# Patient Record
Sex: Male | Born: 1940 | Race: White | Hispanic: No | Marital: Married | Smoking: Former smoker
Health system: Southern US, Community
[De-identification: ages and names within clinical notes are randomized; demographics above are authoritative.]

## PROBLEM LIST (undated history)

## (undated) DIAGNOSIS — Z87442 Personal history of urinary calculi: Secondary | ICD-10-CM

## (undated) DIAGNOSIS — C679 Malignant neoplasm of bladder, unspecified: Secondary | ICD-10-CM

## (undated) DIAGNOSIS — I10 Essential (primary) hypertension: Secondary | ICD-10-CM

## (undated) DIAGNOSIS — K08109 Complete loss of teeth, unspecified cause, unspecified class: Secondary | ICD-10-CM

## (undated) DIAGNOSIS — N189 Chronic kidney disease, unspecified: Secondary | ICD-10-CM

## (undated) DIAGNOSIS — I2510 Atherosclerotic heart disease of native coronary artery without angina pectoris: Secondary | ICD-10-CM

## (undated) DIAGNOSIS — N183 Chronic kidney disease, stage 3 unspecified: Secondary | ICD-10-CM

## (undated) DIAGNOSIS — Z85828 Personal history of other malignant neoplasm of skin: Secondary | ICD-10-CM

## (undated) DIAGNOSIS — D509 Iron deficiency anemia, unspecified: Secondary | ICD-10-CM

## (undated) DIAGNOSIS — E119 Type 2 diabetes mellitus without complications: Secondary | ICD-10-CM

## (undated) DIAGNOSIS — C801 Malignant (primary) neoplasm, unspecified: Secondary | ICD-10-CM

## (undated) DIAGNOSIS — E785 Hyperlipidemia, unspecified: Secondary | ICD-10-CM

## (undated) DIAGNOSIS — N200 Calculus of kidney: Secondary | ICD-10-CM

## (undated) DIAGNOSIS — Z973 Presence of spectacles and contact lenses: Secondary | ICD-10-CM

## (undated) DIAGNOSIS — Z8719 Personal history of other diseases of the digestive system: Secondary | ICD-10-CM

## (undated) DIAGNOSIS — I219 Acute myocardial infarction, unspecified: Secondary | ICD-10-CM

## (undated) DIAGNOSIS — N401 Enlarged prostate with lower urinary tract symptoms: Secondary | ICD-10-CM

## (undated) DIAGNOSIS — H409 Unspecified glaucoma: Secondary | ICD-10-CM

## (undated) DIAGNOSIS — Z8739 Personal history of other diseases of the musculoskeletal system and connective tissue: Secondary | ICD-10-CM

## (undated) DIAGNOSIS — Z9889 Other specified postprocedural states: Secondary | ICD-10-CM

## (undated) HISTORY — DX: Hyperlipidemia, unspecified: E78.5

## (undated) HISTORY — DX: Type 2 diabetes mellitus without complications: E11.9

## (undated) HISTORY — PX: OTHER SURGICAL HISTORY: SHX169

## (undated) HISTORY — DX: Calculus of kidney: N20.0

## (undated) HISTORY — DX: Essential (primary) hypertension: I10

## (undated) HISTORY — DX: Chronic kidney disease, unspecified: N18.9

## (undated) HISTORY — DX: Atherosclerotic heart disease of native coronary artery without angina pectoris: I25.10

## (undated) HISTORY — PX: CARDIAC CATHETERIZATION: SHX172

---
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
## 2002-04-18 ENCOUNTER — Encounter: Payer: Self-pay | Admitting: Internal Medicine

## 2002-04-18 ENCOUNTER — Ambulatory Visit (HOSPITAL_COMMUNITY): Admission: RE | Admit: 2002-04-18 | Discharge: 2002-04-18 | Payer: Self-pay | Admitting: Internal Medicine

## 2002-08-01 ENCOUNTER — Inpatient Hospital Stay (HOSPITAL_COMMUNITY): Admission: EM | Admit: 2002-08-01 | Discharge: 2002-08-06 | Payer: Self-pay | Admitting: Emergency Medicine

## 2002-08-01 ENCOUNTER — Encounter: Payer: Self-pay | Admitting: Emergency Medicine

## 2002-08-01 DIAGNOSIS — I252 Old myocardial infarction: Secondary | ICD-10-CM

## 2002-08-01 HISTORY — DX: Old myocardial infarction: I25.2

## 2002-08-02 DIAGNOSIS — Z955 Presence of coronary angioplasty implant and graft: Secondary | ICD-10-CM

## 2002-08-02 HISTORY — DX: Presence of coronary angioplasty implant and graft: Z95.5

## 2002-08-03 HISTORY — PX: CARDIAC CATHETERIZATION: SHX172

## 2002-08-05 HISTORY — PX: CARDIAC CATHETERIZATION: SHX172

## 2002-10-04 HISTORY — PX: CARDIAC CATHETERIZATION: SHX172

## 2003-06-29 ENCOUNTER — Ambulatory Visit (HOSPITAL_COMMUNITY): Admission: RE | Admit: 2003-06-29 | Discharge: 2003-06-29 | Payer: Self-pay | Admitting: Urology

## 2003-07-05 ENCOUNTER — Ambulatory Visit (HOSPITAL_BASED_OUTPATIENT_CLINIC_OR_DEPARTMENT_OTHER): Admission: RE | Admit: 2003-07-05 | Discharge: 2003-07-05 | Payer: Self-pay | Admitting: Urology

## 2003-07-07 ENCOUNTER — Emergency Department (HOSPITAL_COMMUNITY): Admission: EM | Admit: 2003-07-07 | Discharge: 2003-07-07 | Payer: Self-pay | Admitting: Emergency Medicine

## 2003-07-13 ENCOUNTER — Ambulatory Visit (HOSPITAL_COMMUNITY): Admission: RE | Admit: 2003-07-13 | Discharge: 2003-07-13 | Payer: Self-pay | Admitting: Urology

## 2003-09-01 ENCOUNTER — Ambulatory Visit (HOSPITAL_BASED_OUTPATIENT_CLINIC_OR_DEPARTMENT_OTHER): Admission: RE | Admit: 2003-09-01 | Discharge: 2003-09-01 | Payer: Self-pay | Admitting: Urology

## 2004-06-05 ENCOUNTER — Ambulatory Visit (HOSPITAL_COMMUNITY): Admission: RE | Admit: 2004-06-05 | Discharge: 2004-06-05 | Payer: Self-pay | Admitting: Internal Medicine

## 2005-01-01 ENCOUNTER — Ambulatory Visit (HOSPITAL_COMMUNITY): Admission: RE | Admit: 2005-01-01 | Discharge: 2005-01-01 | Payer: Self-pay | Admitting: *Deleted

## 2005-01-09 ENCOUNTER — Ambulatory Visit (HOSPITAL_COMMUNITY): Admission: RE | Admit: 2005-01-09 | Discharge: 2005-01-09 | Payer: Self-pay | Admitting: Cardiology

## 2005-08-04 ENCOUNTER — Emergency Department (HOSPITAL_COMMUNITY): Admission: EM | Admit: 2005-08-04 | Discharge: 2005-08-04 | Payer: Self-pay | Admitting: Emergency Medicine

## 2006-03-30 HISTORY — PX: TRANSTHORACIC ECHOCARDIOGRAM: SHX275

## 2009-09-07 HISTORY — PX: CARDIOVASCULAR STRESS TEST: SHX262

## 2009-09-07 HISTORY — PX: OTHER SURGICAL HISTORY: SHX169

## 2011-11-11 ENCOUNTER — Ambulatory Visit (HOSPITAL_COMMUNITY)
Admission: RE | Admit: 2011-11-11 | Discharge: 2011-11-11 | Disposition: A | Discharge status: Home / Self Care | Payer: Medicare Other | Source: Ambulatory Visit | Attending: Orthopedic Surgery | Admitting: Orthopedic Surgery

## 2011-11-11 DIAGNOSIS — IMO0001 Reserved for inherently not codable concepts without codable children: Secondary | ICD-10-CM | POA: Insufficient documentation

## 2011-11-11 DIAGNOSIS — M6281 Muscle weakness (generalized): Secondary | ICD-10-CM | POA: Insufficient documentation

## 2011-11-11 DIAGNOSIS — M25669 Stiffness of unspecified knee, not elsewhere classified: Secondary | ICD-10-CM | POA: Insufficient documentation

## 2011-11-11 DIAGNOSIS — M25569 Pain in unspecified knee: Secondary | ICD-10-CM | POA: Insufficient documentation

## 2011-11-11 NOTE — Evaluation (Addendum)
Physical Therapy Evaluation  Patient Details  Name: Craig Taylor MRN: [id number] Date of Birth: 08/13/1940  Today's Date: 11/11/2011 Time: 7829-5621 Time Calculation (min): 47 min Charges: 1 eval Visit#: 1  of 7   Re-eval: 12/04/11 (G Code: CK, Goal: CI) Assessment Diagnosis: Rt knee pain Next MD Visit: 3 weeks  Subjective Symptoms/Limitations Symptoms: Pt is referred to to PT secondary to an LCL injury.  He reports that the last week in February he twisted his knee.  Pain Range 0-9/10.  Alleviating: Advil, heat.  Aggrevating: Movement,  Nature: Throbbing, occasional sharp pains with quick movments.  How long can you sit comfortably?: Able to sit as long as he can straighten it out.  Has pain when switching it over to the brake.  How long can you stand comfortably?: With Advil: 1 hour, without advil 10 minutes How long can you walk comfortably?: Takes him awhile to start moving.  walk about 10 minutes  Special Tests: RLE: Lax end feel with Varus stress, Unable to relax to perform mcmurrary's or lachmas.  Pt reports that he had an MRI and x-ray and both where normal.  Pain Assessment Currently in Pain?: Yes Pain Score:   6 Pain Location: Knee Pain Orientation: Right Pain Type: Acute pain  Prior Functioning  Home Living Lives With: Spouse Home Layout: Two level Alternate Level Stairs-Number of Steps: 15  Prior Function Able to Take Stairs?: Yes (step to pattern) Leisure: Hobbies-yes (Comment) Comments: He enjoys hunting, fishing and other outdoor activities.   Cognition/Observation Observation/Other Assessments Observations: Poor motor control to gluteus medius, has increased gastroc and hamstring activation when attmepting to use R gluteus medius.   Favors RLE when going from Sit <> Stand  Sensation/Coordination/Flexibility/Functional Tests Functional Tests Functional Tests: Lower Extremity Functional Scale (LEFS): 28/80.  Assessment RLE AROM (degrees) Right Knee  Extension: 7  Right Knee Flexion: 125  RLE Strength Right Hip Flexion: 4/5; Extension: 3/5; ABduction: 3/5; ADduction: 3+/5 Right Knee Flexion: 4/5; Extension: 3+/5 Palpation Palpation: Pain and tenderness over R LCL.  Lax end feel with varus stress test.   Mobility/Balance  Ambulation/Gait Ambulation/Gait: Yes Gait Pattern: Antalgic;Lateral trunk lean to right;Right foot flat Static Standing Balance Single Leg Stance - Right Leg: 2  (impaired ankle strategy. Decreased awareness) Single Leg Stance - Left Leg: 3  Tandem Stance - Right Leg: 6  (impaired ankle strategy. favoring R LE) Tandem Stance - Left Leg: 8  (impaired ankle strategy.) Rhomberg - Eyes Closed: 10  (increased swa4) \  Exercise/Treatments Supine Quad Sets: 5 reps (10 sec holds) Straight Leg Raises:  (increased pain when attempting) Sidelying Clams: L S/L.  NMR to improve gluteus medius activiation using TC's and VC's.  After established able to complete 2x10 sec   Physical Therapy Assessment and Plan PT Assessment and Plan Clinical Impression Statement: Pt is referred to PT secondary to L LCL strain.  After examiniation it was found he has current impairments including increased knee pain, decreased leg strength, decreased L knee AROM, decreased hip/leg motor control, impaired balance and impaired percieved functional ability which is limiting him in participating in his ADL's and IADL's including outdoor activities.  Pt will benefit from skilled OP PT in order to address above impairments in order to reach functional goals.   Rehab Potential: Good PT Frequency: Min 2X/week PT Duration:  (3 weeks (7 visits)) PT Treatment/Interventions: Gait training;Stair training;Functional mobility training;Therapeutic activities;Therapeutic exercise;Neuromuscular re-education;Patient/family education;Other (comment) (Manual techniques, Heat/Cold modalities PRN) PT Plan: Bike for Warm up and  ROM, focus on gait training, glutes  medius activation, functional squats, heel/toe raises. stair training when able and has decreased pain.    Goals Home Exercise Program Pt will Perform Home Exercise Program: Independently PT Short Term Goals Time to Complete Short Term Goals: 3 weeks PT Short Term Goal 1: Pt will report pain less than 4/10 for 50% of his day.  PT Short Term Goal 2: Pt will improve his balance and deomonstrate R SLS with proper mechanics on static surface x20 sec PT Short Term Goal 3: Pt will improve his leg strength to 5/5 throughout in order to tolerate walking for 30 minutes on uneven surfaces in order to return to hunting.  PT Short Term Goal 4: Pt will improve leg AROM 0-125 in order to ambulate with proper gait mechanics.  PT Short Term Goal 5: Pt will improve his functional strength and demonstrate ascending and descending 15 stairs with 1 handrail with reciprocal pattern.  Additional PT Short Term Goals?: Yes PT Short Term Goal 6: Pt will improve his LEFS score to 40/80 for improved percieved functional ability.   Problem List Patient Active Problem List  Diagnoses  . Pain in joint, lower leg  . Stiffness of joint, not elsewhere classified, lower leg    PT Plan of Care PT Home Exercise Plan: Clams 2x10 sec holds, Quad Sets 10x10 sec holds.  Educated pt to perform heel slides for 30 sec-1 minute before getting up to improve synovial fluid.  Consulted and Agree with Plan of Care: Patient  GP  Functional Reporting Modifier  Current Status  [phone] - Mobility: Walking & Moving Around CK - At least 40% but less than 60% impaired, limited or restricted  Goal Status  [id number] - Mobility: [place] & Moving Around CI - At least 1% but less than 20% impaired, limited or restricted   Amilliana Hayworth 11/11/2011, 12:15 PM  Physician Documentation Your signature is required to indicate approval of the treatment plan as stated above.  Please sign and either send electronically or make a copy of this report for your  files and return this physician signed original.   Please mark one 1.__approve of plan  2. ___approve of plan with the following conditions.   ______________________________                                                          _____________________ Physician Signature                                                                                                             Date

## 2011-11-18 ENCOUNTER — Ambulatory Visit (HOSPITAL_COMMUNITY)
Admission: RE | Admit: 2011-11-18 | Discharge: 2011-11-18 | Disposition: A | Discharge status: Home / Self Care | Payer: Medicare Other | Source: Ambulatory Visit | Attending: Orthopedic Surgery | Admitting: Orthopedic Surgery

## 2011-11-18 NOTE — Progress Notes (Signed)
Physical Therapy Treatment Patient Details  Name: Craig Taylor MRN: [id number] Date of Birth: Oct 15, 1940  Today's Date: 11/18/2011 Time: 9629-5284 Time Calculation (min): 45 min Visit#: 2  of 7   Re-eval: 12/04/11 Charges: therex 25', estim X 1 unit, MHP X 1 unit    Subjective: Symptoms/Limitations Symptoms: Pt. reports he is in severe pain today, 8/10 lateral R knee and has increased antalgia.  Pt reports he has been using heat to decrease his pain. Pain Assessment Currently in Pain?: Yes Pain Score:   8 Pain Location: Knee Pain Orientation: Right;Lateral  Exercises Instructed by Trilby Leaver, SPTA under the direction of Darragh Nay Bascom Levels, PTA/CI. Exercise/Treatments Supine Quad Sets: 10 reps;Limitations Quad Sets Limitations: assist to concentric and hold Sidelying Clams: L S/L.  NMR to improve gluteus medius activiation using TC's and VC's.  After established able to complete 2x10 sec  Prone  Hamstring Curl: 10 reps Hip Extension: 10 reps   Modalities Modalities: Electrical Stimulation;Moist Heat Manual Therapy Manual Therapy: Joint mobilization Joint Mobilization: patellar and fibular head mobs Museum/gallery exhibitions officer Stimulation Location: R knee, IFES Electrical Stimulation Action: IFES to decrease pain, hi/lo sweep intensity 30 for 10 minutes with MHP Electrical Stimulation Goals: Pain  Physical Therapy Assessment and Plan PT Assessment and Plan Clinical Impression Statement: Pt. with increased pain since yesterday without known cause.  Encouraged pt. to use AD while pain is high/decrease antalgia.  Held standing exercises due to high pain.  Pt. able to complete all mat activities.  Pt. with most difficulty with quad sets, very weak/atrophy of R quad.  Added IFES with heat (works better for patient) to help decrease pain.  Pain 3/10 after session. PT Plan: Add standing exercises next visit if pain is decreased; Ensure pt. is using AD and peforming  correct gait sequence.     PT - End of Session Activity Tolerance: Patient tolerated treatment well General Behavior During Session: [facility] for tasks performed Cognition: [facility] for tasks performed   Brittain Smithey B. Bascom Levels, PTA 11/18/2011, 10:53 AM

## 2011-11-21 ENCOUNTER — Telehealth (HOSPITAL_COMMUNITY): Payer: Self-pay | Admitting: Physical Therapy

## 2011-11-21 ENCOUNTER — Ambulatory Visit (HOSPITAL_COMMUNITY): Payer: Medicare Other | Admitting: Physical Therapy

## 2011-11-25 ENCOUNTER — Ambulatory Visit (HOSPITAL_COMMUNITY)
Admission: RE | Admit: 2011-11-25 | Discharge: 2011-11-25 | Disposition: A | Discharge status: Home / Self Care | Payer: Medicare Other | Source: Ambulatory Visit | Attending: Internal Medicine | Admitting: Internal Medicine

## 2011-11-25 NOTE — Progress Notes (Signed)
Physical Therapy Treatment Patient Details  Name: Craig Taylor MRN: [id number] Date of Birth: 02-Jan-1941  Today's Date: 11/25/2011 Time: 8657-8469 Time Calculation (min): 45 min Visit#: 3  of 7   Re-eval: 12/04/11 Charges:  therex 8', manual 20', IFES with MHP X 1 unit each    Subjective: Symptoms/Limitations Symptoms: Pt. reports he went to the MD after last visit and he gave him a shot in his lateral knee.  Pt. states he did OK for a couple days but his pain has returned and currently with 8/10 pain that extends down into his lateral distal LE and calf area.  Pt. states he returns to MD tomorrow to discuss options if no better.  Pt currently using wooden SPC with missing tip.  PT technician took SPC down to maintenance to have a tip placed on end for safety. Pain Assessment Currently in Pain?: Yes Pain Score:   8 Pain Location: Knee Pain Orientation: Right  Exercises Instructed by Trilby Leaver, SPTA under the direction of Karl Erway Bascom Levels, PTA/CI. Exercise/Treatments Stretches Theme park manager: 3 reps;20 seconds;Limitations Theme park manager Limitations: using sheet, self stretch and PROM Prone  Hamstring Curl: Limitations Hamstring Curl Limitations: too painful   Modalities Modalities: Electrical Stimulation;Moist Heat Manual Therapy Manual Therapy: Myofascial release Joint Mobilization: Fib head mobs Myofascial Release: lateral LE and calf area. Pharmacologist Location: R knee, IFES Electrical Stimulation Action: IFES to decrease pain, hi/lo sweep intensity 30 for 10 minutes with MHP Electrical Stimulation Goals: Pain  Physical Therapy Assessment and Plan PT Assessment and Plan Clinical Impression Statement: Pt. with continued pain despite shot/use of of AD.  Attempted some mat activities, however too painful.  Pt. with noted tight gastroc and lateral LE/ITB.  Performed Passive stretch to these mm as well as instructed with home gastroc  stretch.  MFR/massage performed to distal LE and calf to decrease adhesions/tightness.  Pt. ambulating with SPC using correct gait sequence.  Overall improved gait with pain reduced to 4/10 at end of session. PT Plan: Await results from MD appt. tomorrow; Continue to progress therex if returns.      PT - End of Session Activity Tolerance: Patient limited by pain General Behavior During Session: [facility] for tasks performed Cognition: [facility] for tasks performed   Trilby Leaver, SPTA/ Joevon Holliman B. Bascom Levels, PTA 11/25/2011, 10:54 AM

## 2011-11-28 ENCOUNTER — Ambulatory Visit (HOSPITAL_COMMUNITY)
Admission: RE | Admit: 2011-11-28 | Discharge: 2011-11-28 | Disposition: A | Discharge status: Home / Self Care | Payer: Medicare Other | Source: Ambulatory Visit

## 2011-11-28 NOTE — Progress Notes (Signed)
Physical Therapy Treatment Patient Details  Name: Craig Taylor MRN: [id number] Date of Birth: Aug 29, 1940  Today's Date: 11/28/2011 Time: 4098-1191 Time Calculation (min): 46 min Visit#: 4  of 7   Re-eval: 12/04/11  Charge: therex 38 min [place] 8 min  Subjective: Symptoms/Limitations Symptoms: Pt entered dept with MD order for iontophoresis for R knee tendonitis, pt stated pain 7-8/10. Pain Assessment Currently in Pain?: Yes Pain Score:   8 Pain Location: Knee Pain Orientation: Right  Objective:  Exercise/Treatments Stretches Active Hamstring Stretch: 3 reps;30 seconds Gastroc Stretch: 3 reps;30 seconds;Limitations Gastroc Stretch Limitations: supine with rope, standing with slant board Aerobic Stationary Bike: 5' @ 1.0  Supine Quad Sets: 10 reps;Limitations Straight Leg Raises: 10 reps Sidelying Clams: L S/L.  NMR to improve gluteus medius activiation using TC's and VC's.  10x 10" with noted substitution, vc's to reduce   Modalities Modalities: Ultrasound Ultrasound Ultrasound Location: R lateral knee superior to fibular head Ultrasound Parameters: .8  w/cm2 pulsed Ultrasound Goals: Pain  Physical Therapy Assessment and Plan PT Assessment and Plan Clinical Impression Statement: Changed modality to [place] pulsed per Annett Fabian, PT.  Pt stated no pain lateral knee following [place] but did c/o tightness in calf.  Pt with better gait mechanics noted at end of session.  Pt able to complete supine and sidelying exercises with no c/o, multimodal cueing required with clam to utilize glut med without substitions from h/s and quad.  Quad sets noted rapid superficial contractions unable to hold 5 seconds due to weakness. PT Plan: Assess pain relief following change in modality    Goals    Problem List Patient Active Problem List  Diagnoses  . Pain in joint, lower leg  . Stiffness of joint, not elsewhere classified, lower leg    PT - End of Session Activity Tolerance:  Patient tolerated treatment well General Behavior During Session: [facility] for tasks performed Cognition: [facility] for tasks performed  GP No functional reporting required  Juel Burrow, PTA 11/28/2011, 10:03 AM

## 2011-12-02 ENCOUNTER — Ambulatory Visit (HOSPITAL_COMMUNITY)
Admission: RE | Admit: 2011-12-02 | Discharge: 2011-12-02 | Disposition: A | Discharge status: Home / Self Care | Payer: Medicare Other | Source: Ambulatory Visit | Attending: Internal Medicine | Admitting: Internal Medicine

## 2011-12-02 NOTE — Progress Notes (Signed)
Physical Therapy Treatment Patient Details  Name: Craig Taylor MRN: [id number] Date of Birth: 03-09-1941  Today's Date: 12/02/2011 Time: 0800-0840 Time Calculation (min): 40 min Charges: [place]: 8', Therex: 63' Visit#: 5  of 7   Re-eval: 12/04/11    Subjective: Symptoms/Limitations Symptoms: Pt enters with significant limp and reports that his pain is a 9/10 this morning Pain Assessment Currently in Pain?: Yes Pain Score:   9 Pain Location: Knee Pain Orientation: Right  Exercise/Treatments  Modalities Modalities: Ultrasound Manual Therapy Manual Therapy: Myofascial release Joint Mobilization: Grade II-IV to proximal fib head AP and PA directions in supine and L S/L position Myofascial Release: lateral LE and calf area Ultrasound Ultrasound Location: R lateral knee over LCL Ultrasound Parameters: .8 w/cm2 continous Ultrasound Goals: Pain  Physical Therapy Assessment and Plan PT Assessment and Plan Clinical Impression Statement: Today's treatment sessions focused on improving fascial mobility through use of [place] and manual techniques to decrease overall pain.  Pt reports pain 4/10 after treatment, however continues to demonstrate antalgic gait with L lateral trunk lean and poor foot mechanics.  Continues to have greatest limitation with pain.  PT Plan: Re-eval next visit.    Goals    Problem List Patient Active Problem List  Diagnoses  . Pain in joint, lower leg  . Stiffness of joint, not elsewhere classified, lower leg       GP No functional reporting required  Josy Peaden 12/02/2011, 8:44 AM

## 2011-12-04 ENCOUNTER — Ambulatory Visit (HOSPITAL_COMMUNITY)
Admission: RE | Admit: 2011-12-04 | Discharge: 2011-12-04 | Disposition: A | Discharge status: Home / Self Care | Payer: Medicare Other | Source: Ambulatory Visit | Attending: Internal Medicine | Admitting: Internal Medicine

## 2011-12-04 NOTE — Evaluation (Signed)
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  His pain on average is 5/10 How long can you stand comfortably?: 10 minute How long can you walk comfortably?: 15 minutes  Pain Assessment Currently in Pain?: Yes Pain Score:   5 Pain Location: Knee Pain Orientation: Right Pain Type: Acute pain  Sensation/Coordination/Flexibility/Functional Tests Functional Tests Functional Tests: LEFS: 35/80  Assessment RLE AROM (degrees) Right Knee Extension 0-130: 3  (was 7) Right Knee Flexion 0-140: 128  (was 125) RLE Strength Right Hip Flexion: 4/5 (was 4/5) Right Hip Extension: 4/5 (wa 3/5) Right Hip ABduction: 4/5 (was 3/5) Right Hip ADduction: 4/5 (was 3+/5) Right Knee Flexion: 5/5 (was 4/5) Right Knee Extension: 4/5 (was 3+/5)  Mobility/Balance  Ambulation/Gait Ambulation/Gait: Yes Gait Pattern: Antalgic;Lateral trunk lean to right;Right foot flat (Decreased knee extension during stance)   Exercise/Treatments Standing Terminal Knee Extension: 10 reps;Other (comment) (5 sec holds) SLS: 30 sec w/B HHA Supine Short Arc Quad Sets: 10 reps;Other (comment) (10 sec holds) Sidelying Clams: L S/L.  NMR to improve gluteus medius activiation using TC's and VC's.  10x 10" with noted substitution, vc's to reduce 3x10 sec after NMR  Modalities Modalities: Ultrasound Manual Therapy Manual Therapy: Other (comment) Joint Mobilization: Grade II-IV to proximal fib head AP and PA directions in supine and patellar mobs Myofascial Release: To lateral knee in multiple locations Ultrasound Ultrasound Location: R lateral knee  over LCL  Ultrasound Parameters: .8 w/cm2 continous  Ultrasound Goals: Pain  Physical Therapy Assessment and Plan PT Assessment and Plan Clinical Impression Statement: Mr. Dowis has attended 6 OP PT visits.  He is progressing slowly towards his goals secondary to a recent increase in knee pain.  However over the past two visits he has decreased his pain w/use of ultrasound and manual treatment.  He continues to have decreased strength (that has improved moderatly) impaired balance, gait abnormalities.  However has improved in his AROM.  He will continue to benefit from skilled OP PT in order to reach functional goals.  Rehab Potential: Good PT Frequency: Min 2X/week PT Duration: 4 weeks PT Treatment/Interventions: Gait training;Stair training;Functional mobility training;Therapeutic activities;Therapeutic exercise;Neuromuscular re-education;Cognitive remediation;Balance training;Other (comment);Patient/family education (Manual techniques and Modalities including Ionto per MD orde) PT Plan: Cont to decrease pain and improve functional knee extension strength while controling pain.     Goals Home Exercise Program Pt will Perform Home Exercise Program: Independently PT Goal: Perform Home Exercise Program - Progress: Met PT Short Term Goals Time to Complete Short Term Goals: 4 weeks PT Short Term Goal 1: Pt will report pain less than 4/10 for 50% of his day.  PT Short Term Goal 1 - Progress: Progressing toward goal PT Short Term Goal 2: Pt will improve his balance and deomonstrate R SLS with proper mechanics on static surface x20 sec PT Short Term Goal 2 - Progress: Not met PT Short Term Goal 3: Pt will improve his leg strength to 5/5 throughout in order to tolerate walking for 30 minutes on uneven surfaces in order to return to hunting.  PT Short Term Goal 3 - Progress: Progressing toward  goal PT Short Term Goal 4: Pt will improve leg AROM 0-125 in order to ambulate with proper gait  mechanics.  PT Short Term Goal 4 - Progress: Progressing toward goal PT Short Term Goal 5: Pt will improve his functional strength and demonstrate ascending and descending 15 stairs with 1 handrail with reciprocal pattern.  PT Short Term Goal 5 - Progress: Not met Additional PT Short Term Goals?: Yes PT Short Term Goal 6: Pt will improve his LEFS score to 40/80 for improved percieved functional ability.  PT Short Term Goal 6 - Progress: Progressing toward goal  Problem List Patient Active Problem List  Diagnoses  . Pain in joint, lower leg  . Stiffness of joint, not elsewhere classified, lower leg    PT Plan of Care PT Home Exercise Plan: SAQ's and TKE' w/blue band Consulted and Agree with Plan of Care: Patient  Shanekqua Schaper 12/04/2011, 10:24 AM  Physician Documentation Your signature is required to indicate approval of the treatment plan as stated above.  Please sign and either send electronically or make a copy of this report for your files and return this physician signed original.   Please mark one 1.__approve of plan  2. ___approve of plan with the following conditions.   ______________________________                                                          _____________________ Physician Signature                                                                                                             Date

## 2011-12-09 ENCOUNTER — Ambulatory Visit (HOSPITAL_COMMUNITY): Payer: Medicare Other | Admitting: Physical Therapy

## 2011-12-09 ENCOUNTER — Telehealth (HOSPITAL_COMMUNITY): Payer: Self-pay

## 2011-12-16 ENCOUNTER — Ambulatory Visit (HOSPITAL_COMMUNITY): Payer: Medicare Other

## 2011-12-18 ENCOUNTER — Ambulatory Visit (HOSPITAL_COMMUNITY): Payer: Medicare Other | Admitting: Physical Therapy

## 2011-12-23 ENCOUNTER — Ambulatory Visit (HOSPITAL_COMMUNITY): Payer: Medicare Other

## 2011-12-25 ENCOUNTER — Ambulatory Visit (HOSPITAL_COMMUNITY): Payer: Medicare Other

## 2012-03-29 ENCOUNTER — Other Ambulatory Visit (HOSPITAL_COMMUNITY): Payer: Self-pay | Admitting: Internal Medicine

## 2012-03-29 ENCOUNTER — Ambulatory Visit (HOSPITAL_COMMUNITY)
Admission: RE | Admit: 2012-03-29 | Discharge: 2012-03-29 | Disposition: A | Discharge status: Home / Self Care | Payer: Medicare Other | Source: Ambulatory Visit | Attending: Internal Medicine | Admitting: Internal Medicine

## 2012-03-29 DIAGNOSIS — N200 Calculus of kidney: Secondary | ICD-10-CM | POA: Insufficient documentation

## 2012-03-29 DIAGNOSIS — K429 Umbilical hernia without obstruction or gangrene: Secondary | ICD-10-CM | POA: Insufficient documentation

## 2012-03-29 DIAGNOSIS — R1032 Left lower quadrant pain: Secondary | ICD-10-CM | POA: Insufficient documentation

## 2012-03-29 DIAGNOSIS — R109 Unspecified abdominal pain: Secondary | ICD-10-CM

## 2013-02-01 ENCOUNTER — Other Ambulatory Visit: Payer: Self-pay | Admitting: *Deleted

## 2013-02-01 MED ORDER — FENOFIBRATE MICRONIZED 134 MG PO CAPS: 134.0000 mg | ORAL_CAPSULE | Freq: Every day | ORAL | Status: DC

## 2013-02-01 MED ORDER — METOPROLOL TARTRATE 50 MG PO TABS: 50.0000 mg | ORAL_TABLET | Freq: Two times a day (BID) | ORAL | Status: DC

## 2013-02-01 MED ORDER — SIMVASTATIN 80 MG PO TABS: 80.0000 mg | ORAL_TABLET | Freq: Every day | ORAL | Status: DC

## 2013-02-01 NOTE — Telephone Encounter (Signed)
Simvastatin,Fenofibrate  & Metoprolol refilled electronically

## 2013-02-02 ENCOUNTER — Other Ambulatory Visit: Payer: Self-pay

## 2013-02-02 MED ORDER — NIASPAN 1000 MG PO TBCR: 1000.0000 mg | EXTENDED_RELEASE_TABLET | Freq: Every day | ORAL | Status: DC

## 2013-02-02 NOTE — Telephone Encounter (Signed)
Rx was sent to pharmacy electronically. 

## 2013-02-07 ENCOUNTER — Other Ambulatory Visit: Payer: Self-pay

## 2013-02-07 MED ORDER — CLOPIDOGREL BISULFATE 75 MG PO TABS: 75.0000 mg | ORAL_TABLET | Freq: Every day | ORAL | Status: DC

## 2013-02-07 NOTE — Telephone Encounter (Signed)
Rx was sent to pharmacy electronically. 

## 2013-08-03 ENCOUNTER — Encounter: Payer: Self-pay | Admitting: Cardiovascular Disease

## 2013-08-08 ENCOUNTER — Encounter: Payer: Self-pay | Admitting: Cardiovascular Disease

## 2013-08-08 ENCOUNTER — Ambulatory Visit (INDEPENDENT_AMBULATORY_CARE_PROVIDER_SITE_OTHER): Payer: Medicare Other | Admitting: Cardiovascular Disease

## 2013-08-08 VITALS — BP 130/64 | HR 67 | Ht 77.0 in | Wt 271.0 lb

## 2013-08-08 DIAGNOSIS — I10 Essential (primary) hypertension: Secondary | ICD-10-CM

## 2013-08-08 DIAGNOSIS — E119 Type 2 diabetes mellitus without complications: Secondary | ICD-10-CM | POA: Insufficient documentation

## 2013-08-08 DIAGNOSIS — I2510 Atherosclerotic heart disease of native coronary artery without angina pectoris: Secondary | ICD-10-CM

## 2013-08-08 DIAGNOSIS — Z79899 Other long term (current) drug therapy: Secondary | ICD-10-CM

## 2013-08-08 DIAGNOSIS — E785 Hyperlipidemia, unspecified: Secondary | ICD-10-CM

## 2013-08-08 LAB — HEPATIC FUNCTION PANEL
ALT: 17 U/L (ref 0–53)
Albumin: 4.4 g/dL (ref 3.5–5.2)
Alkaline Phosphatase: 32 U/L — ABNORMAL LOW (ref 39–117)
Indirect Bilirubin: 0.4 mg/dL (ref 0.0–0.9)
Total Bilirubin: 0.5 mg/dL (ref 0.3–1.2)

## 2013-08-08 LAB — LIPID PANEL
Cholesterol: 151 mg/dL (ref 0–200)
HDL: 30 mg/dL — ABNORMAL LOW (ref >39)
Total CHOL/HDL Ratio: 5 Ratio

## 2013-08-08 NOTE — Progress Notes (Signed)
08/08/2013 Craig Taylor   April 23, 1941  [id number]  Primary Physician Craig Smiles., MD Primary Cardiologist: Runell Gess MD Craig Taylor   HPI:  The patient is a 72 year old moderately-overweight married Caucasian male, father of 2, whom I last saw 6 months ago. He has a history of RCA stenting by me August 02, 2002. He was recatheterized October 04, 2002, showing patent stents with a 30% to 40% AV groove circumflex stenosis and EF of 50% with mild inferior hypokalemia. He denies chest pain or shortness of breath. His other problems include hypertension, hyperlipidemia, and non-insulin-requiring diabetes. Auscultated a bruit that required Dopplers on him 3 years ago that showed no evidence of ICA stenosis. His last lipid profile, performed 18 months ago, revealed a total cholesterol of 135, LDL of 83, and HDL of 31.since I saw him back in January he completely asymptomatic. He specifically denies chest pain or shortness of breath. I'm going to check a repeat lipid profile.    Current Outpatient Prescriptions  Medication Sig Dispense Refill  . aspirin EC 81 MG tablet Take 81 mg by mouth daily.      . Cinnamon 500 MG capsule Take 500 mg by mouth daily.      . clopidogrel (PLAVIX) 75 MG tablet Take 1 tablet (75 mg total) by mouth daily.  90 tablet  2  . enalapril (VASOTEC) 2.5 MG tablet Take 2.5 mg by mouth daily.      . fenofibrate micronized (LOFIBRA) 134 MG capsule Take 1 capsule (134 mg total) by mouth daily before breakfast.  90 capsule  2  . gemfibrozil (LOPID) 600 MG tablet Take 600 mg by mouth 2 (two) times daily before a meal.      . metFORMIN (GLUCOPHAGE) 1000 MG tablet Take 1,000 mg by mouth 2 (two) times daily with a meal.      . metoprolol (LOPRESSOR) 50 MG tablet Take 1 tablet (50 mg total) by mouth 2 (two) times daily.  180 tablet  2  . NIASPAN 1000 MG CR tablet Take 1 tablet (1,000 mg total) by mouth at bedtime.  90 tablet  2  . Omega-3 Fatty Acids  (FISH OIL PO) Take 2 capsules by mouth daily.      . simvastatin (ZOCOR) 80 MG tablet Take 1 tablet (80 mg total) by mouth at bedtime.  90 tablet  2   No current facility-administered medications for this visit.    Allergies  Allergen Reactions  . Penicillins   . Voltaren [Diclofenac Sodium]     History   Social History  . Marital Status: Married    Spouse Name: N/A    Number of Children: N/A  . Years of Education: N/A   Occupational History  . Not on file.   Social History Main Topics  . Smoking status: Former Smoker    Quit date: 08/09/1983  . Smokeless tobacco: Not on file     Comment: quit chewing tobacco aug 2014  . Alcohol Use: Not on file  . Drug Use: Not on file  . Sexual Activity: Not on file   Other Topics Concern  . Not on file   Social History Narrative  . No narrative on file     Review of Systems: General: negative for chills, fever, night sweats or weight changes.  Cardiovascular: negative for chest pain, dyspnea on exertion, edema, orthopnea, palpitations, paroxysmal nocturnal dyspnea or shortness of breath Dermatological: negative for rash Respiratory: negative for cough or wheezing Urologic: negative  for hematuria Abdominal: negative for nausea, vomiting, diarrhea, bright red blood per rectum, melena, or hematemesis Neurologic: negative for visual changes, syncope, or dizziness All other systems reviewed and are otherwise negative except as noted above.    Blood pressure 130/64, pulse 67, height 6\' 5"  (1.956 m), weight 271 lb (122.925 kg).  General appearance: alert and no distress Neck: no adenopathy, no carotid bruit, no JVD, supple, symmetrical, trachea midline and thyroid not enlarged, symmetric, no tenderness/mass/nodules Lungs: clear to auscultation bilaterally Heart: regular rate and rhythm, S1, S2 normal, no murmur, click, rub or gallop Extremities: extremities normal, atraumatic, no cyanosis or edema  EKG normal sinus rhythm at 57  with RSR prime in V1 and early  R. Wave  transition  ASSESSMENT AND PLAN:   Coronary artery disease Status post RCA stenting by myself 08/02/02 of the proximal dominant RCA and PDA. He had a 30-40% mid AV groove stenosis with normal LV function and mild inferior hypokinesia. He was recathed 10/04/02 revealing patent stents. His last Myoview performed on 09/07/09 showed mild inferior ischemia versus scar/diaphragmatic attenuation. He denies chest pain or shortness of breath.  Essential hypertension Controlled on current medications  Hyperlipidemia On statin therapy. We will recheck a lipid and liver profile      Runell Gess MD [facility], [facility] 08/08/2013 9:35 AM

## 2013-08-08 NOTE — Assessment & Plan Note (Signed)
Status post RCA stenting by myself 08/02/02 of the proximal dominant RCA and PDA. He had a 30-40% mid AV groove stenosis with normal LV function and mild inferior hypokinesia. He was recathed 10/04/02 revealing patent stents. His last Myoview performed on 09/07/09 showed mild inferior ischemia versus scar/diaphragmatic attenuation. He denies chest pain or shortness of breath.

## 2013-08-08 NOTE — Patient Instructions (Signed)
Dr Allyson Sabal has ordered FASTING lab work.  Dr. Allyson Sabal wants you to follow-up in 1 year.  You will receive a reminder letter in the mail two months in advance. If you don't receive a letter, please call our office to schedule the follow-up appointment.

## 2013-08-08 NOTE — Assessment & Plan Note (Signed)
On statin therapy. We will recheck a lipid and liver profile 

## 2013-08-08 NOTE — Assessment & Plan Note (Signed)
Controlled on current medications 

## 2013-08-09 ENCOUNTER — Telehealth: Payer: Self-pay | Admitting: Cardiovascular Disease

## 2013-08-09 NOTE — Telephone Encounter (Signed)
Results not reviewed and no documentation that pt to call back today for results.  Message forwarded to K. Petra Kuba, RN to discuss w/ Dr. Allyson Sabal.

## 2013-08-09 NOTE — Telephone Encounter (Signed)
Was told to call today to get lab results from yesterday.

## 2013-08-12 NOTE — Telephone Encounter (Signed)
Still has not gotten results of bloodwork  Please call

## 2013-08-12 NOTE — Telephone Encounter (Signed)
Called patient to inform that labs have not been reviewed. Informed that Dr. Gwenlyn Found would not be in office until Tues Jan 6 - message sent to Curt Bears, [place]

## 2013-08-19 ENCOUNTER — Telehealth: Payer: Self-pay | Admitting: *Deleted

## 2013-08-19 DIAGNOSIS — Z79899 Other long term (current) drug therapy: Secondary | ICD-10-CM

## 2013-08-19 DIAGNOSIS — E785 Hyperlipidemia, unspecified: Secondary | ICD-10-CM

## 2013-08-19 MED ORDER — ATORVASTATIN CALCIUM 80 MG PO TABS: 80.0000 mg | ORAL_TABLET | Freq: Every day | ORAL | Status: DC

## 2013-08-19 NOTE — Telephone Encounter (Signed)
Results reviewed with patient

## 2013-08-19 NOTE — Telephone Encounter (Signed)
Message copied by Chauncy Lean on Fri Aug 19, 2013 11:45 AM ------      Message from: Lorretta Harp      Created: Mon Aug 15, 2013  6:27 PM       Change Simva 80 to Atorva 80 and re check ------

## 2013-08-19 NOTE — Telephone Encounter (Signed)
Order sent to pharmacy for atorvastatin and repeat lab slip mailed

## 2013-08-31 ENCOUNTER — Other Ambulatory Visit: Payer: Self-pay | Admitting: *Deleted

## 2013-08-31 DIAGNOSIS — E785 Hyperlipidemia, unspecified: Secondary | ICD-10-CM

## 2013-08-31 MED ORDER — ATORVASTATIN CALCIUM 80 MG PO TABS
80.0000 mg | ORAL_TABLET | Freq: Every day | ORAL | Status: DC
Start: 2013-08-31 — End: 2013-12-08

## 2013-08-31 MED ORDER — METOPROLOL TARTRATE 50 MG PO TABS: 50.0000 mg | ORAL_TABLET | Freq: Two times a day (BID) | ORAL | Status: DC

## 2013-09-07 ENCOUNTER — Telehealth: Payer: Self-pay

## 2013-09-07 NOTE — Telephone Encounter (Signed)
Pt is on recall list for Oct 2015 to be scheduled for his 10 year colonoscopy

## 2013-09-07 NOTE — Telephone Encounter (Signed)
Pt was referred by Collene Mares PA/ Dr. Hilma Favors for screening colonoscopy. His last one was 06/05/2004 by Dr.Rourk and his next was recommended in 10 years.   Pt came by the office to check on it. He is not having any GI problems. No new family hx of colon cancer. He said ok just to give him a call in 05/2014 when it is actually due for insurance purposes.   Will route to Manuela Schwartz to make sure he is on recall.

## 2013-09-07 NOTE — Telephone Encounter (Signed)
If the reason for colonoscopy referral is truly screening, then we certainly could wait until October of this year.  Please let [facility] know of the plan

## 2013-09-16 NOTE — Telephone Encounter (Signed)
Fax to Dr. Hilma Favors on 09/07/2013 of the plan.

## 2013-09-19 ENCOUNTER — Emergency Department (HOSPITAL_COMMUNITY): Payer: Medicare Other

## 2013-09-19 ENCOUNTER — Encounter (HOSPITAL_COMMUNITY): Payer: Self-pay | Admitting: Emergency Medicine

## 2013-09-19 ENCOUNTER — Emergency Department (HOSPITAL_COMMUNITY)
Admission: EM | Admit: 2013-09-19 | Discharge: 2013-09-19 | Disposition: A | Discharge status: Home / Self Care | Payer: Medicare Other | Attending: Emergency Medicine | Admitting: Emergency Medicine

## 2013-09-19 DIAGNOSIS — Z87891 Personal history of nicotine dependence: Secondary | ICD-10-CM | POA: Insufficient documentation

## 2013-09-19 DIAGNOSIS — E785 Hyperlipidemia, unspecified: Secondary | ICD-10-CM | POA: Insufficient documentation

## 2013-09-19 DIAGNOSIS — S61419A Laceration without foreign body of unspecified hand, initial encounter: Secondary | ICD-10-CM

## 2013-09-19 DIAGNOSIS — I2510 Atherosclerotic heart disease of native coronary artery without angina pectoris: Secondary | ICD-10-CM | POA: Insufficient documentation

## 2013-09-19 DIAGNOSIS — Y939 Activity, unspecified: Secondary | ICD-10-CM | POA: Insufficient documentation

## 2013-09-19 DIAGNOSIS — Y929 Unspecified place or not applicable: Secondary | ICD-10-CM | POA: Insufficient documentation

## 2013-09-19 DIAGNOSIS — S61209A Unspecified open wound of unspecified finger without damage to nail, initial encounter: Secondary | ICD-10-CM | POA: Insufficient documentation

## 2013-09-19 DIAGNOSIS — E119 Type 2 diabetes mellitus without complications: Secondary | ICD-10-CM | POA: Insufficient documentation

## 2013-09-19 DIAGNOSIS — I10 Essential (primary) hypertension: Secondary | ICD-10-CM | POA: Insufficient documentation

## 2013-09-19 DIAGNOSIS — Z7902 Long term (current) use of antithrombotics/antiplatelets: Secondary | ICD-10-CM | POA: Insufficient documentation

## 2013-09-19 DIAGNOSIS — Z792 Long term (current) use of antibiotics: Secondary | ICD-10-CM | POA: Insufficient documentation

## 2013-09-19 DIAGNOSIS — Z79899 Other long term (current) drug therapy: Secondary | ICD-10-CM | POA: Insufficient documentation

## 2013-09-19 DIAGNOSIS — Z880 Allergy status to penicillin: Secondary | ICD-10-CM | POA: Insufficient documentation

## 2013-09-19 DIAGNOSIS — W230XXA Caught, crushed, jammed, or pinched between moving objects, initial encounter: Secondary | ICD-10-CM | POA: Insufficient documentation

## 2013-09-19 DIAGNOSIS — Z7982 Long term (current) use of aspirin: Secondary | ICD-10-CM | POA: Insufficient documentation

## 2013-09-19 MED ORDER — SULFAMETHOXAZOLE-TRIMETHOPRIM 800-160 MG PO TABS: 1.0000 | ORAL_TABLET | Freq: Two times a day (BID) | ORAL | Status: DC

## 2013-09-19 MED ORDER — POVIDONE-IODINE 10 % EX SOLN
CUTANEOUS | Status: AC
  Filled 2013-09-19: qty 118

## 2013-09-19 MED ORDER — LIDOCAINE HCL (PF) 1 % IJ SOLN
INTRAMUSCULAR | Status: AC
  Filled 2013-09-19: qty 5

## 2013-09-19 NOTE — ED Provider Notes (Signed)
CSN: [id number]     Arrival date & time 09/19/13  1421 History   First MD Initiated Contact with Patient 09/19/13 1505     Chief Complaint  Patient presents with  . Extremity Laceration     (Consider location/radiation/quality/duration/timing/severity/associated sxs/prior Treatment) Patient is a 73 y.o. male presenting with hand injury. The history is provided by the patient (the pt cut his hand today accidentyl).  Hand Injury Location:  Hand Injury: yes   Mechanism of injury comment:  Got hand caught on water hose Hand location:  R hand Pain details:    Quality:  Aching   Severity:  Moderate   Onset quality:  Sudden   Timing:  Constant Associated symptoms: no back pain and no fatigue     Past Medical History  Diagnosis Date  . Hypertension   . Hyperlipidemia   . Non-insulin dependent type 2 diabetes mellitus   . Coronary artery disease    Past Surgical History  Procedure Laterality Date  . Carotid doppler  09/07/2009    No evidence of bilateral diameter reduction, significant tortuosity, or any other vascular abnormality  . Cardiac catheterization  08/03/2002    No intervention  . Cardiac catheterization  08/05/2002    Crux of RCA 95% stenosis, stented with a 2.5x81mm CYPHER stent resulting in reduction of 95% stenosis to 0% residual; overlapping CYPHER stents -3x81mm and 3x62mm- deployed at 15atm resulting in reduction of 70% stenosis to 0% residual.  . Cardiac catheterization  10/04/2002    No intervention  . Cardiovascular stress test  09/07/2009    Perfusion defect seen in inferior region consistent with diaphragmatic attenuation. Remaining myocardium demonstrates normal myocardial perfusion with no evidence of ischemia or infarct  . Transthoracic echocardiogram  03/30/2006    EF 50-55%, LA moderate-severely dilated,    Family History  Problem Relation Age of Onset  . Stroke Mother    History  Substance Use Topics  . Smoking status: Former Smoker    Quit date:  08/09/1983  . Smokeless tobacco: Not on file     Comment: quit chewing tobacco aug 2014  . Alcohol Use: Not on file    Review of Systems  Constitutional: Negative for appetite change and fatigue.  HENT: Negative for congestion, ear discharge and sinus pressure.   Eyes: Negative for discharge.  Respiratory: Negative for cough.   Cardiovascular: Negative for chest pain.  Gastrointestinal: Negative for abdominal pain and diarrhea.  Genitourinary: Negative for frequency and hematuria.  Musculoskeletal: Negative for back pain.       Right hand lac  Skin: Negative for rash.  Neurological: Negative for seizures and headaches.  Psychiatric/Behavioral: Negative for hallucinations.      Allergies  Penicillins and Voltaren  Home Medications   Current Outpatient Rx  Name  Route  Sig  Dispense  Refill  . aspirin EC 81 MG tablet   Oral   Take 81 mg by mouth daily.         Marland Kitchen atorvastatin (LIPITOR) 80 MG tablet   Oral   Take 1 tablet (80 mg total) by mouth daily.   90 tablet   0   . Cinnamon 500 MG capsule   Oral   Take 500 mg by mouth daily.         . clopidogrel (PLAVIX) 75 MG tablet   Oral   Take 1 tablet (75 mg total) by mouth daily.   90 tablet   2   . enalapril (VASOTEC) 2.5 MG tablet  Oral   Take 2.5 mg by mouth daily.         . fenofibrate micronized (LOFIBRA) 134 MG capsule   Oral   Take 1 capsule (134 mg total) by mouth daily before breakfast.   90 capsule   2   . gemfibrozil (LOPID) 600 MG tablet   Oral   Take 600 mg by mouth 2 (two) times daily before a meal.         . metFORMIN (GLUCOPHAGE) 1000 MG tablet   Oral   Take 1,000 mg by mouth 2 (two) times daily with a meal.         . metoprolol (LOPRESSOR) 50 MG tablet   Oral   Take 1 tablet (50 mg total) by mouth 2 (two) times daily.   180 tablet   2   . Omega-3 Fatty Acids (FISH OIL PO)   Oral   Take 2 capsules by mouth daily.         Marland Kitchen sulfamethoxazole-trimethoprim (SEPTRA DS)  800-160 MG per tablet   Oral   Take 1 tablet by mouth 2 (two) times daily.   10 tablet   0    BP 129/73  Pulse 62  Temp(Src) 98.2 F (36.8 C) (Oral)  Resp 16  Ht 6\' 5"  (1.956 m)  Wt 268 lb (121.564 kg)  BMI 31.77 kg/m2  SpO2 93% Physical Exam  Constitutional: He is oriented to person, place, and time. He appears well-developed.  HENT:  Head: Normocephalic.  Eyes: Conjunctivae are normal.  Neck: No tracheal deviation present.  Cardiovascular:  No murmur heard. Musculoskeletal: Normal range of motion.  4 cm lac to post right thumb.  Neuro vasc nl  Neurological: He is oriented to person, place, and time.  Skin: Skin is warm.  Psychiatric: He has a normal mood and affect.    ED Course  LACERATION REPAIR Date/Time: 09/19/2013 4:12 PM Performed by: Leshae Mcclay L Authorized by: Milton Ferguson L Comments: Thumb laceration 4 cm numbed with lido no epi.  7  4-0 sutures used to close laceration   (including critical care time) Labs Review Labs Reviewed - No data to display Imaging Review Dg Hand Complete Right  09/19/2013   CLINICAL DATA:  Laceration  EXAM: RIGHT HAND - COMPLETE 3+ VIEW  COMPARISON:  None.  FINDINGS: Three views of the right hand submitted. No acute fracture or subluxation. No radiopaque foreign body. Soft tissue laceration noted adjacent to distal aspect of first metacarpal.  IMPRESSION: No acute fracture or subluxation. Soft tissue laceration adjacent to distal aspect first metacarpal   Electronically Signed   By: Lahoma Crocker M.D.   On: 09/19/2013 15:34    EKG Interpretation   None       MDM   Final diagnoses:  Hand laceration        Maudry Diego, MD 09/19/13 [phone]

## 2013-09-19 NOTE — ED Notes (Signed)
Nonadherent dressing applied along with several 4x4s for padding and kurlex.

## 2013-09-19 NOTE — ED Notes (Signed)
Laceration to right thumb area. Pt states laceration was caused by a water hose. Pt is on Plavix. Bleeding controlled at time of arrival.

## 2013-09-19 NOTE — Discharge Instructions (Signed)
Sutures out in 2 weeks.  Clean laceration 2 times with soap and water.  Follow up with your md if problems and to get sutures out

## 2013-11-22 ENCOUNTER — Other Ambulatory Visit: Payer: Self-pay | Admitting: *Deleted

## 2013-11-22 MED ORDER — FENOFIBRATE MICRONIZED 134 MG PO CAPS: 134.0000 mg | ORAL_CAPSULE | Freq: Every day | ORAL | Status: DC

## 2013-11-22 MED ORDER — CLOPIDOGREL BISULFATE 75 MG PO TABS: 75.0000 mg | ORAL_TABLET | Freq: Every day | ORAL | Status: DC

## 2013-11-22 NOTE — Telephone Encounter (Signed)
Rx refill sent to patient pharmacy   

## 2013-11-22 NOTE — Telephone Encounter (Signed)
Rx was sent to pharmacy electronically. 

## 2013-11-29 LAB — HEPATIC FUNCTION PANEL
ALK PHOS: 33 U/L — AB (ref 39–117)
ALT: 20 U/L (ref 0–53)
AST: 26 U/L (ref 0–37)
Albumin: 3.9 g/dL (ref 3.5–5.2)
BILIRUBIN INDIRECT: 0.4 mg/dL (ref 0.2–1.2)
Bilirubin, Direct: 0.1 mg/dL (ref 0.0–0.3)
Total Bilirubin: 0.5 mg/dL (ref 0.2–1.2)
Total Protein: 6.2 g/dL (ref 6.0–8.3)

## 2013-11-29 LAB — LIPID PANEL
Cholesterol: 180 mg/dL (ref 0–200)
HDL: 27 mg/dL — ABNORMAL LOW (ref >39)
LDL CALC: 129 mg/dL — AB (ref 0–99)
TRIGLYCERIDES: 118 mg/dL (ref <150)
Total CHOL/HDL Ratio: 6.7 Ratio
VLDL: 24 mg/dL (ref 0–40)

## 2013-12-05 ENCOUNTER — Telehealth: Payer: Self-pay | Admitting: *Deleted

## 2013-12-05 DIAGNOSIS — E785 Hyperlipidemia, unspecified: Secondary | ICD-10-CM

## 2013-12-05 NOTE — Telephone Encounter (Signed)
Message copied by Chauncy Lean on Mon Dec 05, 2013  4:32 PM ------      Message from: Lorretta Harp      Created: Sun Dec 04, 2013  3:40 PM       FLP significantly worse compared to 3 months ago. Change Simva 80 to Atorva 80 and re check ------

## 2013-12-05 NOTE — Telephone Encounter (Signed)
Referral placed for lipid clinic Patient notified and agreeable.  Patient was taking atorvastatin 80mg  when labs were drawn. He has recently become intolerant to atrovastatin.

## 2013-12-08 ENCOUNTER — Ambulatory Visit (INDEPENDENT_AMBULATORY_CARE_PROVIDER_SITE_OTHER): Payer: Medicare Other | Admitting: Pharmacist

## 2013-12-08 VITALS — Wt 264.0 lb

## 2013-12-08 DIAGNOSIS — Z79899 Other long term (current) drug therapy: Secondary | ICD-10-CM

## 2013-12-08 DIAGNOSIS — E785 Hyperlipidemia, unspecified: Secondary | ICD-10-CM

## 2013-12-08 MED ORDER — SIMVASTATIN 40 MG PO TABS: 40.0000 mg | ORAL_TABLET | Freq: Every day | ORAL | Status: DC

## 2013-12-08 NOTE — Assessment & Plan Note (Addendum)
Patient is on 2 different fibrate medications currently, and given its interactions with statins, will have him discontinue Gemfibrozil.  Would like to put him on a potent statin, but he had side effects with lipitor, and will have issues with cost of Crestor.  Will thus, have him restart simvastatin at dose of 40 mg qhs.  We discussed the possibility of enrolling him into PCSK-9 inhibitor study in 3 months if LDL was still well above 70 mg/dL.  We discussed it was a SQ injection q 2 weeks and placebo controlled.  His CAD is > 5 years ago, but h/o diabetes and low HDL should get him into study if he is open to this.  I gave patient written and verbal instructions to d/c Gemfibrozil and he shows understanding of this.  Will hopefully be able to lose some weight cutting down on wine and fatty foods.  Will reassess lipid/liver in 3 months, and see me the next day. Plan: 1.  Stop Gemfibrozil 600 mg. 2.  Continue fenofibrate 134 mg once daily.  Take with a meal. 3.  Start Simvastatin 40 mg once daily.  Take it in the evening time with bed.   4.  Stop drinking wine. 5.  Limit fried foods and eggs in the diet. 6.  Recheck cholesterol in 3 months.  Would like to see your LDL < 70 mg/dL.  If elevated, we can consider enrolling you into clinical trial with PCSK-9 inhibitor.   7.  Blood work date is 03/06/14 AM (show up anytime after 7:30 AM and be fasting).  See Craig Taylor the next day on 03/07/14 at 10:30 am.

## 2013-12-08 NOTE — Progress Notes (Signed)
Patient a pleasant 73 y.o. WM referred to lipid clinic by Dr. Gwenlyn Found given recent failure of lipitor 80 mg due to side effects (rash and pruritis), and unable to get LDL to goal with simvastatin 80 mg qd.  He stopped his atorvastatin 1 week prior to his 11/29/13 cholesterol labs, and the rash resolved within 3 days.  This hasn't come back again. Patient changed from simvastatin to atorvastatin in 08/2013 for potency reasons, as LDL was 95 mg/dL on simvastatin 80 mg in the past.  Patient tells me cost is an issue and brand name agents typically put him into the donut hole.  He is also taking Fenofibrate 134 mg qd, Gemfibrozil 600 mg bid, and fish oil.  I had patient's wife check his bottles to confirm he was taking all of these.  He has a h/o CAD and diabetes, and would like to see LDL < 70 mg/dL.  Most recent LDL was 129 mg/dL when off statin.  He admits to drinking 1 glass of wine nightly, though he doesn't enjoy it.  He wants to know if he should continue this.  The reservatrol data hasn't been as strong as we once thought.  Risk Factors:  CAD, Diabetes, HTN, age, low HDL  - LDL goal < 70, non-HDL goal < 100 Meds:  Fenofibrate 134 mg with food, Gemfibrozil 600 mg bid, fish oil 2 g/d Intolerant:  Lipitor (pruritis and rash) Took simvastatin in past without problems, but stopped 08/2013 for potency reason.  He tells me brand name agents (i.e. Crestor) are difficult to afford and he struggles once he is in the donut hole.  Diet:  Patient typially eats a relatively low fat diet, however he does admit to eating fried foods and eggs more frequently than he should.  He drinks a lot of water.  Doesn't eat a lot of desserts.  Doesn't drink tea, and may have 1 diet soda a few days of the week.  He drinks a glass of wine per night, which he plans on stopping. Exercise:  Patient lives a very active lifestyle doing chores and gardening.    Labs: 11/2013:  LDL 129, TC 180, TG 118, HDL 27 (Gemfibrozil and fenofibrate) - he  stopped lipitor 80 mg one week prior to these labs 07/2013:  TC 151, LDL 95 (Simvastatin 80 mg qd, fenofibrate, Gemfibrozil)  Current Outpatient Prescriptions  Medication Sig Dispense Refill  . aspirin EC 81 MG tablet Take 81 mg by mouth daily.      . Cinnamon 500 MG capsule Take 500 mg by mouth daily.      . clopidogrel (PLAVIX) 75 MG tablet Take 1 tablet (75 mg total) by mouth daily.  90 tablet  3  . enalapril (VASOTEC) 2.5 MG tablet Take 2.5 mg by mouth daily.      . fenofibrate micronized (LOFIBRA) 134 MG capsule Take 1 capsule (134 mg total) by mouth daily before breakfast.  90 capsule  2  . gemfibrozil (LOPID) 600 MG tablet Take 600 mg by mouth 2 (two) times daily before a meal.      . metFORMIN (GLUCOPHAGE) 1000 MG tablet Take 1,000 mg by mouth 2 (two) times daily with a meal.      . metoprolol (LOPRESSOR) 50 MG tablet Take 1 tablet (50 mg total) by mouth 2 (two) times daily.  180 tablet  2  . Omega-3 Fatty Acids (FISH OIL PO) Take 2 capsules by mouth daily.      Marland Kitchen sulfamethoxazole-trimethoprim (SEPTRA DS)  800-160 MG per tablet Take 1 tablet by mouth 2 (two) times daily.  10 tablet  0   No current facility-administered medications for this visit.   Allergies  Allergen Reactions  . Lipitor [Atorvastatin]     pruritis  . Penicillins   . Voltaren [Diclofenac Sodium]    Family History  Problem Relation Age of Onset  . Stroke Mother

## 2013-12-08 NOTE — Patient Instructions (Signed)
1.  Stop Gemfibrozil 600 mg. 2.  Continue fenofibrate 134 mg once daily.  Take with a meal. 3.  Start Simvastatin 40 mg once daily.  Take it in the evening time with bed.   4.  Stop drinking wine. 5.  Limit fried foods and eggs in the diet. 6.  Recheck cholesterol in 3 months.  Would like to see your LDL < 70 mg/dL.  If elevated, we can consider enrolling you into clinical trial with PCSK-9 inhibitor.   7.  Blood work date is 03/06/14 AM (show up anytime after 7:30 AM and be fasting).  See Ysidro Evert Ambree Frances the next day on 03/07/14 at 10:30 am.

## 2014-03-06 ENCOUNTER — Other Ambulatory Visit: Payer: Medicare Other

## 2014-03-07 ENCOUNTER — Ambulatory Visit (INDEPENDENT_AMBULATORY_CARE_PROVIDER_SITE_OTHER): Payer: Medicare Other | Admitting: Pharmacist

## 2014-03-07 DIAGNOSIS — E785 Hyperlipidemia, unspecified: Secondary | ICD-10-CM

## 2014-03-07 DIAGNOSIS — Z79899 Other long term (current) drug therapy: Secondary | ICD-10-CM

## 2014-03-07 LAB — HEPATIC FUNCTION PANEL
ALBUMIN: 3.5 g/dL (ref 3.5–5.2)
ALK PHOS: 36 U/L — AB (ref 39–117)
ALT: 16 U/L (ref 0–53)
AST: 25 U/L (ref 0–37)
BILIRUBIN DIRECT: 0 mg/dL (ref 0.0–0.3)
TOTAL PROTEIN: 6.3 g/dL (ref 6.0–8.3)
Total Bilirubin: 0.6 mg/dL (ref 0.2–1.2)

## 2014-03-07 LAB — LIPID PANEL
Cholesterol: 132 mg/dL (ref 0–200)
HDL: 25.2 mg/dL — AB (ref >39.00)
LDL Cholesterol: 81 mg/dL (ref 0–99)
NonHDL: 106.8
Total CHOL/HDL Ratio: 5
Triglycerides: 129 mg/dL (ref 0.0–149.0)
VLDL: 25.8 mg/dL (ref 0.0–40.0)

## 2014-03-07 NOTE — Progress Notes (Signed)
Patient a pleasant 73 y.o. WM referred to lipid clinic by Dr. Gwenlyn Found given recent failure of lipitor 80 mg due to side effects (rash and pruritis).  He started simvastatin 40 mg 3 months ago, and has been tolerating this well since then.  His LDL has dropped from 130 mg/dL down to 81 mg/dL on this dose.   Patient tells me cost is an issue and brand name agents typically put him into the donut hole, so Crestor and/or Zetia not an option at this time.  He is also taking Fenofibrate 134 mg qd,  and fish oil as well.  He has a h/o CAD and diabetes, and would like to see LDL < 70 mg/dL if possible, but at least keep LDL < 100 mg/dL.     Risk Factors:  CAD, Diabetes, HTN, age, low HDL  - LDL goal < 70, non-HDL goal < 100 Meds:  Fenofibrate 134 mg with food, Simvastatin 40 mg qd, fish oil 2 g/d Intolerant:  Lipitor (pruritis and rash) He tells me brand name agents (i.e. Crestor) are difficult to afford and he struggles once he is in the donut hole.  Diet:  Patient typially eats a relatively low fat diet, however he does admit to eating fried foods and eggs more frequently than he should.  He drinks a lot of water.  Doesn't eat a lot of desserts.  Doesn't drink tea, and may have 1 diet soda a few days of the week.   Exercise:  Patient lives a very active lifestyle doing chores and gardening.    Labs: 02/2014:  LDL 81, TC 132, TG 129, HDL 25 (simvastatin 40 mg qd, fenofibrate 134 mg qd, fish oil) 11/2013:  LDL 129, TC 180, TG 118, HDL 27 (Gemfibrozil and fenofibrate) - he stopped lipitor 80 mg one week prior to these labs 07/2013:  TC 151, LDL 95 (Simvastatin 80 mg qd, fenofibrate, Gemfibrozil)  Current Outpatient Prescriptions  Medication Sig Dispense Refill  . aspirin EC 81 MG tablet Take 81 mg by mouth daily.      . Cinnamon 500 MG capsule Take 500 mg by mouth daily.      . clopidogrel (PLAVIX) 75 MG tablet Take 1 tablet (75 mg total) by mouth daily.  90 tablet  3  . enalapril (VASOTEC) 2.5 MG tablet Take  2.5 mg by mouth daily.      . fenofibrate micronized (LOFIBRA) 134 MG capsule Take 1 capsule (134 mg total) by mouth daily before breakfast.  90 capsule  2  . metFORMIN (GLUCOPHAGE) 1000 MG tablet Take 1,000 mg by mouth 2 (two) times daily with a meal.      . metoprolol (LOPRESSOR) 50 MG tablet Take 1 tablet (50 mg total) by mouth 2 (two) times daily.  180 tablet  2  . Omega-3 Fatty Acids (FISH OIL PO) Take 2 capsules by mouth daily.      . simvastatin (ZOCOR) 40 MG tablet Take 1 tablet (40 mg total) by mouth at bedtime.  30 tablet  6  . sulfamethoxazole-trimethoprim (SEPTRA DS) 800-160 MG per tablet Take 1 tablet by mouth 2 (two) times daily.  10 tablet  0   No current facility-administered medications for this visit.   Allergies  Allergen Reactions  . Lipitor [Atorvastatin]     pruritis  . Penicillins   . Voltaren [Diclofenac Sodium]    Family History  Problem Relation Age of Onset  . Stroke Mother

## 2014-03-07 NOTE — Patient Instructions (Signed)
Continue simvastatin 40 mg qd, along with fenofibrate and fish oil.  No other changes.  Recheck lipid / liver in 4-6 months when you see Dr. Gwenlyn Found.

## 2014-03-07 NOTE — Assessment & Plan Note (Signed)
Excellent improvement in lipid panel with LDL down to 81 mg/dL now.  Given Crestor, Zetia, Vytorin, and Welchol not an option at this time due to brand name / cost, will continue simvastatin 40 mg qd.  He couldn't tolerate atorvastatin.  Will have patient recheck lipid / liver when he sees Dr. Gwenlyn Found in 4-6 months.  Won't need to f/u with me again unless he starts having trouble tolerating simvastatin.  Patient agreeable to plan.

## 2014-03-08 ENCOUNTER — Other Ambulatory Visit: Payer: Self-pay | Admitting: *Deleted

## 2014-03-08 MED ORDER — CLOPIDOGREL BISULFATE 75 MG PO TABS: 75.0000 mg | ORAL_TABLET | Freq: Every day | ORAL | Status: DC

## 2014-03-10 ENCOUNTER — Encounter: Payer: Self-pay | Admitting: *Deleted

## 2014-04-20 ENCOUNTER — Telehealth: Payer: Self-pay | Admitting: Internal Medicine

## 2014-04-20 NOTE — Telephone Encounter (Signed)
TRIAGE TCS 37YR

## 2014-05-01 ENCOUNTER — Telehealth: Payer: Self-pay

## 2014-05-01 NOTE — Telephone Encounter (Signed)
PATIENT CALLED TO SCHEDULE COLONOSCOPY  °

## 2014-05-04 ENCOUNTER — Other Ambulatory Visit: Payer: Self-pay

## 2014-05-04 DIAGNOSIS — Z1211 Encounter for screening for malignant neoplasm of colon: Secondary | ICD-10-CM

## 2014-05-04 MED ORDER — PEG-KCL-NACL-NASULF-NA ASC-C 100 G PO SOLR: 1.0000 | ORAL | Status: DC

## 2014-05-04 NOTE — Telephone Encounter (Signed)
Day of prep: metformin 500mg  BID.

## 2014-05-04 NOTE — Telephone Encounter (Signed)
See separate triage.  

## 2014-05-04 NOTE — Telephone Encounter (Signed)
Rx sent to the pharmacy and instructions mailed to pt.  

## 2014-05-04 NOTE — Telephone Encounter (Signed)
See triage

## 2014-05-04 NOTE — Telephone Encounter (Addendum)
Gastroenterology Pre-Procedure Review  Request Date: 05/01/2014 Requesting Physician: On 10 year recall Last done in 05/2004  PATIENT REVIEW QUESTIONS: The patient responded to the following health history questions as indicated:    1. Diabetes Melitis: YES 2. Joint replacements in the past 12 months: no 3. Major health problems in the past 3 months: no 4. Has an artificial valve or MVP: no 5. Has a defibrillator: no 6. Has been advised in past to take antibiotics in advance of a procedure like teeth cleaning: no    MEDICATIONS & ALLERGIES:    Patient reports the following regarding taking any blood thinners:   Plavix? no Aspirin? no Coumadin? no  Patient confirms/reports the following medications:  Current Outpatient Prescriptions  Medication Sig Dispense Refill  . aspirin EC 81 MG tablet Take 81 mg by mouth daily.      . Cinnamon 500 MG capsule Take 500 mg by mouth daily.      . clopidogrel (PLAVIX) 75 MG tablet Take 1 tablet (75 mg total) by mouth daily.  90 tablet  0  . enalapril (VASOTEC) 2.5 MG tablet Take 2.5 mg by mouth daily.      . fenofibrate micronized (LOFIBRA) 134 MG capsule Take 1 capsule (134 mg total) by mouth daily before breakfast.  90 capsule  2  . metFORMIN (GLUCOPHAGE) 1000 MG tablet Take 1,000 mg by mouth 2 (two) times daily with a meal.      . metoprolol (LOPRESSOR) 50 MG tablet Take 1 tablet (50 mg total) by mouth 2 (two) times daily.  180 tablet  2  . Omega-3 Fatty Acids (FISH OIL PO) Take 2 capsules by mouth daily.      . simvastatin (ZOCOR) 40 MG tablet Take 40 mg by mouth at bedtime. Takes 1/2 tablet daily       No current facility-administered medications for this visit.    Patient confirms/reports the following allergies:  Allergies  Allergen Reactions  . Lipitor [Atorvastatin]     pruritis  . Penicillins   . Voltaren [Diclofenac Sodium]     No orders of the defined types were placed in this encounter.    AUTHORIZATION  INFORMATION Primary Insurance:   ID #:   Group #:  Pre-Cert / Auth required: Pre-Cert / Auth #:   Secondary Insurance:   ID #:  Group #:  Pre-Cert / Auth required:  Pre-Cert / Auth #:   SCHEDULE INFORMATION: Procedure has been scheduled as follows:  Date: 05/31/2014             Time:  7:30 Am Location: [facility] Short Stay  This Gastroenterology Pre-Precedure Review Form is being routed to the following provider(s): R. Garfield Cornea, MD

## 2014-05-22 ENCOUNTER — Encounter (HOSPITAL_COMMUNITY): Payer: Self-pay | Admitting: Pharmacy Technician

## 2014-05-29 ENCOUNTER — Encounter (HOSPITAL_COMMUNITY): Payer: Self-pay | Admitting: Pharmacy Technician

## 2014-05-30 NOTE — Progress Notes (Signed)
Patient's blood glucose was 90 at this time, after taking 500mg  Metformin this morning. Patient scheduled to take 500mg  Metformin this afternoon. Doctor Rourk ordered Metformin to be held this afternoon and in the morning. Patient verbalized understanding.

## 2014-05-31 ENCOUNTER — Encounter (HOSPITAL_COMMUNITY): Payer: Self-pay | Admitting: *Deleted

## 2014-05-31 ENCOUNTER — Ambulatory Visit (HOSPITAL_COMMUNITY)
Admission: RE | Admit: 2014-05-31 | Discharge: 2014-05-31 | Disposition: A | Discharge status: Home / Self Care | Payer: Medicare Other | Source: Ambulatory Visit | Attending: Internal Medicine | Admitting: Internal Medicine

## 2014-05-31 ENCOUNTER — Encounter (HOSPITAL_COMMUNITY)
Admission: RE | Disposition: A | Discharge status: Home / Self Care | Payer: Self-pay | Source: Ambulatory Visit | Attending: Internal Medicine

## 2014-05-31 DIAGNOSIS — Z87891 Personal history of nicotine dependence: Secondary | ICD-10-CM | POA: Diagnosis not present

## 2014-05-31 DIAGNOSIS — E785 Hyperlipidemia, unspecified: Secondary | ICD-10-CM | POA: Diagnosis not present

## 2014-05-31 DIAGNOSIS — E118 Type 2 diabetes mellitus with unspecified complications: Secondary | ICD-10-CM | POA: Diagnosis not present

## 2014-05-31 DIAGNOSIS — Z1211 Encounter for screening for malignant neoplasm of colon: Secondary | ICD-10-CM | POA: Insufficient documentation

## 2014-05-31 DIAGNOSIS — Z7982 Long term (current) use of aspirin: Secondary | ICD-10-CM | POA: Diagnosis not present

## 2014-05-31 DIAGNOSIS — I10 Essential (primary) hypertension: Secondary | ICD-10-CM | POA: Diagnosis not present

## 2014-05-31 DIAGNOSIS — I2510 Atherosclerotic heart disease of native coronary artery without angina pectoris: Secondary | ICD-10-CM | POA: Insufficient documentation

## 2014-05-31 DIAGNOSIS — Z7902 Long term (current) use of antithrombotics/antiplatelets: Secondary | ICD-10-CM | POA: Diagnosis not present

## 2014-05-31 DIAGNOSIS — Z79899 Other long term (current) drug therapy: Secondary | ICD-10-CM | POA: Insufficient documentation

## 2014-05-31 HISTORY — PX: COLONOSCOPY: SHX5424

## 2014-05-31 LAB — GLUCOSE, CAPILLARY: GLUCOSE-CAPILLARY: 121 mg/dL — AB (ref 70–99)

## 2014-05-31 SURGERY — COLONOSCOPY
Anesthesia: Moderate Sedation

## 2014-05-31 MED ORDER — ONDANSETRON HCL 4 MG/2ML IJ SOLN
INTRAMUSCULAR | Status: DC
Start: 2014-05-31 — End: 2014-05-31
  Filled 2014-05-31: qty 2

## 2014-05-31 MED ORDER — MEPERIDINE HCL 100 MG/ML IJ SOLN
INTRAMUSCULAR | Status: AC
  Filled 2014-05-31: qty 2

## 2014-05-31 MED ORDER — MIDAZOLAM HCL 5 MG/5ML IJ SOLN
INTRAMUSCULAR | Status: AC
  Filled 2014-05-31: qty 10

## 2014-05-31 MED ORDER — MIDAZOLAM HCL 5 MG/5ML IJ SOLN
INTRAMUSCULAR | Status: DC | PRN
  Administered 2014-05-31: 1 mg via INTRAVENOUS
  Administered 2014-05-31: 2 mg via INTRAVENOUS

## 2014-05-31 MED ORDER — MEPERIDINE HCL 100 MG/ML IJ SOLN
INTRAMUSCULAR | Status: DC | PRN
  Administered 2014-05-31: 25 mg via INTRAVENOUS
  Administered 2014-05-31: 50 mg via INTRAVENOUS

## 2014-05-31 MED ORDER — SODIUM CHLORIDE 0.9 % IV SOLN
INTRAVENOUS | Status: DC
  Administered 2014-05-31: 07:00:00 via INTRAVENOUS

## 2014-05-31 MED ORDER — ONDANSETRON HCL 4 MG/2ML IJ SOLN
INTRAMUSCULAR | Status: DC | PRN
  Administered 2014-05-31: 4 mg via INTRAVENOUS

## 2014-05-31 NOTE — H&P (Signed)
$'@LOGO'C$ @   Primary Care Physician:  Glo Herring., MD Primary Gastroenterologist:  Dr. Gala Romney  Pre-Procedure History & Physical: HPI:  Craig Taylor is a 73 y.o. male is here for a screening colonoscopy. Negative colonoscopy 10 years ago. Patient on Plavix because of CAD and stents. No bowel symptoms. No family history of colon cancer.  Past Medical History  Diagnosis Date  . Hypertension   . Hyperlipidemia   . Non-insulin dependent type 2 diabetes mellitus   . Coronary artery disease     Past Surgical History  Procedure Laterality Date  . Carotid doppler  09/07/2009    No evidence of bilateral diameter reduction, significant tortuosity, or any other vascular abnormality  . Cardiac catheterization  08/03/2002    No intervention  . Cardiac catheterization  08/05/2002    Crux of RCA 95% stenosis, stented with a 2.5x21mm CYPHER stent resulting in reduction of 95% stenosis to 0% residual; overlapping CYPHER stents -3x38mm and 3x19mm- deployed at 15atm resulting in reduction of 70% stenosis to 0% residual.  . Cardiac catheterization  10/04/2002    No intervention  . Cardiovascular stress test  09/07/2009    Perfusion defect seen in inferior region consistent with diaphragmatic attenuation. Remaining myocardium demonstrates normal myocardial perfusion with no evidence of ischemia or infarct  . Transthoracic echocardiogram  03/30/2006    EF 50-55%, LA moderate-severely dilated,   . Colonoscopy      Prior to Admission medications   Medication Sig Start Date End Date Taking? Authorizing Provider  aspirin EC 81 MG tablet Take 81 mg by mouth daily.   Yes Historical Provider, MD  Cinnamon 500 MG capsule Take 500 mg by mouth daily.   Yes Historical Provider, MD  clopidogrel (PLAVIX) 75 MG tablet Take 1 tablet (75 mg total) by mouth daily. 03/08/14  Yes Lorretta Harp, MD  Cod Liver Oil CAPS Take 1 capsule by mouth daily.   Yes Historical Provider, MD  enalapril (VASOTEC) 2.5 MG tablet  Take 2.5 mg by mouth daily.   Yes Historical Provider, MD  fenofibrate micronized (LOFIBRA) 134 MG capsule Take 1 capsule (134 mg total) by mouth daily before breakfast. 11/22/13  Yes Lorretta Harp, MD  ibuprofen (ADVIL,MOTRIN) 200 MG tablet Take 400 mg by mouth every 6 (six) hours as needed for headache.   Yes Historical Provider, MD  metFORMIN (GLUCOPHAGE) 500 MG tablet Take 500 mg by mouth 2 (two) times daily with a meal.   Yes Historical Provider, MD  metoprolol (LOPRESSOR) 50 MG tablet Take 1 tablet (50 mg total) by mouth 2 (two) times daily. 08/31/13  Yes Lorretta Harp, MD  peg 3350 powder (MOVIPREP) 100 G SOLR Take 1 kit (200 g total) by mouth as directed. 05/04/14  Yes Daneil Dolin, MD  simvastatin (ZOCOR) 40 MG tablet Take 40 mg by mouth at bedtime. Takes 1/2 tablet daily 12/08/13  Yes Lorretta Harp, MD    Allergies as of 05/04/2014 - Review Complete 05/01/2014  Allergen Reaction Noted  . Lipitor [atorvastatin]  12/08/2013  . Penicillins  08/03/2013  . Voltaren [diclofenac sodium]  08/03/2013    Family History  Problem Relation Age of Onset  . Stroke Mother   . Colon cancer Neg Hx     History   Social History  . Marital Status: Married    Spouse Name: N/A    Number of Children: N/A  . Years of Education: N/A   Occupational History  . Not on file.   Social  History Main Topics  . Smoking status: Former Smoker -- 2.00 packs/day for 21 years    Types: Cigarettes    Quit date: 08/09/1983  . Smokeless tobacco: Not on file     Comment: quit chewing tobacco aug 2014  . Alcohol Use: No  . Drug Use: No  . Sexual Activity: Not on file   Other Topics Concern  . Not on file   Social History Narrative  . No narrative on file    Review of Systems: See HPI, otherwise negative ROS  Physical Exam: BP 127/78  Pulse 68  Temp(Src) 97.6 F (36.4 C) (Oral)  Resp 16  Ht $R'6\' 3"'AG$  (1.905 m)  Wt 265 lb (120.203 kg)  BMI 33.12 kg/m2  SpO2 94% General:   Alert,   Well-developed, well-nourished, pleasant and cooperative in NAD Head:  Normocephalic and atraumatic. Eyes:  Sclera clear, no icterus.   Conjunctiva pink. Ears:  Normal auditory acuity. Nose:  No deformity, discharge,  or lesions. Mouth:  No deformity or lesions, dentition normal. Neck:  Supple; no masses or thyromegaly. Lungs:  Clear throughout to auscultation.   No wheezes, crackles, or rhonchi. No acute distress. Heart:  Regular rate and rhythm; no murmurs, clicks, rubs,  or gallops. Abdomen:  Soft, nontender and nondistended. No masses, hepatosplenomegaly or hernias noted. Normal bowel sounds, without guarding, and without rebound.   Msk:  Symmetrical without gross deformities. Normal posture. Pulses:  Normal pulses noted. Extremities:  Without clubbing or edema. Neurologic:  Alert and  oriented x4;  grossly normal neurologically. Skin:  Intact without significant lesions or rashes. Cervical Nodes:  No significant cervical adenopathy. Psych:  Alert and cooperative. Normal mood and affect.  Impression/Plan: Craig Taylor is now here to undergo a screening colonoscopy.  Average risk screening examination. Discussed potential issues with Plavix and polypectomies. Risks, benefits, limitations, imponderables and alternatives regarding colonoscopy have been reviewed with the patient. Questions have been answered. All parties agreeable.     Notice:  This dictation was prepared with Dragon dictation along with smaller phrase technology. Any transcriptional errors that result from this process are unintentional and may not be corrected upon review.

## 2014-05-31 NOTE — Op Note (Signed)
[facility] [address] [place], [address]   COLONOSCOPY PROCEDURE REPORT  PATIENT: Craig Taylor, Craig Taylor  MR#: [id number] BIRTHDATE: 1940/11/20 , 72  yrs. old GENDER: male ENDOSCOPIST: R.  Garfield Cornea, MD FACP [facility] REFERRED XT:GGYIR Gerarda Fraction, M.D. PROCEDURE DATE:  06-15-2014 PROCEDURE:   Colonoscopy, screening INDICATIONS:.Marland KitchenMarland KitchenAverage risk colorectal cancer screening examination ; negative screening colonoscopy 10 years ago MEDICATIONS: Versed 3 mg IV Demerol 75 mg IV in divided doses. Zofran 4 mg IV. ASA CLASS:       Class III  CONSENT: The risks, benefits, alternatives and imponderables including but not limited to bleeding, perforation as well as the possibility of a missed lesion have been reviewed.  The potential for biopsy, lesion removal, etc. have also been discussed. Questions have been answered.  All parties agreeable.  Please see the history and physical in the medical record for more information.  DESCRIPTION OF PROCEDURE:   After the risks benefits and alternatives of the procedure were thoroughly explained, informed consent was obtained.  The digital rectal exam revealed no rectal mass and revealed no abnormalities of the rectum.   The EC-[id number] ([id number])  endoscope was introduced through the anus and advanced to the cecum, which was identified by both the appendix and ileocecal valve. No adverse events experienced.   The quality of the prep was excellent.  adequate.  The instrument was then slowly withdrawn as the colon was fully examined.      COLON FINDINGS: Normal-appearing rectal mucosa.  Normal appearing colonic mucosa.  Retroflexed views revealed no abnormalities. .  Withdrawal time=6 minutes 0 seconds.  The scope was withdrawn and the procedure completed. COMPLICATIONS: There were no immediate complications.  ENDOSCOPIC IMPRESSION: Normal colonoscopy  RECOMMENDATIONS: No future colonoscopy unless symptoms develop  eSigned:  R.  Garfield Cornea, MD Rosalita Chessman [facility] 06-15-2014 8:08 AM   cc:  CPT CODES: ICD CODES:  The ICD and CPT codes recommended by this software are interpretations from the data that the clinical staff has captured with the software.  The verification of the translation of this report to the ICD and CPT codes and modifiers is the sole responsibility of the health care institution and practicing physician where this report was generated.  [employer] will not be held responsible for the validity of the ICD and CPT codes included on this report.  AMA assumes no liability for data contained or not contained herein. CPT is a Designer, television/film set of the [employer].  PATIENT NAME:  Mumin, Denomme MR#: [id number]

## 2014-05-31 NOTE — Discharge Instructions (Signed)
°  Colonoscopy Discharge Instructions  Read the instructions outlined below and refer to this sheet in the next few weeks. These discharge instructions provide you with general information on caring for yourself after you leave the hospital. Your doctor may also give you specific instructions. While your treatment has been planned according to the most current medical practices available, unavoidable complications occasionally occur. If you have any problems or questions after discharge, call Dr. Gala Romney at [phone]. ACTIVITY  You may resume your regular activity, but move at a slower pace for the next 24 hours.   Take frequent rest periods for the next 24 hours.   Walking will help get rid of the air and reduce the bloated feeling in your belly (abdomen).   No driving for 24 hours (because of the medicine (anesthesia) used during the test).    Do not sign any important legal documents or operate any machinery for 24 hours (because of the anesthesia used during the test).  NUTRITION  Drink plenty of fluids.   You may resume your normal diet as instructed by your doctor.   Begin with a light meal and progress to your normal diet. Heavy or fried foods are harder to digest and may make you feel sick to your stomach (nauseated).   Avoid alcoholic beverages for 24 hours or as instructed.  MEDICATIONS  You may resume your normal medications unless your doctor tells you otherwise.  WHAT YOU CAN EXPECT TODAY  Some feelings of bloating in the abdomen.   Passage of more gas than usual.   Spotting of blood in your stool or on the toilet paper.  IF YOU HAD POLYPS REMOVED DURING THE COLONOSCOPY:  No aspirin products for 7 days or as instructed.   No alcohol for 7 days or as instructed.   Eat a soft diet for the next 24 hours.  FINDING OUT THE RESULTS OF YOUR TEST Not all test results are available during your visit. If your test results are not back during the visit, make an appointment  with your caregiver to find out the results. Do not assume everything is normal if you have not heard from your caregiver or the medical facility. It is important for you to follow up on all of your test results.  SEEK IMMEDIATE MEDICAL ATTENTION IF:  You have more than a spotting of blood in your stool.   Your belly is swollen (abdominal distention).   You are nauseated or vomiting.   You have a temperature over 101.   You have abdominal pain or discomfort that is severe or gets worse throughout the day.    No future colonoscopy recommended unless symptoms develop

## 2014-06-01 ENCOUNTER — Other Ambulatory Visit: Payer: Self-pay | Admitting: *Deleted

## 2014-06-01 MED ORDER — METOPROLOL TARTRATE 50 MG PO TABS: 50.0000 mg | ORAL_TABLET | Freq: Two times a day (BID) | ORAL | Status: DC

## 2014-06-01 NOTE — Telephone Encounter (Signed)
Rx was sent to pharmacy electronically. Patient sees Dr. Bronson Ing now

## 2014-06-02 ENCOUNTER — Encounter (HOSPITAL_COMMUNITY): Payer: Self-pay | Admitting: Internal Medicine

## 2014-06-06 ENCOUNTER — Other Ambulatory Visit: Payer: Self-pay | Admitting: *Deleted

## 2014-06-06 MED ORDER — CLOPIDOGREL BISULFATE 75 MG PO TABS: 75.0000 mg | ORAL_TABLET | Freq: Every day | ORAL | Status: DC

## 2014-06-06 NOTE — Telephone Encounter (Signed)
Refilled electronically 

## 2014-07-17 ENCOUNTER — Ambulatory Visit (INDEPENDENT_AMBULATORY_CARE_PROVIDER_SITE_OTHER): Payer: Medicare Other | Admitting: Cardiovascular Disease

## 2014-07-17 ENCOUNTER — Encounter: Payer: Self-pay | Admitting: Cardiovascular Disease

## 2014-07-17 VITALS — BP 120/90 | HR 58 | Ht 75.0 in | Wt 260.0 lb

## 2014-07-17 DIAGNOSIS — Z136 Encounter for screening for cardiovascular disorders: Secondary | ICD-10-CM

## 2014-07-17 DIAGNOSIS — I714 Abdominal aortic aneurysm, without rupture, unspecified: Secondary | ICD-10-CM

## 2014-07-17 DIAGNOSIS — I2510 Atherosclerotic heart disease of native coronary artery without angina pectoris: Secondary | ICD-10-CM

## 2014-07-17 DIAGNOSIS — I10 Essential (primary) hypertension: Secondary | ICD-10-CM

## 2014-07-17 DIAGNOSIS — E785 Hyperlipidemia, unspecified: Secondary | ICD-10-CM

## 2014-07-17 NOTE — Progress Notes (Signed)
Patient ID: Craig Taylor, male   DOB: 22-Jun-1941, 73 y.o.   MRN: [id number]      SUBJECTIVE: The patient is a 73 year old male who I am meeting for the first time. He was previously followed by Dr. Gwenlyn Found. He has a history of coronary artery disease with RCA stenting in 2003. He also has a history of hypertension, hyperlipidemia, and diabetes mellitus. 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 He is feeling well and denies chest pain, shortness of breath, palpitations, dizziness, lightheadedness, leg swelling, and orthopnea. He said he had his lipids checked 2 weeks ago by his primary care physician and his total cholesterol was reportedly 153. His HbA1c was reportedly 5.1% and a diabetes medication he had been taking since the year 2000 was discontinued. He said he and his wife have been drinking an herbal tea twice daily which helped reduce his blood sugar. He also said he has a history of an abdominal aortic aneurysm which has not been evaluated in several years. His wife, Hassan Rowan, has COPD and has questions regarding her heart as well.  Review of Systems: As per "subjective", otherwise negative.  Allergies  Allergen Reactions  . Lipitor [Atorvastatin]     pruritis  . Penicillins   . Voltaren [Diclofenac Sodium]     Current Outpatient Prescriptions  Medication Sig Dispense Refill  . aspirin EC 81 MG tablet Take 81 mg by mouth daily.    . Cinnamon 500 MG capsule Take 500 mg by mouth daily.    . clopidogrel (PLAVIX) 75 MG tablet Take 1 tablet (75 mg total) by mouth daily. 90 tablet 0  . Cod Liver Oil CAPS Take 1 capsule by mouth daily.    . enalapril (VASOTEC) 2.5 MG tablet Take 2.5 mg by mouth daily.    . fenofibrate micronized (LOFIBRA) 134 MG capsule Take 1 capsule (134 mg total) by mouth daily before breakfast. 90 capsule 2  . ibuprofen (ADVIL,MOTRIN) 200 MG tablet Take 400 mg  by mouth every 6 (six) hours as needed for headache.    . metFORMIN (GLUCOPHAGE) 500 MG tablet Take 500 mg by mouth 2 (two) times daily with a meal.    . metoprolol (LOPRESSOR) 50 MG tablet Take 1 tablet (50 mg total) by mouth 2 (two) times daily. 180 tablet 0  . peg 3350 powder (MOVIPREP) 100 G SOLR Take 1 kit (200 g total) by mouth as directed. 1 kit 0  . simvastatin (ZOCOR) 40 MG tablet Take 40 mg by mouth at bedtime. Takes 1/2 tablet daily     No current facility-administered medications for this visit.    Past Medical History  Diagnosis Date  . Hypertension   . Hyperlipidemia   . Non-insulin dependent type 2 diabetes mellitus   . Coronary artery disease     Past Surgical History  Procedure Laterality Date  . Carotid doppler  09/07/2009    No evidence of bilateral diameter reduction, significant tortuosity, or any other vascular abnormality  . Cardiac catheterization  08/03/2002    No intervention  . Cardiac catheterization  08/05/2002    Crux of RCA 95% stenosis, stented with a 2.5x42m CYPHER stent resulting in reduction of 95% stenosis to 0% residual; overlapping CYPHER stents -3x254mand 3x1842mdeployed at 15atm resulting in reduction of 70% stenosis to 0% residual.  . Cardiac catheterization  10/04/2002    No intervention  . Cardiovascular stress test  09/07/2009    Perfusion defect seen in inferior region consistent with diaphragmatic attenuation. Remaining myocardium demonstrates normal myocardial perfusion with no evidence of ischemia or infarct  . Transthoracic echocardiogram  03/30/2006    EF 50-55%, LA moderate-severely dilated,   . Colonoscopy    . Colonoscopy N/A 05/31/2014    Procedure: COLONOSCOPY;  Surgeon: Daneil Dolin, MD;  Location: [facility];  Service: Endoscopy;  Laterality: N/A;  7:30 Am    History   Social History  . Marital Status: Married    Spouse Name: N/A    Number of Children: N/A  . Years of Education: N/A   Occupational History  . Not  on file.   Social History Main Topics  . Smoking status: Former Smoker -- 2.00 packs/day for 21 years    Types: Cigarettes    Quit date: 08/09/1983  . Smokeless tobacco: Not on file     Comment: quit chewing tobacco aug 2014  . Alcohol Use: No  . Drug Use: No  . Sexual Activity: Not on file   Other Topics Concern  . Not on file   Social History Narrative  . No narrative on file     Filed Vitals:   07/17/14 0827  Height: _0  (1.905 m)  Weight: 260 lb (117.935 kg)   BP 120/90  Pulse 58 SpO2 96%   PHYSICAL EXAM General: NAD HEENT: Normal. Neck: No JVD, no thyromegaly. Lungs: Clear to auscultation bilaterally with normal respiratory effort. CV: Nondisplaced PMI.  Regular rate and rhythm, normal S1/S2, no S3/S4, no murmur. No pretibial or periankle edema.  No carotid bruit.  Normal pedal pulses.  Abdomen: Soft, nontender, no hepatosplenomegaly, no distention.  Neurologic: Alert and oriented x 3.  Psych: Normal affect. Skin: Normal. Musculoskeletal: Normal range of motion, no gross deformities. Extremities: No clubbing or cyanosis.   ECG: Most recent ECG reviewed.      ASSESSMENT AND PLAN: 1. CAD: Stable ischemic heart disease. Continue ASA, metoprolol and simvastatin. Will d/c Plavix. 2. Essential HTN: Reasonably controlled on current therapy. No changes. 3. Hyperlipidemia: HDL 25, LDL 81 in 02/2014. Will obtain copy of recently checked lipids and LFT's. Continue simvastatin 40 mg. 4. Type 2 diabetes: No longer on meds. 5. AAA: Will obtain ultrasound.  Dispo: f/u 6 months in [place] office.  Kate Sable, M.D., F.A.C.C.

## 2014-07-17 NOTE — Patient Instructions (Signed)
   Stop Plavix Continue all other medications.   Your physician has requested that you have an abdominal aorta duplex. During this test, an ultrasound is used to evaluate the aorta. Allow 30 minutes for this exam. Do not eat after midnight the day before and avoid carbonated beverages Office will contact with results via phone or letter.   Your physician wants you to follow up in: 6 months.  You will receive a reminder letter in the mail one-two months in advance.  If you don't receive a letter, please call our office to schedule the follow up appointment

## 2014-08-09 ENCOUNTER — Encounter (INDEPENDENT_AMBULATORY_CARE_PROVIDER_SITE_OTHER): Payer: Medicare Other

## 2014-08-09 DIAGNOSIS — I714 Abdominal aortic aneurysm, without rupture, unspecified: Secondary | ICD-10-CM

## 2014-08-10 ENCOUNTER — Encounter: Payer: Self-pay | Admitting: *Deleted

## 2014-09-04 ENCOUNTER — Other Ambulatory Visit: Payer: Self-pay

## 2014-09-04 MED ORDER — METOPROLOL TARTRATE 50 MG PO TABS: 50.0000 mg | ORAL_TABLET | Freq: Two times a day (BID) | ORAL | Status: DC

## 2014-09-04 NOTE — Telephone Encounter (Signed)
Rx sent to pharmacy   

## 2014-09-12 ENCOUNTER — Other Ambulatory Visit (HOSPITAL_COMMUNITY): Payer: Self-pay | Admitting: Family Medicine

## 2014-09-12 ENCOUNTER — Ambulatory Visit (HOSPITAL_COMMUNITY)
Admission: RE | Admit: 2014-09-12 | Discharge: 2014-09-12 | Disposition: A | Discharge status: Home / Self Care | Payer: PPO | Source: Ambulatory Visit | Attending: Family Medicine | Admitting: Family Medicine

## 2014-09-12 DIAGNOSIS — I10 Essential (primary) hypertension: Secondary | ICD-10-CM | POA: Diagnosis not present

## 2014-09-12 DIAGNOSIS — R059 Cough, unspecified: Secondary | ICD-10-CM

## 2014-09-12 DIAGNOSIS — I2510 Atherosclerotic heart disease of native coronary artery without angina pectoris: Secondary | ICD-10-CM | POA: Insufficient documentation

## 2014-09-12 DIAGNOSIS — R0989 Other specified symptoms and signs involving the circulatory and respiratory systems: Secondary | ICD-10-CM | POA: Insufficient documentation

## 2014-09-12 DIAGNOSIS — Z955 Presence of coronary angioplasty implant and graft: Secondary | ICD-10-CM | POA: Insufficient documentation

## 2014-09-12 DIAGNOSIS — E119 Type 2 diabetes mellitus without complications: Secondary | ICD-10-CM | POA: Diagnosis not present

## 2014-09-12 DIAGNOSIS — Z87891 Personal history of nicotine dependence: Secondary | ICD-10-CM | POA: Insufficient documentation

## 2014-09-12 DIAGNOSIS — R05 Cough: Secondary | ICD-10-CM | POA: Diagnosis present

## 2014-12-05 ENCOUNTER — Other Ambulatory Visit: Payer: Self-pay | Admitting: Cardiovascular Disease

## 2014-12-05 MED ORDER — METOPROLOL TARTRATE 50 MG PO TABS: 50.0000 mg | ORAL_TABLET | Freq: Two times a day (BID) | ORAL | Status: DC

## 2014-12-05 NOTE — Telephone Encounter (Signed)
Received fax refill request  Rx # [id number] Medication:  Metoprolol Tart 50 mg tab Qty 180  Sig:  Take one tablet by mouth twice daily Physician:  Bronson Ing

## 2014-12-29 ENCOUNTER — Other Ambulatory Visit: Payer: Self-pay

## 2014-12-29 MED ORDER — SIMVASTATIN 40 MG PO TABS: 40.0000 mg | ORAL_TABLET | Freq: Every day | ORAL | Status: DC

## 2015-01-01 ENCOUNTER — Other Ambulatory Visit: Payer: Self-pay | Admitting: *Deleted

## 2015-01-01 MED ORDER — SIMVASTATIN 20 MG PO TABS: 20.0000 mg | ORAL_TABLET | Freq: Every day | ORAL | Status: DC

## 2015-01-01 NOTE — Telephone Encounter (Signed)
Simvastatin 20mg  - 90 day supply sent to pharm at patient request.  Call placed to patient to confirm dosage.  Was previously taking 1/2 tab of the 40mg  tablet daily.  Will send 20mg  tablet to pharmacy so patient will no longer have to break in half.

## 2015-01-15 ENCOUNTER — Ambulatory Visit (INDEPENDENT_AMBULATORY_CARE_PROVIDER_SITE_OTHER): Payer: PPO | Admitting: Cardiovascular Disease

## 2015-01-15 ENCOUNTER — Encounter: Payer: Self-pay | Admitting: Cardiovascular Disease

## 2015-01-15 VITALS — BP 138/80 | HR 59 | Ht 75.0 in | Wt 258.0 lb

## 2015-01-15 DIAGNOSIS — I10 Essential (primary) hypertension: Secondary | ICD-10-CM | POA: Diagnosis not present

## 2015-01-15 DIAGNOSIS — I714 Abdominal aortic aneurysm, without rupture, unspecified: Secondary | ICD-10-CM

## 2015-01-15 DIAGNOSIS — I2510 Atherosclerotic heart disease of native coronary artery without angina pectoris: Secondary | ICD-10-CM

## 2015-01-15 DIAGNOSIS — E785 Hyperlipidemia, unspecified: Secondary | ICD-10-CM | POA: Diagnosis not present

## 2015-01-15 MED ORDER — NITROGLYCERIN 0.4 MG SL SUBL: 0.4000 mg | SUBLINGUAL_TABLET | SUBLINGUAL | Status: DC | PRN

## 2015-01-15 NOTE — Patient Instructions (Addendum)
   Refill sent to pharmacy for your Nitroglycerin Continue all other medications.   Have your family doctor send [place] copy of Lipids when done.  Your physician wants you to follow up in: 6 months.  You will receive a reminder letter in the mail one-two months in advance.  If you don't receive a letter, please call our office to schedule the follow up appointment

## 2015-01-15 NOTE — Progress Notes (Signed)
Patient ID: Craig Taylor, male   DOB: 06-03-1941, 74 y.o.   MRN: [id number]      SUBJECTIVE: The patient presents for routine cardiovascular follow-up. He underwent RCA stenting in 2003 and also has hypertension, hyperlipidemia, and diabetes mellitus. He denies chest pain and shortness of breath. He has 2 daughters, one in [place] and another in [place], who help to take care of him.   Soc: His wife of 75 years, Craig Taylor, passed away one week ago of cancer. He has 2 daughters, one in [place] and another in [place], who help to take care of him.    Review of Systems: As per "subjective", otherwise negative.  Allergies  Allergen Reactions  . Lipitor [Atorvastatin]     pruritis  . Penicillins   . Voltaren [Diclofenac Sodium]     Current Outpatient Prescriptions  Medication Sig Dispense Refill  . aspirin EC 81 MG tablet Take 81 mg by mouth daily.    . Cinnamon 500 MG capsule Take 500 mg by mouth daily.    Marland Kitchen Cod Liver Oil CAPS Take 1 capsule by mouth daily.    . enalapril (VASOTEC) 2.5 MG tablet Take 2.5 mg by mouth daily.    . fenofibrate micronized (LOFIBRA) 134 MG capsule Take 1 capsule (134 mg total) by mouth daily before breakfast. 90 capsule 2  . ibuprofen (ADVIL,MOTRIN) 200 MG tablet Take 400 mg by mouth every 6 (six) hours as needed for headache.    . metoprolol (LOPRESSOR) 50 MG tablet Take 1 tablet (50 mg total) by mouth 2 (two) times daily. 180 tablet 0  . simvastatin (ZOCOR) 20 MG tablet Take 1 tablet (20 mg total) by mouth daily. 90 tablet 3   No current facility-administered medications for this visit.    Past Medical History  Diagnosis Date  . Hypertension   . Hyperlipidemia   . Non-insulin dependent type 2 diabetes mellitus   . Coronary artery disease     Past Surgical History  Procedure Laterality Date  . Carotid doppler  09/07/2009    No evidence of bilateral diameter reduction, significant tortuosity, or any other vascular abnormality  . Cardiac  catheterization  08/03/2002    No intervention  . Cardiac catheterization  08/05/2002    Crux of RCA 95% stenosis, stented with a 2.5x71mm CYPHER stent resulting in reduction of 95% stenosis to 0% residual; overlapping CYPHER stents -3x45mm and 3x91mm- deployed at 15atm resulting in reduction of 70% stenosis to 0% residual.  . Cardiac catheterization  10/04/2002    No intervention  . Cardiovascular stress test  09/07/2009    Perfusion defect seen in inferior region consistent with diaphragmatic attenuation. Remaining myocardium demonstrates normal myocardial perfusion with no evidence of ischemia or infarct  . Transthoracic echocardiogram  03/30/2006    EF 50-55%, LA moderate-severely dilated,   . Colonoscopy    . Colonoscopy N/A 05/31/2014    Procedure: COLONOSCOPY;  Surgeon: Daneil Dolin, MD;  Location: [facility];  Service: Endoscopy;  Laterality: N/A;  7:30 Am    History   Social History  . Marital Status: Married    Spouse Name: N/A  . Number of Children: N/A  . Years of Education: N/A   Occupational History  . Not on file.   Social History Main Topics  . Smoking status: Former Smoker -- 2.00 packs/day for 21 years    Types: Cigarettes    Start date: 07/04/1957    Quit date: 08/09/1983  . Smokeless tobacco: Former Systems developer  Comment: quit chewing tobacco aug 2014  . Alcohol Use: No  . Drug Use: No  . Sexual Activity: Not on file   Other Topics Concern  . Not on file   Social History Narrative     Filed Vitals:   01/15/15 0834  BP: 138/80  Pulse: 59  Height: 6\' 3"  (1.905 m)  Weight: 258 lb (117.028 kg)  SpO2: 95%    PHYSICAL EXAM General: NAD HEENT: Normal. Neck: No JVD, no thyromegaly. Lungs: Clear to auscultation bilaterally with normal respiratory effort. CV: Nondisplaced PMI.  Regular rate and rhythm, normal S1/S2, no S3/S4, no murmur. No pretibial or periankle edema.  No carotid bruit.  Normal pedal pulses.  Abdomen: Soft, nontender, no  hepatosplenomegaly, no distention.  Neurologic: Alert and oriented x 3.  Psych: Normal affect. Skin: Normal. Musculoskeletal: Normal range of motion, no gross deformities. Extremities: No clubbing or cyanosis.   ECG: Most recent ECG reviewed.      ASSESSMENT AND PLAN: 1. CAD: Stable ischemic heart disease. Continue ASA, metoprolol and simvastatin. While he has not had to ever use SL nitroglycerin, I will refill as he has an old bottle.  2. Essential HTN: Reasonably controlled on current therapy. No changes.  3. Hyperlipidemia: HDL 25, LDL 81 in 02/2014. Will repeat lipids. Continue simvastatin 40 mg.  4. Type 2 diabetes: No longer on meds.  5. AAA: Normal ultrasound in 07/2014.  Dispo: f/u 6 months.   Kate Sable, M.D., F.A.C.C.

## 2015-03-07 ENCOUNTER — Other Ambulatory Visit: Payer: Self-pay

## 2015-03-07 MED ORDER — METOPROLOL TARTRATE 50 MG PO TABS: 50.0000 mg | ORAL_TABLET | Freq: Two times a day (BID) | ORAL | Status: DC

## 2015-06-15 ENCOUNTER — Encounter (HOSPITAL_COMMUNITY): Payer: Self-pay | Admitting: Emergency Medicine

## 2015-06-15 ENCOUNTER — Inpatient Hospital Stay (HOSPITAL_COMMUNITY)
Admission: EM | Admit: 2015-06-15 | Discharge: 2015-06-16 | DRG: 690 | Disposition: A | Discharge status: Home / Self Care | Payer: PPO | Attending: Internal Medicine | Admitting: Internal Medicine

## 2015-06-15 DIAGNOSIS — Z880 Allergy status to penicillin: Secondary | ICD-10-CM

## 2015-06-15 DIAGNOSIS — Z87891 Personal history of nicotine dependence: Secondary | ICD-10-CM

## 2015-06-15 DIAGNOSIS — Z888 Allergy status to other drugs, medicaments and biological substances status: Secondary | ICD-10-CM

## 2015-06-15 DIAGNOSIS — E785 Hyperlipidemia, unspecified: Secondary | ICD-10-CM | POA: Diagnosis present

## 2015-06-15 DIAGNOSIS — I2510 Atherosclerotic heart disease of native coronary artery without angina pectoris: Secondary | ICD-10-CM | POA: Diagnosis present

## 2015-06-15 DIAGNOSIS — E119 Type 2 diabetes mellitus without complications: Secondary | ICD-10-CM | POA: Diagnosis not present

## 2015-06-15 DIAGNOSIS — N289 Disorder of kidney and ureter, unspecified: Secondary | ICD-10-CM | POA: Diagnosis present

## 2015-06-15 DIAGNOSIS — I10 Essential (primary) hypertension: Secondary | ICD-10-CM | POA: Diagnosis present

## 2015-06-15 DIAGNOSIS — E871 Hypo-osmolality and hyponatremia: Secondary | ICD-10-CM | POA: Diagnosis not present

## 2015-06-15 DIAGNOSIS — D649 Anemia, unspecified: Secondary | ICD-10-CM

## 2015-06-15 DIAGNOSIS — T368X5A Adverse effect of other systemic antibiotics, initial encounter: Secondary | ICD-10-CM | POA: Diagnosis present

## 2015-06-15 DIAGNOSIS — E860 Dehydration: Secondary | ICD-10-CM | POA: Diagnosis present

## 2015-06-15 DIAGNOSIS — R319 Hematuria, unspecified: Secondary | ICD-10-CM

## 2015-06-15 DIAGNOSIS — Y92009 Unspecified place in unspecified non-institutional (private) residence as the place of occurrence of the external cause: Secondary | ICD-10-CM

## 2015-06-15 DIAGNOSIS — Z7982 Long term (current) use of aspirin: Secondary | ICD-10-CM

## 2015-06-15 DIAGNOSIS — N390 Urinary tract infection, site not specified: Secondary | ICD-10-CM | POA: Diagnosis not present

## 2015-06-15 DIAGNOSIS — E878 Other disorders of electrolyte and fluid balance, not elsewhere classified: Secondary | ICD-10-CM

## 2015-06-15 DIAGNOSIS — R112 Nausea with vomiting, unspecified: Secondary | ICD-10-CM | POA: Diagnosis present

## 2015-06-15 DIAGNOSIS — R17 Unspecified jaundice: Secondary | ICD-10-CM

## 2015-06-15 LAB — URINALYSIS, ROUTINE W REFLEX MICROSCOPIC
Glucose, UA: 250 mg/dL — AB
KETONES UR: NEGATIVE mg/dL
Leukocytes, UA: NEGATIVE
NITRITE: POSITIVE — AB
PROTEIN: 100 mg/dL — AB
Specific Gravity, Urine: 1.02 (ref 1.005–1.030)
Urobilinogen, UA: 4 mg/dL — ABNORMAL HIGH (ref 0.0–1.0)
pH: 5 (ref 5.0–8.0)

## 2015-06-15 LAB — BASIC METABOLIC PANEL
ANION GAP: 12 (ref 5–15)
BUN: 17 mg/dL (ref 6–20)
CALCIUM: 9.1 mg/dL (ref 8.9–10.3)
CHLORIDE: 100 mmol/L — AB (ref 101–111)
CO2: 20 mmol/L — AB (ref 22–32)
Creatinine, Ser: 1.39 mg/dL — ABNORMAL HIGH (ref 0.61–1.24)
GFR calc Af Amer: 57 mL/min — ABNORMAL LOW (ref >60)
GFR calc non Af Amer: 49 mL/min — ABNORMAL LOW (ref >60)
GLUCOSE: 178 mg/dL — AB (ref 65–99)
Potassium: 3.8 mmol/L (ref 3.5–5.1)
Sodium: 132 mmol/L — ABNORMAL LOW (ref 135–145)

## 2015-06-15 LAB — CBC WITH DIFFERENTIAL/PLATELET
BASOS PCT: 0 %
Basophils Absolute: 0 10*3/uL (ref 0.0–0.1)
EOS ABS: 0 10*3/uL (ref 0.0–0.7)
Eosinophils Relative: 0 %
HCT: 36.9 % — ABNORMAL LOW (ref 39.0–52.0)
Hemoglobin: 12.5 g/dL — ABNORMAL LOW (ref 13.0–17.0)
Lymphocytes Relative: 4 %
Lymphs Abs: 0.8 10*3/uL (ref 0.7–4.0)
MCH: 28.7 pg (ref 26.0–34.0)
MCHC: 33.9 g/dL (ref 30.0–36.0)
MCV: 84.6 fL (ref 78.0–100.0)
MONO ABS: 1.3 10*3/uL — AB (ref 0.1–1.0)
MONOS PCT: 7 %
Neutro Abs: 16.8 10*3/uL — ABNORMAL HIGH (ref 1.7–7.7)
Neutrophils Relative %: 89 %
PLATELETS: 250 10*3/uL (ref 150–400)
RBC: 4.36 MIL/uL (ref 4.22–5.81)
RDW: 13.1 % (ref 11.5–15.5)
WBC: 18.9 10*3/uL — ABNORMAL HIGH (ref 4.0–10.5)

## 2015-06-15 LAB — HEPATIC FUNCTION PANEL
ALT: 20 U/L (ref 17–63)
AST: 24 U/L (ref 15–41)
Albumin: 3.6 g/dL (ref 3.5–5.0)
Alkaline Phosphatase: 46 U/L (ref 38–126)
BILIRUBIN DIRECT: 0.3 mg/dL (ref 0.1–0.5)
BILIRUBIN INDIRECT: 1.2 mg/dL — AB (ref 0.3–0.9)
Total Bilirubin: 1.5 mg/dL — ABNORMAL HIGH (ref 0.3–1.2)
Total Protein: 7.2 g/dL (ref 6.5–8.1)

## 2015-06-15 LAB — CBG MONITORING, ED: Glucose-Capillary: 150 mg/dL — ABNORMAL HIGH (ref 65–99)

## 2015-06-15 LAB — URINE MICROSCOPIC-ADD ON

## 2015-06-15 LAB — GLUCOSE, CAPILLARY: Glucose-Capillary: 129 mg/dL — ABNORMAL HIGH (ref 65–99)

## 2015-06-15 MED ORDER — SODIUM CHLORIDE 0.9 % IV SOLN
1000.0000 mL | Freq: Once | INTRAVENOUS | Status: AC
  Administered 2015-06-15: 1000 mL via INTRAVENOUS

## 2015-06-15 MED ORDER — INSULIN ASPART 100 UNIT/ML ~~LOC~~ SOLN
0.0000 [IU] | Freq: Three times a day (TID) | SUBCUTANEOUS | Status: DC
  Administered 2015-06-16: 2 [IU] via SUBCUTANEOUS
  Administered 2015-06-16: 3 [IU] via SUBCUTANEOUS

## 2015-06-15 MED ORDER — NITROGLYCERIN 0.4 MG SL SUBL: 0.4000 mg | SUBLINGUAL_TABLET | SUBLINGUAL | Status: DC | PRN

## 2015-06-15 MED ORDER — ENOXAPARIN SODIUM 30 MG/0.3ML ~~LOC~~ SOLN: 30.0000 mg | SUBCUTANEOUS | Status: DC

## 2015-06-15 MED ORDER — SIMVASTATIN 20 MG PO TABS: 20.0000 mg | ORAL_TABLET | Freq: Every day | ORAL | Status: DC

## 2015-06-15 MED ORDER — METOCLOPRAMIDE HCL 5 MG/ML IJ SOLN: 10.0000 mg | Freq: Four times a day (QID) | INTRAMUSCULAR | Status: DC | PRN

## 2015-06-15 MED ORDER — DIPHENHYDRAMINE HCL 50 MG/ML IJ SOLN
25.0000 mg | Freq: Once | INTRAMUSCULAR | Status: AC
  Administered 2015-06-15: 25 mg via INTRAVENOUS
  Filled 2015-06-15: qty 1

## 2015-06-15 MED ORDER — DEXTROSE 5 % IV SOLN
1.0000 g | Freq: Once | INTRAVENOUS | Status: AC
  Administered 2015-06-15: 1 g via INTRAVENOUS
  Filled 2015-06-15: qty 10

## 2015-06-15 MED ORDER — SODIUM CHLORIDE 0.9 % IV SOLN: INTRAVENOUS | Status: DC

## 2015-06-15 MED ORDER — ASPIRIN EC 81 MG PO TBEC
81.0000 mg | DELAYED_RELEASE_TABLET | Freq: Every day | ORAL | Status: DC
  Administered 2015-06-15 – 2015-06-16 (×2): 81 mg via ORAL
  Filled 2015-06-15 (×2): qty 1

## 2015-06-15 MED ORDER — METOCLOPRAMIDE HCL 5 MG/ML IJ SOLN
10.0000 mg | Freq: Once | INTRAMUSCULAR | Status: AC
  Administered 2015-06-15: 10 mg via INTRAVENOUS
  Filled 2015-06-15: qty 2

## 2015-06-15 MED ORDER — FENOFIBRATE 160 MG PO TABS
160.0000 mg | ORAL_TABLET | Freq: Every day | ORAL | Status: DC
  Filled 2015-06-15: qty 1

## 2015-06-15 MED ORDER — SIMVASTATIN 20 MG PO TABS
20.0000 mg | ORAL_TABLET | Freq: Every day | ORAL | Status: DC
  Administered 2015-06-15: 20 mg via ORAL
  Filled 2015-06-15: qty 1

## 2015-06-15 MED ORDER — DEXTROSE 5 % IV SOLN
1.0000 g | INTRAVENOUS | Status: DC
  Filled 2015-06-15 (×2): qty 10

## 2015-06-15 MED ORDER — SODIUM CHLORIDE 0.9 % IV SOLN
1000.0000 mL | INTRAVENOUS | Status: DC
  Administered 2015-06-15: 1000 mL via INTRAVENOUS

## 2015-06-15 MED ORDER — ONDANSETRON HCL 4 MG/2ML IJ SOLN
4.0000 mg | Freq: Once | INTRAMUSCULAR | Status: AC
  Administered 2015-06-15: 4 mg via INTRAVENOUS
  Filled 2015-06-15: qty 2

## 2015-06-15 MED ORDER — INSULIN ASPART 100 UNIT/ML ~~LOC~~ SOLN: 0.0000 [IU] | Freq: Every day | SUBCUTANEOUS | Status: DC

## 2015-06-15 MED ORDER — METOPROLOL TARTRATE 50 MG PO TABS
50.0000 mg | ORAL_TABLET | Freq: Two times a day (BID) | ORAL | Status: DC
  Administered 2015-06-15 – 2015-06-16 (×2): 50 mg via ORAL
  Filled 2015-06-15 (×2): qty 1

## 2015-06-15 MED ORDER — ENALAPRIL MALEATE 5 MG PO TABS
2.5000 mg | ORAL_TABLET | Freq: Every day | ORAL | Status: DC
  Administered 2015-06-16: 2.5 mg via ORAL
  Filled 2015-06-15: qty 1

## 2015-06-15 MED ORDER — SODIUM CHLORIDE 0.9 % IV SOLN
INTRAVENOUS | Status: DC
  Administered 2015-06-15: 23:00:00 via INTRAVENOUS

## 2015-06-15 MED ORDER — COD LIVER OIL PO CAPS: 1.0000 | ORAL_CAPSULE | Freq: Every day | ORAL | Status: DC

## 2015-06-15 MED ORDER — ENOXAPARIN SODIUM 40 MG/0.4ML ~~LOC~~ SOLN
40.0000 mg | SUBCUTANEOUS | Status: DC
  Administered 2015-06-15: 40 mg via SUBCUTANEOUS
  Filled 2015-06-15: qty 0.4

## 2015-06-15 NOTE — H&P (Signed)
[facility] History and Physical  Craig Taylor:[id number] DOB: May 10, 1941 DOA: 06/15/2015  Referring physician: ER PCP: Glo Herring., MD   Chief Complaint: Nausea, vomiting  HPI: Craig Taylor is a 74 y.o. male  This is a 74 year old man, diet controlled diabetic, who presents with 24-hour history of nausea and vomiting. He had gone to the urgent care yesterday evening because of symptoms of dysuria and increased frequency of micturition. He was diagnosed with UTI and started on oral ciprofloxacin. Unfortunately, he not tolerate the ciprofloxacin and started vomiting almost intractably yesterday and today. He denies any fever. There is no hematemesis. There is no diarrhea. He is now being admitted because he is unable to stop the vomiting. During his emergency room visit, intravenous metoclopramide seems to have helped him the best.   Review of Systems:  Apart from symptoms above, all systems are negative.  Past Medical History  Diagnosis Date  . Hypertension   . Hyperlipidemia   . Non-insulin dependent type 2 diabetes mellitus ([place])   . Coronary artery disease    Past Surgical History  Procedure Laterality Date  . Carotid doppler  09/07/2009    No evidence of bilateral diameter reduction, significant tortuosity, or any other vascular abnormality  . Cardiac catheterization  08/03/2002    No intervention  . Cardiac catheterization  08/05/2002    Crux of RCA 95% stenosis, stented with a 2.5x45mm CYPHER stent resulting in reduction of 95% stenosis to 0% residual; overlapping CYPHER stents -3x55mm and 3x81mm- deployed at 15atm resulting in reduction of 70% stenosis to 0% residual.  . Cardiac catheterization  10/04/2002    No intervention  . Cardiovascular stress test  09/07/2009    Perfusion defect seen in inferior region consistent with diaphragmatic attenuation. Remaining myocardium demonstrates normal myocardial perfusion with no evidence of ischemia or infarct    . Transthoracic echocardiogram  03/30/2006    EF 50-55%, LA moderate-severely dilated,   . Colonoscopy    . Colonoscopy N/A 05/31/2014    Procedure: COLONOSCOPY;  Surgeon: Daneil Dolin, MD;  Location: [facility];  Service: Endoscopy;  Laterality: N/A;  7:30 Am   Social History:  reports that he quit smoking about 31 years ago. His smoking use included Cigarettes. He started smoking about 57 years ago. He has a 42 pack-year smoking history. He has quit using smokeless tobacco. He reports that he does not drink alcohol or use illicit drugs.  Allergies  Allergen Reactions  . Lipitor [Atorvastatin]     pruritis  . Penicillins   . Voltaren [Diclofenac Sodium]     Family History  Problem Relation Age of Onset  . Stroke Mother   . Colon cancer Neg Hx       Prior to Admission medications   Medication Sig Start Date End Date Taking? Authorizing Provider  aspirin EC 81 MG tablet Take 81 mg by mouth daily.   Yes Historical Provider, MD  Cinnamon 500 MG capsule Take 500 mg by mouth daily.   Yes Historical Provider, MD  [facility] Liver Oil CAPS Take 1 capsule by mouth daily.   Yes Historical Provider, MD  enalapril (VASOTEC) 2.5 MG tablet Take 2.5 mg by mouth daily.   Yes Historical Provider, MD  fenofibrate micronized (LOFIBRA) 134 MG capsule Take 1 capsule (134 mg total) by mouth daily before breakfast. 11/22/13  Yes Lorretta Harp, MD  ibuprofen (ADVIL,MOTRIN) 200 MG tablet Take 400 mg by mouth every 6 (six) hours as needed for headache.  Yes Historical Provider, MD  metoprolol (LOPRESSOR) 50 MG tablet Take 1 tablet (50 mg total) by mouth 2 (two) times daily. 03/07/15  Yes Herminio Commons, MD  nitroGLYCERIN (NITROSTAT) 0.4 MG SL tablet Place 1 tablet (0.4 mg total) under the tongue every 5 (five) minutes as needed for chest pain. 01/15/15  Yes Herminio Commons, MD  simvastatin (ZOCOR) 20 MG tablet Take 1 tablet (20 mg total) by mouth daily. 01/01/15  Yes Herminio Commons, MD    Physical Exam: Filed Vitals:   06/15/15 1830 06/15/15 1900 06/15/15 1930 06/15/15 2000  BP: 139/82 143/75 147/77 147/78  Pulse: 94 88 87 86  Temp:      TempSrc:      Resp: 16     Height:      Weight:      SpO2: 93% 94% 95% 93%    Wt Readings from Last 3 Encounters:  06/15/15 118.842 kg (262 lb)  01/15/15 117.028 kg (258 lb)  07/17/14 117.935 kg (260 lb)    General:  Appears calm and comfortable. He looks slightly dehydrated. He is not toxic or septic. Eyes: PERRL, normal lids, irises & conjunctiva ENT: grossly normal hearing, lips & tongue Neck: no LAD, masses or thyromegaly Cardiovascular: RRR, no m/r/g. No LE edema. Telemetry: SR, no arrhythmias  Respiratory: CTA bilaterally, no w/r/r. Normal respiratory effort. Abdomen: soft, ntnd Skin: no rash or induration seen on limited exam Musculoskeletal: grossly normal tone BUE/BLE Psychiatric: grossly normal mood and affect, speech fluent and appropriate Neurologic: grossly non-focal.          Labs on Admission:  Basic Metabolic Panel:  Recent Labs Lab 06/15/15 1825  NA 132*  K 3.8  CL 100*  CO2 20*  GLUCOSE 178*  BUN 17  CREATININE 1.39*  CALCIUM 9.1   Liver Function Tests:  Recent Labs Lab 06/15/15 1825  AST 24  ALT 20  ALKPHOS 46  BILITOT 1.5*  PROT 7.2  ALBUMIN 3.6   No results for input(s): LIPASE, AMYLASE in the last 168 hours. No results for input(s): AMMONIA in the last 168 hours. CBC:  Recent Labs Lab 06/15/15 1825  WBC 18.9*  NEUTROABS 16.8*  HGB 12.5*  HCT 36.9*  MCV 84.6  PLT 250   Cardiac Enzymes: No results for input(s): CKTOTAL, CKMB, CKMBINDEX, TROPONINI in the last 168 hours.  BNP (last 3 results) No results for input(s): BNP in the last 8760 hours.  ProBNP (last 3 results) No results for input(s): PROBNP in the last 8760 hours.  CBG:  Recent Labs Lab 06/15/15 1755  GLUCAP 150*    Radiological Exams on Admission: No results  found.    Assessment/Plan   1. UTI. Treat with intravenous Rocephin. 2. Nausea and vomiting. This is likely a combination of reaction to ciprofloxacin and the UTI itself. Metoclopramide when necessary intravenously.  3. Renal dysfunction. This may be chronic or acute. Clinically is dehydrated and I will treat him with IV fluids. 4. Diabetes. This appears to be diet controlled. Sliding scale insulin. 5. Hypertension. Stable. Continue with home medications. 6. Coronary artery disease. Stable.  He will be admitted to the medical floor. Further recommendations will depend on patient's hospital progress.  Code Status: Full code.  DVT Prophylaxis: Lovenox.  Family Communication: I discussed the plan with the patient at the bedside.   Disposition Plan: Home when medically stable.  Time spent: 45 minutes.  Doree Albee [facility] Pager [phone].

## 2015-06-15 NOTE — ED Provider Notes (Signed)
CSN: [id number]     Arrival date & time 06/15/15  1743 History   First MD Initiated Contact with Patient 06/15/15 1759     Chief Complaint  Patient presents with  . Emesis     (Consider location/radiation/quality/duration/timing/severity/associated sxs/prior Treatment) Patient is a 74 y.o. male presenting with vomiting. The history is provided by the patient.  Emesis He was seen in urgent care yesterday for urinary tract infection and started on ciprofloxacin and phenazopyridine. Last night, he started vomiting and has been vomiting about every 30 minutes. He has not been able to hold anything down including his antibiotic. He had one episode of diarrhea. He denies fever, chills, sweats. He is feeling a little lightheaded. He denies any sick contacts. He does admit to urinary urgency, frequency, tenesmus.   Past Medical History  Diagnosis Date  . Hypertension   . Hyperlipidemia   . Non-insulin dependent type 2 diabetes mellitus ([place])   . Coronary artery disease    Past Surgical History  Procedure Laterality Date  . Carotid doppler  09/07/2009    No evidence of bilateral diameter reduction, significant tortuosity, or any other vascular abnormality  . Cardiac catheterization  08/03/2002    No intervention  . Cardiac catheterization  08/05/2002    Crux of RCA 95% stenosis, stented with a 2.5x56mm CYPHER stent resulting in reduction of 95% stenosis to 0% residual; overlapping CYPHER stents -3x32mm and 3x45mm- deployed at 15atm resulting in reduction of 70% stenosis to 0% residual.  . Cardiac catheterization  10/04/2002    No intervention  . Cardiovascular stress test  09/07/2009    Perfusion defect seen in inferior region consistent with diaphragmatic attenuation. Remaining myocardium demonstrates normal myocardial perfusion with no evidence of ischemia or infarct  . Transthoracic echocardiogram  03/30/2006    EF 50-55%, LA moderate-severely dilated,   . Colonoscopy    . Colonoscopy N/A  05/31/2014    Procedure: COLONOSCOPY;  Surgeon: Daneil Dolin, MD;  Location: [facility];  Service: Endoscopy;  Laterality: N/A;  7:30 Am   Family History  Problem Relation Age of Onset  . Stroke Mother   . Colon cancer Neg Hx    Social History  Substance Use Topics  . Smoking status: Former Smoker -- 2.00 packs/day for 21 years    Types: Cigarettes    Start date: 07/04/1957    Quit date: 08/09/1983  . Smokeless tobacco: Former Systems developer     Comment: quit chewing tobacco aug 2014  . Alcohol Use: No    Review of Systems  Gastrointestinal: Positive for vomiting.  All other systems reviewed and are negative.     Allergies  Lipitor; Penicillins; and Voltaren  Home Medications   Prior to Admission medications   Medication Sig Start Date End Date Taking? Authorizing Provider  aspirin EC 81 MG tablet Take 81 mg by mouth daily.    Historical Provider, MD  Cinnamon 500 MG capsule Take 500 mg by mouth daily.    Historical Provider, MD  [facility] Liver Oil CAPS Take 1 capsule by mouth daily.    Historical Provider, MD  enalapril (VASOTEC) 2.5 MG tablet Take 2.5 mg by mouth daily.    Historical Provider, MD  fenofibrate micronized (LOFIBRA) 134 MG capsule Take 1 capsule (134 mg total) by mouth daily before breakfast. 11/22/13   Lorretta Harp, MD  ibuprofen (ADVIL,MOTRIN) 200 MG tablet Take 400 mg by mouth every 6 (six) hours as needed for headache.    Historical Provider, MD  metoprolol (LOPRESSOR) 50 MG tablet Take 1 tablet (50 mg total) by mouth 2 (two) times daily. 03/07/15   Herminio Commons, MD  nitroGLYCERIN (NITROSTAT) 0.4 MG SL tablet Place 1 tablet (0.4 mg total) under the tongue every 5 (five) minutes as needed for chest pain. 01/15/15   Herminio Commons, MD  simvastatin (ZOCOR) 20 MG tablet Take 1 tablet (20 mg total) by mouth daily. 01/01/15   Herminio Commons, MD   BP 126/76 mmHg  Pulse 103  Temp(Src) 97.9 F (36.6 C) (Oral)  Resp 18  Ht 6\' 6"  (1.981 m)  Wt 262 lb  (118.842 kg)  BMI 30.28 kg/m2  SpO2 96% Physical Exam  Nursing note and vitals reviewed.  74 year old male, resting comfortably and in no acute distress. Vital signs are significant for borderline tachycardia. Oxygen saturation is 96%, which is normal. Head is normocephalic and atraumatic. PERRLA, EOMI. Oropharynx is clear. Neck is nontender and supple without adenopathy or JVD. Back is nontender and there is no CVA tenderness. Lungs are clear without rales, wheezes, or rhonchi. Chest is nontender. Heart has regular rate and rhythm without murmur. Abdomen is soft, flat, nontender without masses or hepatosplenomegaly and peristalsis is hypoactive. Extremities have no cyanosis or edema, full range of motion is present. Skin is warm and dry without rash. Neurologic: Mental status is normal, cranial nerves are intact, there are no motor or sensory deficits.  ED Course  Procedures (including critical care time) Labs Review Results for orders placed or performed during the hospital encounter of 06/15/15  Hepatic function panel  Result Value Ref Range   Total Protein 7.2 6.5 - 8.1 g/dL   Albumin 3.6 3.5 - 5.0 g/dL   AST 24 15 - 41 U/L   ALT 20 17 - 63 U/L   Alkaline Phosphatase 46 38 - 126 U/L   Total Bilirubin 1.5 (H) 0.3 - 1.2 mg/dL   Bilirubin, Direct 0.3 0.1 - 0.5 mg/dL   Indirect Bilirubin 1.2 (H) 0.3 - 0.9 mg/dL  CBC with Differential  Result Value Ref Range   WBC 18.9 (H) 4.0 - 10.5 K/uL   RBC 4.36 4.22 - 5.81 MIL/uL   Hemoglobin 12.5 (L) 13.0 - 17.0 g/dL   HCT 36.9 (L) 39.0 - 52.0 %   MCV 84.6 78.0 - 100.0 fL   MCH 28.7 26.0 - 34.0 pg   MCHC 33.9 30.0 - 36.0 g/dL   RDW 13.1 11.5 - 15.5 %   Platelets 250 150 - 400 K/uL   Neutrophils Relative % 89 %   Neutro Abs 16.8 (H) 1.7 - 7.7 K/uL   Lymphocytes Relative 4 %   Lymphs Abs 0.8 0.7 - 4.0 K/uL   Monocytes Relative 7 %   Monocytes Absolute 1.3 (H) 0.1 - 1.0 K/uL   Eosinophils Relative 0 %   Eosinophils Absolute 0.0 0.0  - 0.7 K/uL   Basophils Relative 0 %   Basophils Absolute 0.0 0.0 - 0.1 K/uL  Basic metabolic panel  Result Value Ref Range   Sodium 132 (L) 135 - 145 mmol/L   Potassium 3.8 3.5 - 5.1 mmol/L   Chloride 100 (L) 101 - 111 mmol/L   CO2 20 (L) 22 - 32 mmol/L   Glucose, Bld 178 (H) 65 - 99 mg/dL   BUN 17 6 - 20 mg/dL   Creatinine, Ser 1.39 (H) 0.61 - 1.24 mg/dL   Calcium 9.1 8.9 - 10.3 mg/dL   GFR calc non Af Amer 49 (L) >  60 mL/min   GFR calc Af Amer 57 (L) >60 mL/min   Anion gap 12 5 - 15  CBG monitoring, ED  Result Value Ref Range   Glucose-Capillary 150 (H) 65 - 99 mg/dL   I have personally reviewed and evaluated these lab results as part of my medical decision-making.  MDM   Final diagnoses:  Nausea and vomiting, vomiting of unspecified type  Urinary tract infection with hematuria, site unspecified  Renal insufficiency  Total bilirubin, elevated  Hyponatremia  Low bicarbonate level  Normochromic normocytic anemia    Nausea and vomiting which may be viral gastritis, may be a reaction to ciprofloxacin. I reviewed his results on care everywhere and he was seen at a [facility] clinic yesterday with urine showing definite evidence of infection with positive nitrite and tender 50 WBCs and 10-50 RBCs. Urine was sent for culture, so culture does not need to be repeated. He'll be given IV fluids and ondansetron and will be given a dose of ceftriaxone.  Nausea persisted but was relieved with metoclopramide. Laboratory workup has come back showing significant leukocytosis with left shift. He is noted to have mild renal insufficiency with creatinine of 1.39. No prior renal function is on record. 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 Because of these findings, it was felt appropriate to admit him for ongoing hydration and monitoring of renal function. Case is discussed with Dr. Loleta Books of [facility] who agrees to admit the patient  under observation status.  Delora Fuel, MD 28/78/67 [id number]

## 2015-06-15 NOTE — ED Notes (Signed)
Pt c/o of 12-13 episodes of vomiting starting today. Pt unable to tolerate anything PO. Pt seen at urgent care for a UTI yesterday. Pt prescribed Cipro and Pyridium. Pt stated that vomiting began after taking first dose of medication.

## 2015-06-16 DIAGNOSIS — N390 Urinary tract infection, site not specified: Secondary | ICD-10-CM | POA: Diagnosis not present

## 2015-06-16 DIAGNOSIS — E119 Type 2 diabetes mellitus without complications: Secondary | ICD-10-CM

## 2015-06-16 DIAGNOSIS — Z87891 Personal history of nicotine dependence: Secondary | ICD-10-CM | POA: Diagnosis not present

## 2015-06-16 DIAGNOSIS — E871 Hypo-osmolality and hyponatremia: Secondary | ICD-10-CM | POA: Diagnosis present

## 2015-06-16 DIAGNOSIS — Z7982 Long term (current) use of aspirin: Secondary | ICD-10-CM | POA: Diagnosis not present

## 2015-06-16 DIAGNOSIS — E785 Hyperlipidemia, unspecified: Secondary | ICD-10-CM | POA: Diagnosis not present

## 2015-06-16 DIAGNOSIS — R112 Nausea with vomiting, unspecified: Secondary | ICD-10-CM | POA: Diagnosis not present

## 2015-06-16 DIAGNOSIS — Z888 Allergy status to other drugs, medicaments and biological substances status: Secondary | ICD-10-CM | POA: Diagnosis not present

## 2015-06-16 DIAGNOSIS — I2510 Atherosclerotic heart disease of native coronary artery without angina pectoris: Secondary | ICD-10-CM | POA: Diagnosis not present

## 2015-06-16 DIAGNOSIS — Y92009 Unspecified place in unspecified non-institutional (private) residence as the place of occurrence of the external cause: Secondary | ICD-10-CM | POA: Diagnosis not present

## 2015-06-16 DIAGNOSIS — T368X5A Adverse effect of other systemic antibiotics, initial encounter: Secondary | ICD-10-CM | POA: Diagnosis not present

## 2015-06-16 DIAGNOSIS — N289 Disorder of kidney and ureter, unspecified: Secondary | ICD-10-CM | POA: Diagnosis not present

## 2015-06-16 DIAGNOSIS — Z880 Allergy status to penicillin: Secondary | ICD-10-CM | POA: Diagnosis not present

## 2015-06-16 DIAGNOSIS — E860 Dehydration: Secondary | ICD-10-CM | POA: Diagnosis not present

## 2015-06-16 DIAGNOSIS — I10 Essential (primary) hypertension: Secondary | ICD-10-CM

## 2015-06-16 LAB — CBC
HEMATOCRIT: 32.6 % — AB (ref 39.0–52.0)
Hemoglobin: 10.8 g/dL — ABNORMAL LOW (ref 13.0–17.0)
MCH: 28.5 pg (ref 26.0–34.0)
MCHC: 33.1 g/dL (ref 30.0–36.0)
MCV: 86 fL (ref 78.0–100.0)
PLATELETS: 189 10*3/uL (ref 150–400)
RBC: 3.79 MIL/uL — AB (ref 4.22–5.81)
RDW: 13 % (ref 11.5–15.5)
WBC: 12.2 10*3/uL — AB (ref 4.0–10.5)

## 2015-06-16 LAB — COMPREHENSIVE METABOLIC PANEL
ALBUMIN: 3 g/dL — AB (ref 3.5–5.0)
ALT: 16 U/L — AB (ref 17–63)
AST: 18 U/L (ref 15–41)
Alkaline Phosphatase: 39 U/L (ref 38–126)
Anion gap: 6 (ref 5–15)
BILIRUBIN TOTAL: 0.7 mg/dL (ref 0.3–1.2)
BUN: 16 mg/dL (ref 6–20)
CHLORIDE: 105 mmol/L (ref 101–111)
CO2: 22 mmol/L (ref 22–32)
CREATININE: 1.32 mg/dL — AB (ref 0.61–1.24)
Calcium: 8.1 mg/dL — ABNORMAL LOW (ref 8.9–10.3)
GFR calc Af Amer: >60 mL/min (ref >60)
GFR, EST NON AFRICAN AMERICAN: 52 mL/min — AB (ref >60)
GLUCOSE: 176 mg/dL — AB (ref 65–99)
POTASSIUM: 3.7 mmol/L (ref 3.5–5.1)
Sodium: 133 mmol/L — ABNORMAL LOW (ref 135–145)
Total Protein: 5.9 g/dL — ABNORMAL LOW (ref 6.5–8.1)

## 2015-06-16 LAB — GLUCOSE, CAPILLARY
Glucose-Capillary: 161 mg/dL — ABNORMAL HIGH (ref 65–99)
Glucose-Capillary: 126 mg/dL — ABNORMAL HIGH (ref 65–99)
Glucose-Capillary: 162 mg/dL — ABNORMAL HIGH (ref 65–99)

## 2015-06-16 MED ORDER — CIPROFLOXACIN HCL 500 MG PO TABS: 500.0000 mg | ORAL_TABLET | Freq: Two times a day (BID) | ORAL | Status: DC

## 2015-06-16 MED ORDER — CEPHALEXIN 500 MG PO CAPS: 500.0000 mg | ORAL_CAPSULE | Freq: Three times a day (TID) | ORAL | Status: DC

## 2015-06-16 NOTE — Plan of Care (Signed)
Problem: Nutrition: Goal: Adequate nutrition will be maintained Outcome: Progressing Notified MD of the patient requesting to advance his diet to solids.  He voices that he had no nausea or vomiting overnight, nor this am after his breakfast.  Waiting on the MD response.

## 2015-06-16 NOTE — Discharge Summary (Signed)
Physician Discharge Summary  Craig Taylor TKZ:[id number] DOB: [birth year] DOA: 06/15/2015  PCP: Glo Herring., MD  Admit date: 06/15/2015 Discharge date: 06/16/2015  Time spent: 35 minutes  Recommendations for Outpatient Follow-up:  1. Follow up with PCP in 1-2 weeks   Discharge Diagnoses:  Active Problems:   Essential hypertension   Type 2 diabetes mellitus ([place])   Coronary artery disease   Urinary tract infectious disease   UTI (lower urinary tract infection)   Discharge Condition: Improved  Diet recommendation: Heart healthy  Filed Weights   06/15/15 1751 06/15/15 2247  Weight: 118.842 kg (262 lb) 117.663 kg (259 lb 6.4 oz)    History of present illness:  This is a 74 year old man, diet controlled diabetic, who presents with 24-hour history of nausea and vomiting. He had gone to the urgent care yesterday evening because of symptoms of dysuria and increased frequency of micturition. He was diagnosed with UTI and started on oral ciprofloxacin. Unfortunately, he not tolerate the ciprofloxacin and started vomiting almost intractably yesterday and today. He denies any fever. There is no hematemesis. There is no diarrhea. He is now being admitted because he is unable to stop the vomiting. During his emergency room visit, intravenous metoclopramide seems to have helped him the best.  Hospital Course:  Patient was admitted for a intractable nausea and vomiting likely due to UTI/adverse reaction to Cipro. He was placed on IV Rocephin and remained afebrile. Leukocytosis is trending down. Since he had an adverse reaction to cipro we will be transitioned to PO keflex upon discharge. Unfortunately, urine culture was not sent on admission, and therefore we will treat him empirically 1. Nausea and vomiting, possibly related to abx reaction. Improved with antiemetics. Appears to be tolerating solid diet without any difficulties.  2. Possible AKI although may have element of CKD,  improving. Creatinine remained stable during hospitalization. Continue on enalapril. IVF was given.  3. Diabetes type 2. Blood sugars remained stable.  4. Essential hypertension. Home medications were continued 5. Coronary artery disease. Stable. 6. HLD, stable  Procedures:  None  Consultations:  None  Discharge Exam: Filed Vitals:   06/16/15 0602  BP: 126/62  Pulse: 75  Temp: 98 F (36.7 C)  Resp: 16    General: Appears calm and comfortable Cardiovascular: RRR, no m/r/g Respiratory: CTAB, no w/r/r  Discharge Instructions   Current Discharge Medication List    CONTINUE these medications which have NOT CHANGED   Details  aspirin EC 81 MG tablet Take 81 mg by mouth daily.    Cinnamon 500 MG capsule Take 500 mg by mouth daily.    Cod Liver Oil CAPS Take 1 capsule by mouth daily.    enalapril (VASOTEC) 2.5 MG tablet Take 2.5 mg by mouth daily.    fenofibrate micronized (LOFIBRA) 134 MG capsule Take 1 capsule (134 mg total) by mouth daily before breakfast. Qty: 90 capsule, Refills: 2    ibuprofen (ADVIL,MOTRIN) 200 MG tablet Take 400 mg by mouth every 6 (six) hours as needed for headache.    metoprolol (LOPRESSOR) 50 MG tablet Take 1 tablet (50 mg total) by mouth 2 (two) times daily. Qty: 60 tablet, Refills: 3    nitroGLYCERIN (NITROSTAT) 0.4 MG SL tablet Place 1 tablet (0.4 mg total) under the tongue every 5 (five) minutes as needed for chest pain. Qty: 25 tablet, Refills: 3    simvastatin (ZOCOR) 20 MG tablet Take 1 tablet (20 mg total) by mouth daily. Qty: 90 tablet, Refills: 3  Allergies  Allergen Reactions  . Lipitor [Atorvastatin]     pruritis  . Penicillins   . Voltaren [Diclofenac Sodium]       The results of significant diagnostics from this hospitalization (including imaging, microbiology, ancillary and laboratory) are listed below for reference.    Labs: Basic Metabolic Panel:  Recent Labs Lab 06/15/15 1825 06/16/15 0649  NA  132* 133*  K 3.8 3.7  CL 100* 105  CO2 20* 22  GLUCOSE 178* 176*  BUN 17 16  CREATININE 1.39* 1.32*  CALCIUM 9.1 8.1*   Liver Function Tests:  Recent Labs Lab 06/15/15 1825 06/16/15 0649  AST 24 18  ALT 20 16*  ALKPHOS 46 39  BILITOT 1.5* 0.7  PROT 7.2 5.9*  ALBUMIN 3.6 3.0*   CBC:  Recent Labs Lab 06/15/15 1825 06/16/15 0649  WBC 18.9* 12.2*  NEUTROABS 16.8*  --   HGB 12.5* 10.8*  HCT 36.9* 32.6*  MCV 84.6 86.0  PLT 250 189    CBG:  Recent Labs Lab 06/15/15 1755 06/15/15 2255 06/16/15 0746 06/16/15 1142  GLUCAP 150* 129* 161* 126*     Signed: Kathie Dike, MD [facility] 06/16/2015, 1:15 PM  By signing my name below, I, Rosalie Doctor, attest that this documentation has been prepared under the direction and in the presence of [facility]. MD Electronically Signed: Rosalie Doctor, Scribe. 1:10 PM   I, Dr. Kathie Dike, personally performed the services described in this documentaiton. All medical record entries made by the scribe were at my direction and in my presence. I have reviewed the chart and agree that the record reflects my personal performance and is accurate and complete  Kathie Dike, MD, 06/16/2015 5:32 PM

## 2015-07-18 ENCOUNTER — Other Ambulatory Visit: Payer: Self-pay | Admitting: Cardiovascular Disease

## 2015-07-26 ENCOUNTER — Encounter: Payer: Self-pay | Admitting: Cardiovascular Disease

## 2015-07-26 ENCOUNTER — Encounter: Payer: Self-pay | Admitting: *Deleted

## 2015-07-26 ENCOUNTER — Ambulatory Visit (INDEPENDENT_AMBULATORY_CARE_PROVIDER_SITE_OTHER): Payer: PPO | Admitting: Cardiovascular Disease

## 2015-07-26 VITALS — BP 115/68 | HR 61 | Ht 75.0 in | Wt 266.0 lb

## 2015-07-26 DIAGNOSIS — I10 Essential (primary) hypertension: Secondary | ICD-10-CM

## 2015-07-26 DIAGNOSIS — I2510 Atherosclerotic heart disease of native coronary artery without angina pectoris: Secondary | ICD-10-CM

## 2015-07-26 DIAGNOSIS — E785 Hyperlipidemia, unspecified: Secondary | ICD-10-CM

## 2015-07-26 NOTE — Patient Instructions (Signed)
Continue all current medications. Your physician wants you to follow up in:  1 year.  You will receive a reminder letter in the mail one-two months in advance.  If you don't receive a letter, please call our office to schedule the follow up appointment   

## 2015-07-26 NOTE — Progress Notes (Signed)
Patient ID: Craig Taylor, male   DOB: Jul 12, 1941, 74 y.o.   MRN: [id number]      SUBJECTIVE: The patient presents for routine cardiovascular follow-up. He underwent RCA stenting in 2003 and also has hypertension, hyperlipidemia, and diabetes mellitus. He denies chest pain, leg swelling, and shortness of breath. Hospitalized with a UTI in November. Primary problem relates to belching and flatus with certain foods. He has 2 daughters, one in [place] and another in [place], who help to take care of him.  ECG today shows sinus rhythm with a nonspecific intraventricular conduction delay with no ischemic ST-T abnormalities.  Review of Systems: As per "subjective", otherwise negative.  Allergies  Allergen Reactions  . Lipitor [Atorvastatin]     pruritis  . Penicillins   . Voltaren [Diclofenac Sodium]     Current Outpatient Prescriptions  Medication Sig Dispense Refill  . aspirin EC 81 MG tablet Take 81 mg by mouth daily.    . Cinnamon 500 MG capsule Take 500 mg by mouth daily.    Marland Kitchen dutasteride (AVODART) 0.5 MG capsule Take 0.5 mg by mouth daily.    . enalapril (VASOTEC) 2.5 MG tablet Take 2.5 mg by mouth daily.    . fenofibrate micronized (LOFIBRA) 134 MG capsule Take 1 capsule (134 mg total) by mouth daily before breakfast. 90 capsule 2  . Fish Oil-Cholecalciferol (FISH OIL + D3 PO) Take 1 capsule by mouth 2 (two) times daily.    . metFORMIN (GLUCOPHAGE) 500 MG tablet Take 500 mg by mouth 2 (two) times daily with a meal.    . metoprolol (LOPRESSOR) 50 MG tablet TAKE ONE TABLET (50 MG TOTAL) BY MOUTH TWICE DAILY 60 tablet 3  . nitroGLYCERIN (NITROSTAT) 0.4 MG SL tablet Place 1 tablet (0.4 mg total) under the tongue every 5 (five) minutes as needed for chest pain. 25 tablet 3  . simvastatin (ZOCOR) 20 MG tablet Take 1 tablet (20 mg total) by mouth daily. 90 tablet 3   No current facility-administered medications for this visit.    Past Medical History  Diagnosis Date  .  Hypertension   . Hyperlipidemia   . Non-insulin dependent type 2 diabetes mellitus ([place])   . Coronary artery disease     Past Surgical History  Procedure Laterality Date  . Carotid doppler  09/07/2009    No evidence of bilateral diameter reduction, significant tortuosity, or any other vascular abnormality  . Cardiac catheterization  08/03/2002    No intervention  . Cardiac catheterization  08/05/2002    Crux of RCA 95% stenosis, stented with a 2.5x54mm CYPHER stent resulting in reduction of 95% stenosis to 0% residual; overlapping CYPHER stents -3x42mm and 3x71mm- deployed at 15atm resulting in reduction of 70% stenosis to 0% residual.  . Cardiac catheterization  10/04/2002    No intervention  . Cardiovascular stress test  09/07/2009    Perfusion defect seen in inferior region consistent with diaphragmatic attenuation. Remaining myocardium demonstrates normal myocardial perfusion with no evidence of ischemia or infarct  . Transthoracic echocardiogram  03/30/2006    EF 50-55%, LA moderate-severely dilated,   . Colonoscopy    . Colonoscopy N/A 05/31/2014    Procedure: COLONOSCOPY;  Surgeon: Daneil Dolin, MD;  Location[facility];  Service: Endoscopy;  Laterality: N/A;  7:30 Am    Social History   Social History  . Marital Status: Married    Spouse Name: N/A  . Number of Children: N/A  . Years of Education: N/A   Occupational  History  . Not on file.   Social History Main Topics  . Smoking status: Former Smoker -- 2.00 packs/day for 21 years    Types: Cigarettes    Start date: 07/04/1957    Quit date: 08/09/1983  . Smokeless tobacco: Former Systems developer    Quit date: 08/11/2012     Comment: quit chewing tobacco aug 2014  . Alcohol Use: No  . Drug Use: No  . Sexual Activity: Not on file   Other Topics Concern  . Not on file   Social History Narrative     Filed Vitals:   07/26/15 0814  BP: 115/68  Pulse: 61  Height: 6\' 3"  (1.905 m)  Weight: 266 lb (120.657 kg)     PHYSICAL EXAM General: NAD HEENT: Normal. Neck: No JVD, no thyromegaly. Lungs: Clear to auscultation bilaterally with normal respiratory effort. CV: Nondisplaced PMI.  Regular rate and rhythm, normal S1/S2, no S3/S4, no murmur. No pretibial or periankle edema.  No carotid bruit.    Abdomen: Obese.  Neurologic: Alert and oriented x 3.  Psych: Normal affect. Skin: Normal. Musculoskeletal: Normal range of motion, no gross deformities. Extremities: No clubbing or cyanosis.   ECG: Most recent ECG reviewed.      ASSESSMENT AND PLAN: 1. CAD: Stable ischemic heart disease. Continue ASA, metoprolol and simvastatin.   2. Essential HTN: Well controlled on current therapy. No changes.  3. Hyperlipidemia: HDL 25, LDL 81 in 02/2014. Will repeat lipids. Continue simvastatin 20 mg.  4. Type 2 diabetes: On metformin.  5. AAA: Normal ultrasound in 07/2014.  Dispo: f/u 1 year.  Kate Sable, M.D., F.A.C.C.

## 2015-11-01 DIAGNOSIS — H2513 Age-related nuclear cataract, bilateral: Secondary | ICD-10-CM | POA: Diagnosis not present

## 2015-11-01 DIAGNOSIS — H40013 Open angle with borderline findings, low risk, bilateral: Secondary | ICD-10-CM | POA: Diagnosis not present

## 2015-11-01 DIAGNOSIS — H10413 Chronic giant papillary conjunctivitis, bilateral: Secondary | ICD-10-CM | POA: Diagnosis not present

## 2015-11-13 DIAGNOSIS — H10413 Chronic giant papillary conjunctivitis, bilateral: Secondary | ICD-10-CM | POA: Diagnosis not present

## 2015-11-13 DIAGNOSIS — H2513 Age-related nuclear cataract, bilateral: Secondary | ICD-10-CM | POA: Diagnosis not present

## 2015-11-13 DIAGNOSIS — H40012 Open angle with borderline findings, low risk, left eye: Secondary | ICD-10-CM | POA: Diagnosis not present

## 2015-11-13 DIAGNOSIS — H401112 Primary open-angle glaucoma, right eye, moderate stage: Secondary | ICD-10-CM | POA: Diagnosis not present

## 2015-11-17 ENCOUNTER — Other Ambulatory Visit: Payer: Self-pay | Admitting: Cardiovascular Disease

## 2015-11-22 DIAGNOSIS — Z0000 Encounter for general adult medical examination without abnormal findings: Secondary | ICD-10-CM | POA: Diagnosis not present

## 2015-11-22 DIAGNOSIS — Z6831 Body mass index (BMI) 31.0-31.9, adult: Secondary | ICD-10-CM | POA: Diagnosis not present

## 2015-11-22 DIAGNOSIS — E6609 Other obesity due to excess calories: Secondary | ICD-10-CM | POA: Diagnosis not present

## 2015-11-22 DIAGNOSIS — Z1389 Encounter for screening for other disorder: Secondary | ICD-10-CM | POA: Diagnosis not present

## 2015-11-22 DIAGNOSIS — E1165 Type 2 diabetes mellitus with hyperglycemia: Secondary | ICD-10-CM | POA: Diagnosis not present

## 2015-12-28 ENCOUNTER — Other Ambulatory Visit: Payer: Self-pay

## 2015-12-30 ENCOUNTER — Other Ambulatory Visit: Payer: Self-pay | Admitting: Cardiovascular Disease

## 2016-01-04 DIAGNOSIS — H2513 Age-related nuclear cataract, bilateral: Secondary | ICD-10-CM | POA: Diagnosis not present

## 2016-01-04 DIAGNOSIS — H10413 Chronic giant papillary conjunctivitis, bilateral: Secondary | ICD-10-CM | POA: Diagnosis not present

## 2016-01-04 DIAGNOSIS — H401112 Primary open-angle glaucoma, right eye, moderate stage: Secondary | ICD-10-CM | POA: Diagnosis not present

## 2016-01-04 DIAGNOSIS — H40013 Open angle with borderline findings, low risk, bilateral: Secondary | ICD-10-CM | POA: Diagnosis not present

## 2016-01-17 DIAGNOSIS — D485 Neoplasm of uncertain behavior of skin: Secondary | ICD-10-CM | POA: Diagnosis not present

## 2016-01-17 DIAGNOSIS — C4441 Basal cell carcinoma of skin of scalp and neck: Secondary | ICD-10-CM | POA: Diagnosis not present

## 2016-01-17 DIAGNOSIS — D0439 Carcinoma in situ of skin of other parts of face: Secondary | ICD-10-CM | POA: Diagnosis not present

## 2016-01-17 DIAGNOSIS — C44219 Basal cell carcinoma of skin of left ear and external auricular canal: Secondary | ICD-10-CM | POA: Diagnosis not present

## 2016-01-17 DIAGNOSIS — L910 Hypertrophic scar: Secondary | ICD-10-CM | POA: Diagnosis not present

## 2016-02-15 DIAGNOSIS — H40013 Open angle with borderline findings, low risk, bilateral: Secondary | ICD-10-CM | POA: Diagnosis not present

## 2016-02-15 DIAGNOSIS — H10413 Chronic giant papillary conjunctivitis, bilateral: Secondary | ICD-10-CM | POA: Diagnosis not present

## 2016-02-15 DIAGNOSIS — H2513 Age-related nuclear cataract, bilateral: Secondary | ICD-10-CM | POA: Diagnosis not present

## 2016-02-20 DIAGNOSIS — Z85828 Personal history of other malignant neoplasm of skin: Secondary | ICD-10-CM | POA: Diagnosis not present

## 2016-02-20 DIAGNOSIS — C44219 Basal cell carcinoma of skin of left ear and external auricular canal: Secondary | ICD-10-CM | POA: Diagnosis not present

## 2016-02-22 DIAGNOSIS — L905 Scar conditions and fibrosis of skin: Secondary | ICD-10-CM | POA: Diagnosis not present

## 2016-02-22 DIAGNOSIS — D485 Neoplasm of uncertain behavior of skin: Secondary | ICD-10-CM | POA: Diagnosis not present

## 2016-02-22 DIAGNOSIS — D0439 Carcinoma in situ of skin of other parts of face: Secondary | ICD-10-CM | POA: Diagnosis not present

## 2016-02-29 DIAGNOSIS — C4441 Basal cell carcinoma of skin of scalp and neck: Secondary | ICD-10-CM | POA: Diagnosis not present

## 2016-02-29 DIAGNOSIS — D0439 Carcinoma in situ of skin of other parts of face: Secondary | ICD-10-CM | POA: Diagnosis not present

## 2016-03-13 ENCOUNTER — Encounter: Payer: Self-pay | Admitting: Cardiovascular Disease

## 2016-03-13 ENCOUNTER — Ambulatory Visit (INDEPENDENT_AMBULATORY_CARE_PROVIDER_SITE_OTHER): Payer: PPO | Admitting: Cardiovascular Disease

## 2016-03-13 ENCOUNTER — Encounter (INDEPENDENT_AMBULATORY_CARE_PROVIDER_SITE_OTHER): Payer: Self-pay

## 2016-03-13 VITALS — BP 130/68 | HR 71 | Ht 77.0 in | Wt 265.0 lb

## 2016-03-13 DIAGNOSIS — E785 Hyperlipidemia, unspecified: Secondary | ICD-10-CM | POA: Diagnosis not present

## 2016-03-13 DIAGNOSIS — I10 Essential (primary) hypertension: Secondary | ICD-10-CM | POA: Diagnosis not present

## 2016-03-13 DIAGNOSIS — I779 Disorder of arteries and arterioles, unspecified: Secondary | ICD-10-CM

## 2016-03-13 DIAGNOSIS — I2510 Atherosclerotic heart disease of native coronary artery without angina pectoris: Secondary | ICD-10-CM | POA: Diagnosis not present

## 2016-03-13 DIAGNOSIS — I739 Peripheral vascular disease, unspecified: Principal | ICD-10-CM

## 2016-03-13 NOTE — Patient Instructions (Signed)
Medication Instructions:  Your physician recommends that you continue on your current medications as directed. Please refer to the Current Medication list given to you today.   Labwork: NONE  Testing/Procedures: Your physician has requested that you have a carotid duplex. This test is an ultrasound of the carotid arteries in your neck. It looks at blood flow through these arteries that supply the brain with blood. Allow one hour for this exam. There are no restrictions or special instructions.    Follow-Up: Your physician wants you to follow-up in: 1 YEAR.  You will receive a reminder letter in the mail two months in advance. If you don't receive a letter, please call our office to schedule the follow-up appointment.   Any Other Special Instructions Will Be Listed Below (If Applicable).     If you need a refill on your cardiac medications before your next appointment, please call your pharmacy.   

## 2016-03-13 NOTE — Progress Notes (Signed)
SUBJECTIVE: The patient presents for early cardiovascular follow-up. He underwent RCA stenting in 2003 and also has hypertension, hyperlipidemia, and diabetes mellitus. He denies chest pain, leg swelling, and shortness of breath. Said he was told by his eye doctor that he had a right-sided retinal hemorrhage and should have his carotid arteries evaluated. He said it has since cleared up.  Soc: He has 2 daughters, one in [place] and another in [place], who help to take care of him.   Review of Systems: As per "subjective", otherwise negative.  Allergies  Allergen Reactions  . Lipitor [Atorvastatin]     pruritis  . Penicillins   . Voltaren [Diclofenac Sodium]     Current Outpatient Prescriptions  Medication Sig Dispense Refill  . aspirin EC 81 MG tablet Take 81 mg by mouth daily.    . Cinnamon 500 MG capsule Take 500 mg by mouth daily.    Marland Kitchen dutasteride (AVODART) 0.5 MG capsule Take 0.5 mg by mouth daily.    . enalapril (VASOTEC) 2.5 MG tablet Take 2.5 mg by mouth daily.    . fenofibrate micronized (LOFIBRA) 134 MG capsule Take 1 capsule (134 mg total) by mouth daily before breakfast. 90 capsule 2  . Fish Oil-Cholecalciferol (FISH OIL + D3 PO) Take 1 capsule by mouth 2 (two) times daily.    . metFORMIN (GLUCOPHAGE) 500 MG tablet Take 500 mg by mouth 2 (two) times daily with a meal.    . metoprolol (LOPRESSOR) 50 MG tablet TAKE ONE TABLET BY MOUTH TWICE DAILY 60 tablet 6  . nitroGLYCERIN (NITROSTAT) 0.4 MG SL tablet Place 1 tablet (0.4 mg total) under the tongue every 5 (five) minutes as needed for chest pain. 25 tablet 3  . simvastatin (ZOCOR) 20 MG tablet Take by mouth.     No current facility-administered medications for this visit.     Past Medical History:  Diagnosis Date  . Coronary artery disease   . Hyperlipidemia   . Hypertension   . Non-insulin dependent type 2 diabetes mellitus [facility])     Past Surgical History:  Procedure Laterality Date  . CARDIAC  CATHETERIZATION  08/03/2002   No intervention  . CARDIAC CATHETERIZATION  08/05/2002   Crux of RCA 95% stenosis, stented with a 2.5x50mm CYPHER stent resulting in reduction of 95% stenosis to 0% residual; overlapping CYPHER stents -3x80mm and 3x34mm- deployed at 15atm resulting in reduction of 70% stenosis to 0% residual.  . CARDIAC CATHETERIZATION  10/04/2002   No intervention  . CARDIOVASCULAR STRESS TEST  09/07/2009   Perfusion defect seen in inferior region consistent with diaphragmatic attenuation. Remaining myocardium demonstrates normal myocardial perfusion with no evidence of ischemia or infarct  . CAROTID DOPPLER  09/07/2009   No evidence of bilateral diameter reduction, significant tortuosity, or any other vascular abnormality  . Colonoscopy    . COLONOSCOPY N/A 05/31/2014   Procedure: COLONOSCOPY;  Surgeon: Daneil Dolin, MD;  Location: [facility];  Service: Endoscopy;  Laterality: N/A;  7:30 Am  . TRANSTHORACIC ECHOCARDIOGRAM  03/30/2006   EF 50-55%, LA moderate-severely dilated,     Social History   Social History  . Marital status: Married    Spouse name: N/A  . Number of children: N/A  . Years of education: N/A   Occupational History  . Not on file.   Social History Main Topics  . Smoking status: Former Smoker    Packs/day: 2.00    Years: 21.00    Types: Cigarettes  Start date: 07/04/1957    Quit date: 08/09/1983  . Smokeless tobacco: Former Systems developer    Quit date: 08/11/2012     Comment: quit chewing tobacco aug 2014  . Alcohol use No  . Drug use: No  . Sexual activity: No   Other Topics Concern  . Not on file   Social History Narrative  . No narrative on file     Vitals:   03/13/16 1504  BP: 130/68  Pulse: 71  SpO2: 94%  Weight: 265 lb (120.2 kg)  Height: 6\' 5"  (1.956 m)    PHYSICAL EXAM General: NAD HEENT: Normal. Neck: No JVD, no thyromegaly. Lungs: Clear to auscultation bilaterally with normal respiratory effort. CV: Nondisplaced PMI.   Regular rate and rhythm, normal S1/S2, no S3/S4, no murmur. No pretibial or periankle edema.  No carotid bruit.   Abdomen: Soft, nontender, no distention.  Neurologic: Alert and oriented.  Psych: Normal affect. Skin: Normal. Musculoskeletal: No gross deformities.    ECG: Most recent ECG reviewed.      ASSESSMENT AND PLAN: 1. CAD: Stable ischemic heart disease. Continue ASA, metoprolol and simvastatin.   2. Essential HTN: Well controlled on current therapy. No changes.  3. Hyperlipidemia: Obtain copy of lipids from PCP. Continue simvastatin 20 mg.  4. Retinal hemorrhage: Will evaluate for carotid artery disease with Dopplers.  Dispo: fu 1 year.  Kate Sable, M.D., F.A.C.C.

## 2016-03-14 ENCOUNTER — Ambulatory Visit (HOSPITAL_COMMUNITY)
Admission: RE | Admit: 2016-03-14 | Discharge: 2016-03-14 | Disposition: A | Discharge status: Home / Self Care | Payer: PPO | Source: Ambulatory Visit | Attending: Cardiovascular Disease | Admitting: Cardiovascular Disease

## 2016-03-14 DIAGNOSIS — I779 Disorder of arteries and arterioles, unspecified: Secondary | ICD-10-CM | POA: Diagnosis not present

## 2016-03-14 DIAGNOSIS — I6521 Occlusion and stenosis of right carotid artery: Secondary | ICD-10-CM | POA: Diagnosis not present

## 2016-05-13 DIAGNOSIS — Z024 Encounter for examination for driving license: Secondary | ICD-10-CM | POA: Diagnosis not present

## 2016-05-13 DIAGNOSIS — Z1389 Encounter for screening for other disorder: Secondary | ICD-10-CM | POA: Diagnosis not present

## 2016-05-13 DIAGNOSIS — E6609 Other obesity due to excess calories: Secondary | ICD-10-CM | POA: Diagnosis not present

## 2016-05-13 DIAGNOSIS — I2510 Atherosclerotic heart disease of native coronary artery without angina pectoris: Secondary | ICD-10-CM | POA: Diagnosis not present

## 2016-05-13 DIAGNOSIS — I10 Essential (primary) hypertension: Secondary | ICD-10-CM | POA: Diagnosis not present

## 2016-05-13 DIAGNOSIS — E782 Mixed hyperlipidemia: Secondary | ICD-10-CM | POA: Diagnosis not present

## 2016-05-13 DIAGNOSIS — Z6834 Body mass index (BMI) 34.0-34.9, adult: Secondary | ICD-10-CM | POA: Diagnosis not present

## 2016-05-13 DIAGNOSIS — E119 Type 2 diabetes mellitus without complications: Secondary | ICD-10-CM | POA: Diagnosis not present

## 2016-06-13 DIAGNOSIS — X32XXXA Exposure to sunlight, initial encounter: Secondary | ICD-10-CM | POA: Diagnosis not present

## 2016-06-13 DIAGNOSIS — D0461 Carcinoma in situ of skin of right upper limb, including shoulder: Secondary | ICD-10-CM | POA: Diagnosis not present

## 2016-06-13 DIAGNOSIS — L570 Actinic keratosis: Secondary | ICD-10-CM | POA: Diagnosis not present

## 2016-06-13 DIAGNOSIS — D485 Neoplasm of uncertain behavior of skin: Secondary | ICD-10-CM | POA: Diagnosis not present

## 2016-06-26 ENCOUNTER — Other Ambulatory Visit: Payer: Self-pay | Admitting: Cardiovascular Disease

## 2016-07-18 DIAGNOSIS — D0461 Carcinoma in situ of skin of right upper limb, including shoulder: Secondary | ICD-10-CM | POA: Diagnosis not present

## 2016-08-14 DIAGNOSIS — E6609 Other obesity due to excess calories: Secondary | ICD-10-CM | POA: Diagnosis not present

## 2016-08-14 DIAGNOSIS — Z1389 Encounter for screening for other disorder: Secondary | ICD-10-CM | POA: Diagnosis not present

## 2016-08-14 DIAGNOSIS — E1165 Type 2 diabetes mellitus with hyperglycemia: Secondary | ICD-10-CM | POA: Diagnosis not present

## 2016-08-14 DIAGNOSIS — E782 Mixed hyperlipidemia: Secondary | ICD-10-CM | POA: Diagnosis not present

## 2016-08-14 DIAGNOSIS — Z6833 Body mass index (BMI) 33.0-33.9, adult: Secondary | ICD-10-CM | POA: Diagnosis not present

## 2016-08-14 DIAGNOSIS — I10 Essential (primary) hypertension: Secondary | ICD-10-CM | POA: Diagnosis not present

## 2016-08-22 DIAGNOSIS — H2513 Age-related nuclear cataract, bilateral: Secondary | ICD-10-CM | POA: Diagnosis not present

## 2016-08-22 DIAGNOSIS — H401111 Primary open-angle glaucoma, right eye, mild stage: Secondary | ICD-10-CM | POA: Diagnosis not present

## 2016-08-22 DIAGNOSIS — H10413 Chronic giant papillary conjunctivitis, bilateral: Secondary | ICD-10-CM | POA: Diagnosis not present

## 2016-08-22 DIAGNOSIS — H40013 Open angle with borderline findings, low risk, bilateral: Secondary | ICD-10-CM | POA: Diagnosis not present

## 2016-10-09 DIAGNOSIS — J209 Acute bronchitis, unspecified: Secondary | ICD-10-CM | POA: Diagnosis not present

## 2016-10-09 DIAGNOSIS — I10 Essential (primary) hypertension: Secondary | ICD-10-CM | POA: Diagnosis not present

## 2016-10-09 DIAGNOSIS — J040 Acute laryngitis: Secondary | ICD-10-CM | POA: Diagnosis not present

## 2016-10-09 DIAGNOSIS — J343 Hypertrophy of nasal turbinates: Secondary | ICD-10-CM | POA: Diagnosis not present

## 2016-10-09 DIAGNOSIS — E1165 Type 2 diabetes mellitus with hyperglycemia: Secondary | ICD-10-CM | POA: Diagnosis not present

## 2016-10-09 DIAGNOSIS — E782 Mixed hyperlipidemia: Secondary | ICD-10-CM | POA: Diagnosis not present

## 2016-10-09 DIAGNOSIS — Z6833 Body mass index (BMI) 33.0-33.9, adult: Secondary | ICD-10-CM | POA: Diagnosis not present

## 2016-10-09 DIAGNOSIS — J069 Acute upper respiratory infection, unspecified: Secondary | ICD-10-CM | POA: Diagnosis not present

## 2016-11-04 ENCOUNTER — Other Ambulatory Visit: Payer: Self-pay | Admitting: Cardiovascular Disease

## 2016-11-28 DIAGNOSIS — L821 Other seborrheic keratosis: Secondary | ICD-10-CM | POA: Diagnosis not present

## 2016-11-28 DIAGNOSIS — Z08 Encounter for follow-up examination after completed treatment for malignant neoplasm: Secondary | ICD-10-CM | POA: Diagnosis not present

## 2016-11-28 DIAGNOSIS — Z85828 Personal history of other malignant neoplasm of skin: Secondary | ICD-10-CM | POA: Diagnosis not present

## 2017-01-09 ENCOUNTER — Other Ambulatory Visit: Payer: Self-pay | Admitting: *Deleted

## 2017-01-09 MED ORDER — SIMVASTATIN 20 MG PO TABS: 20.0000 mg | ORAL_TABLET | Freq: Every day | ORAL | 1 refills | Status: DC

## 2017-01-30 ENCOUNTER — Other Ambulatory Visit: Payer: Self-pay | Admitting: Cardiovascular Disease

## 2017-02-20 DIAGNOSIS — H40012 Open angle with borderline findings, low risk, left eye: Secondary | ICD-10-CM | POA: Diagnosis not present

## 2017-02-20 DIAGNOSIS — H401112 Primary open-angle glaucoma, right eye, moderate stage: Secondary | ICD-10-CM | POA: Diagnosis not present

## 2017-02-20 DIAGNOSIS — H2513 Age-related nuclear cataract, bilateral: Secondary | ICD-10-CM | POA: Diagnosis not present

## 2017-02-20 DIAGNOSIS — H40011 Open angle with borderline findings, low risk, right eye: Secondary | ICD-10-CM | POA: Diagnosis not present

## 2017-02-20 DIAGNOSIS — H10413 Chronic giant papillary conjunctivitis, bilateral: Secondary | ICD-10-CM | POA: Diagnosis not present

## 2017-03-05 DIAGNOSIS — M542 Cervicalgia: Secondary | ICD-10-CM | POA: Diagnosis not present

## 2017-03-05 DIAGNOSIS — M9901 Segmental and somatic dysfunction of cervical region: Secondary | ICD-10-CM | POA: Diagnosis not present

## 2017-03-06 DIAGNOSIS — M9901 Segmental and somatic dysfunction of cervical region: Secondary | ICD-10-CM | POA: Diagnosis not present

## 2017-03-06 DIAGNOSIS — M542 Cervicalgia: Secondary | ICD-10-CM | POA: Diagnosis not present

## 2017-03-09 DIAGNOSIS — M542 Cervicalgia: Secondary | ICD-10-CM | POA: Diagnosis not present

## 2017-03-09 DIAGNOSIS — M9901 Segmental and somatic dysfunction of cervical region: Secondary | ICD-10-CM | POA: Diagnosis not present

## 2017-03-12 ENCOUNTER — Encounter: Payer: Self-pay | Admitting: Cardiovascular Disease

## 2017-03-12 ENCOUNTER — Ambulatory Visit (INDEPENDENT_AMBULATORY_CARE_PROVIDER_SITE_OTHER): Payer: PPO | Admitting: Cardiovascular Disease

## 2017-03-12 VITALS — BP 101/55 | HR 68 | Ht 77.0 in | Wt 258.0 lb

## 2017-03-12 DIAGNOSIS — M542 Cervicalgia: Secondary | ICD-10-CM | POA: Diagnosis not present

## 2017-03-12 DIAGNOSIS — I10 Essential (primary) hypertension: Secondary | ICD-10-CM | POA: Diagnosis not present

## 2017-03-12 DIAGNOSIS — E782 Mixed hyperlipidemia: Secondary | ICD-10-CM | POA: Diagnosis not present

## 2017-03-12 DIAGNOSIS — I25118 Atherosclerotic heart disease of native coronary artery with other forms of angina pectoris: Secondary | ICD-10-CM

## 2017-03-12 DIAGNOSIS — M9901 Segmental and somatic dysfunction of cervical region: Secondary | ICD-10-CM | POA: Diagnosis not present

## 2017-03-12 NOTE — Progress Notes (Signed)
SUBJECTIVE: The patient resents her follow-up for coronary artery disease. He underwent RCA stenting in 2003 and also has hypertension, hyperlipidemia, and diabetes mellitus.  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  The patient denies any symptoms of chest pain, palpitations, shortness of breath, lightheadedness, dizziness, leg swelling, orthopnea, PND, and syncope.    Soc: He has 2 daughters, one in [place] and another in [place], who help to take care of him.   Review of Systems: As per "subjective", otherwise negative.  Allergies  Allergen Reactions  . Lipitor [Atorvastatin]     pruritis  . Penicillins   . Voltaren [Diclofenac Sodium]     Current Outpatient Prescriptions  Medication Sig Dispense Refill  . aspirin EC 81 MG tablet Take 81 mg by mouth daily.    . Cinnamon 500 MG capsule Take 500 mg by mouth daily.    Marland Kitchen dutasteride (AVODART) 0.5 MG capsule Take 0.5 mg by mouth daily.    . enalapril (VASOTEC) 2.5 MG tablet Take 2.5 mg by mouth daily.    . fenofibrate micronized (LOFIBRA) 134 MG capsule Take 1 capsule (134 mg total) by mouth daily before breakfast. 90 capsule 2  . JANUVIA 100 MG tablet     . metFORMIN (GLUCOPHAGE) 500 MG tablet Take 500 mg by mouth 2 (two) times daily with a meal.    . metoprolol tartrate (LOPRESSOR) 50 MG tablet TAKE ONE TABLET BY MOUTH TWICE DAILY 60 tablet 1  . nitroGLYCERIN (NITROSTAT) 0.4 MG SL tablet Place 1 tablet (0.4 mg total) under the tongue every 5 (five) minutes as needed for chest pain. 25 tablet 3  . Omega-3 Fatty Acids (FISH OIL PO) Take by mouth.    . simvastatin (ZOCOR) 20 MG tablet Take 1 tablet (20 mg total) by mouth daily. 90 tablet 1   No current facility-administered medications for this visit.     Past Medical History:  Diagnosis Date  . Coronary artery disease   . Hyperlipidemia    . Hypertension   . Non-insulin dependent type 2 diabetes mellitus [facility])     Past Surgical History:  Procedure Laterality Date  . CARDIAC CATHETERIZATION  08/03/2002   No intervention  . CARDIAC CATHETERIZATION  08/05/2002   Crux of RCA 95% stenosis, stented with a 2.5x13mm CYPHER stent resulting in reduction of 95% stenosis to 0% residual; overlapping CYPHER stents -3x70mm and 3x44mm- deployed at 15atm resulting in reduction of 70% stenosis to 0% residual.  . CARDIAC CATHETERIZATION  10/04/2002   No intervention  . CARDIOVASCULAR STRESS TEST  09/07/2009   Perfusion defect seen in inferior region consistent with diaphragmatic attenuation. Remaining myocardium demonstrates normal myocardial perfusion with no evidence of ischemia or infarct  . CAROTID DOPPLER  09/07/2009   No evidence of bilateral diameter reduction, significant tortuosity, or any other vascular abnormality  . Colonoscopy    . COLONOSCOPY N/A 05/31/2014   Procedure: COLONOSCOPY;  Surgeon: Daneil Dolin, MD;  [facility];  Service: Endoscopy;  Laterality: N/A;  7:30 Am  . TRANSTHORACIC ECHOCARDIOGRAM  03/30/2006   EF 50-55%, LA moderate-severely dilated,     Social History   Social History  . Marital status: Widowed    Spouse name: N/A  . Number of children: N/A  . Years of education: N/A   Occupational History  . Not on file.   Social History Main  Topics  . Smoking status: Former Smoker    Packs/day: 2.00    Years: 21.00    Types: Cigarettes    Start date: 07/04/1957    Quit date: 08/09/1983  . Smokeless tobacco: Former Systems developer    Quit date: 08/11/2012     Comment: quit chewing tobacco aug 2014  . Alcohol use No  . Drug use: No  . Sexual activity: No   Other Topics Concern  . Not on file   Social History Narrative  . No narrative on file     Vitals:   03/12/17 1022  BP: (!) 101/55  Pulse: 68  SpO2: 96%  Weight: 258 lb (117 kg)  Height: 6\' 5"  (1.956 m)    Wt Readings from Last 3  Encounters:  03/12/17 258 lb (117 kg)  03/13/16 265 lb (120.2 kg)  07/26/15 266 lb (120.7 kg)     PHYSICAL EXAM General: NAD HEENT: Normal. Neck: No JVD, no thyromegaly. Lungs: Clear to auscultation bilaterally with normal respiratory effort. CV: Nondisplaced PMI.  Regular rate and rhythm, normal S1/S2, no S3/S4, no murmur. No pretibial or periankle edema.  No carotid bruit.   Abdomen: Soft, nontender, no distention.  Neurologic: Alert and oriented.  Psych: Normal affect. Skin: Normal. Musculoskeletal: No gross deformities.    ECG: Most recent ECG reviewed.   Labs: Lab Results  Component Value Date/Time   K 3.7 06/16/2015 06:49 AM   BUN 16 06/16/2015 06:49 AM   CREATININE 1.32 (H) 06/16/2015 06:49 AM   ALT 16 (L) 06/16/2015 06:49 AM   HGB 10.8 (L) 06/16/2015 06:49 AM     Lipids: Lab Results  Component Value Date/Time   LDLCALC 81 03/07/2014 10:51 AM   CHOL 132 03/07/2014 10:51 AM   TRIG 129.0 03/07/2014 10:51 AM   HDL 25.20 (L) 03/07/2014 10:51 AM       ASSESSMENT AND PLAN:  1. CAD: Stable ischemic heart disease. Continue aspirin, metoprolol, and simvastatin.  2. Essential HTN: Controlled on present therapy. No changes.  3. Hyperlipidemia: Obtain copy of lipids from PCP. Continue simvastatin 20 mg.     Disposition: Follow up 1 yr   Kate Sable, M.D., F.A.C.C.

## 2017-03-12 NOTE — Patient Instructions (Signed)

## 2017-04-14 ENCOUNTER — Other Ambulatory Visit: Payer: Self-pay | Admitting: Cardiovascular Disease

## 2017-07-10 DIAGNOSIS — E6609 Other obesity due to excess calories: Secondary | ICD-10-CM | POA: Diagnosis not present

## 2017-07-10 DIAGNOSIS — Z1389 Encounter for screening for other disorder: Secondary | ICD-10-CM | POA: Diagnosis not present

## 2017-07-10 DIAGNOSIS — Z6832 Body mass index (BMI) 32.0-32.9, adult: Secondary | ICD-10-CM | POA: Diagnosis not present

## 2017-07-10 DIAGNOSIS — N200 Calculus of kidney: Secondary | ICD-10-CM | POA: Diagnosis not present

## 2017-07-10 DIAGNOSIS — I10 Essential (primary) hypertension: Secondary | ICD-10-CM | POA: Diagnosis not present

## 2017-07-10 DIAGNOSIS — E1165 Type 2 diabetes mellitus with hyperglycemia: Secondary | ICD-10-CM | POA: Diagnosis not present

## 2017-07-10 DIAGNOSIS — I2510 Atherosclerotic heart disease of native coronary artery without angina pectoris: Secondary | ICD-10-CM | POA: Diagnosis not present

## 2017-07-10 DIAGNOSIS — E782 Mixed hyperlipidemia: Secondary | ICD-10-CM | POA: Diagnosis not present

## 2017-07-30 DIAGNOSIS — R944 Abnormal results of kidney function studies: Secondary | ICD-10-CM | POA: Diagnosis not present

## 2017-08-21 ENCOUNTER — Other Ambulatory Visit: Payer: Self-pay | Admitting: Cardiovascular Disease

## 2017-09-16 DIAGNOSIS — E872 Acidosis: Secondary | ICD-10-CM | POA: Diagnosis not present

## 2017-09-16 DIAGNOSIS — E1129 Type 2 diabetes mellitus with other diabetic kidney complication: Secondary | ICD-10-CM | POA: Diagnosis not present

## 2017-09-16 DIAGNOSIS — R809 Proteinuria, unspecified: Secondary | ICD-10-CM | POA: Diagnosis not present

## 2017-09-16 DIAGNOSIS — E559 Vitamin D deficiency, unspecified: Secondary | ICD-10-CM | POA: Diagnosis not present

## 2017-09-16 DIAGNOSIS — N189 Chronic kidney disease, unspecified: Secondary | ICD-10-CM | POA: Diagnosis not present

## 2017-09-16 DIAGNOSIS — N183 Chronic kidney disease, stage 3 (moderate): Secondary | ICD-10-CM | POA: Diagnosis not present

## 2017-09-16 DIAGNOSIS — N200 Calculus of kidney: Secondary | ICD-10-CM | POA: Diagnosis not present

## 2017-09-16 DIAGNOSIS — I10 Essential (primary) hypertension: Secondary | ICD-10-CM | POA: Diagnosis not present

## 2017-10-09 DIAGNOSIS — H2513 Age-related nuclear cataract, bilateral: Secondary | ICD-10-CM | POA: Diagnosis not present

## 2017-10-09 DIAGNOSIS — H10413 Chronic giant papillary conjunctivitis, bilateral: Secondary | ICD-10-CM | POA: Diagnosis not present

## 2017-10-09 DIAGNOSIS — H40013 Open angle with borderline findings, low risk, bilateral: Secondary | ICD-10-CM | POA: Diagnosis not present

## 2017-10-09 DIAGNOSIS — H401112 Primary open-angle glaucoma, right eye, moderate stage: Secondary | ICD-10-CM | POA: Diagnosis not present

## 2017-10-09 DIAGNOSIS — E119 Type 2 diabetes mellitus without complications: Secondary | ICD-10-CM | POA: Diagnosis not present

## 2017-10-13 ENCOUNTER — Encounter (HOSPITAL_COMMUNITY): Payer: Self-pay | Admitting: Emergency Medicine

## 2017-10-13 ENCOUNTER — Other Ambulatory Visit: Payer: Self-pay

## 2017-10-13 ENCOUNTER — Emergency Department (HOSPITAL_COMMUNITY)
Admission: EM | Admit: 2017-10-13 | Discharge: 2017-10-13 | Disposition: A | Discharge status: Home / Self Care | Payer: PPO | Attending: Emergency Medicine | Admitting: Emergency Medicine

## 2017-10-13 ENCOUNTER — Emergency Department (HOSPITAL_COMMUNITY): Payer: PPO

## 2017-10-13 DIAGNOSIS — Y9389 Activity, other specified: Secondary | ICD-10-CM | POA: Insufficient documentation

## 2017-10-13 DIAGNOSIS — S61214A Laceration without foreign body of right ring finger without damage to nail, initial encounter: Secondary | ICD-10-CM | POA: Insufficient documentation

## 2017-10-13 DIAGNOSIS — S6991XA Unspecified injury of right wrist, hand and finger(s), initial encounter: Secondary | ICD-10-CM | POA: Diagnosis present

## 2017-10-13 DIAGNOSIS — Y929 Unspecified place or not applicable: Secondary | ICD-10-CM | POA: Insufficient documentation

## 2017-10-13 DIAGNOSIS — I2510 Atherosclerotic heart disease of native coronary artery without angina pectoris: Secondary | ICD-10-CM | POA: Insufficient documentation

## 2017-10-13 DIAGNOSIS — W228XXA Striking against or struck by other objects, initial encounter: Secondary | ICD-10-CM | POA: Diagnosis not present

## 2017-10-13 DIAGNOSIS — Z79899 Other long term (current) drug therapy: Secondary | ICD-10-CM | POA: Insufficient documentation

## 2017-10-13 DIAGNOSIS — Z7982 Long term (current) use of aspirin: Secondary | ICD-10-CM | POA: Diagnosis not present

## 2017-10-13 DIAGNOSIS — Z87891 Personal history of nicotine dependence: Secondary | ICD-10-CM | POA: Insufficient documentation

## 2017-10-13 DIAGNOSIS — I10 Essential (primary) hypertension: Secondary | ICD-10-CM | POA: Diagnosis not present

## 2017-10-13 DIAGNOSIS — Y998 Other external cause status: Secondary | ICD-10-CM | POA: Insufficient documentation

## 2017-10-13 MED ORDER — LIDOCAINE HCL (PF) 2 % IJ SOLN
5.0000 mL | Freq: Once | INTRAMUSCULAR | Status: AC
  Administered 2017-10-13: 5 mL via INTRADERMAL

## 2017-10-13 MED ORDER — TETANUS-DIPHTH-ACELL PERTUSSIS 5-2.5-18.5 LF-MCG/0.5 IM SUSP: 0.5000 mL | Freq: Once | INTRAMUSCULAR | Status: DC

## 2017-10-13 NOTE — ED Provider Notes (Signed)
[facility] EMERGENCY DEPARTMENT Provider Note   CSN: [id number] Arrival date & time: 10/13/17  1341     History   Chief Complaint Chief Complaint  Patient presents with  . Laceration    HPI Craig Taylor is a 77 y.o. male.  HPI   Craig Taylor is a 77 y.o. male who presents to the Emergency Department complaining of crush injury to the right ring finger.  He states that he was picking up a large log with a log rolled over onto his finger.  He was wearing leather gloves at the time.  He complains of laceration to the distal end of his finger.  Wound has been cleaned with tap water and bandage applied prior to arrival.  Patient denies injury to the nail, numbness, or pain proximal to the distal finger.  Last tetanus is unknown.  He has not taken any medications prior to arrival.  Patient states he does take one 81 mg aspirin daily.   Past Medical History:  Diagnosis Date  . Coronary artery disease   . Hyperlipidemia   . Hypertension   . Non-insulin dependent type 2 diabetes mellitus [facility])     Patient Active Problem List   Diagnosis Date Noted  . Urinary tract infectious disease 06/15/2015  . UTI (lower urinary tract infection) 06/15/2015  . Essential hypertension 08/08/2013  . Hyperlipidemia 08/08/2013  . Type 2 diabetes mellitus ([place]) 08/08/2013  . Coronary artery disease 08/08/2013  . Pain in joint, lower leg 11/11/2011  . Stiffness of joint, not elsewhere classified, lower leg 11/11/2011    Past Surgical History:  Procedure Laterality Date  . CARDIAC CATHETERIZATION  08/03/2002   No intervention  . CARDIAC CATHETERIZATION  08/05/2002   Crux of RCA 95% stenosis, stented with a 2.5x60mm CYPHER stent resulting in reduction of 95% stenosis to 0% residual; overlapping CYPHER stents -3x62mm and 3x71mm- deployed at 15atm resulting in reduction of 70% stenosis to 0% residual.  . CARDIAC CATHETERIZATION  10/04/2002   No intervention  . CARDIOVASCULAR STRESS TEST   09/07/2009   Perfusion defect seen in inferior region consistent with diaphragmatic attenuation. Remaining myocardium demonstrates normal myocardial perfusion with no evidence of ischemia or infarct  . CAROTID DOPPLER  09/07/2009   No evidence of bilateral diameter reduction, significant tortuosity, or any other vascular abnormality  . Colonoscopy    . COLONOSCOPY N/A 05/31/2014   Procedure: COLONOSCOPY;  Surgeon: Daneil Dolin, MD;  Location: [facility] ENDO SUITE;  Service: Endoscopy;  Laterality: N/A;  7:30 Am  . TRANSTHORACIC ECHOCARDIOGRAM  03/30/2006   EF 50-55%, LA moderate-severely dilated,      Home Medications    Prior to Admission medications   Medication Sig Start Date End Date Taking? Authorizing Provider  aspirin EC 81 MG tablet Take 81 mg by mouth daily.    [provider]  Cinnamon 500 MG capsule Take 500 mg by mouth daily.    [provider]  dutasteride (AVODART) 0.5 MG capsule Take 0.5 mg by mouth daily.    [provider]  enalapril (VASOTEC) 2.5 MG tablet Take 2.5 mg by mouth daily.    [provider]  fenofibrate micronized (LOFIBRA) 134 MG capsule Take 1 capsule (134 mg total) by mouth daily before breakfast. 11/22/13   Lorretta Harp, MD  JANUVIA 100 MG tablet  02/28/17   [provider]  metFORMIN (GLUCOPHAGE) 500 MG tablet Take 500 mg by mouth 2 (two) times daily with a meal.  [provider]  metoprolol tartrate (LOPRESSOR) 50 MG tablet TAKE 1 TABLET BY MOUTH TWICE DAILY 08/21/17   Herminio Commons, MD  nitroGLYCERIN (NITROSTAT) 0.4 MG SL tablet Place 1 tablet (0.4 mg total) under the tongue every 5 (five) minutes as needed for chest pain. 01/15/15   Herminio Commons, MD  Omega-3 Fatty Acids (FISH OIL PO) Take by mouth.    [provider]  simvastatin (ZOCOR) 20 MG tablet Take 1 tablet (20 mg total) by mouth daily. 01/09/17   Herminio Commons, MD    Family History Family History  Problem Relation  Age of Onset  . Stroke Mother   . Colon cancer Neg Hx     Social History Social History   Tobacco Use  . Smoking status: Former Smoker    Packs/day: 2.00    Years: 21.00    Pack years: 42.00    Types: Cigarettes    Start date: 07/04/1957    Last attempt to quit: 08/09/1983    Years since quitting: 34.2  . Smokeless tobacco: Former Systems developer    Quit date: 08/11/2012  . Tobacco comment: quit chewing tobacco aug 2014  Substance Use Topics  . Alcohol use: No    Alcohol/week: 0.0 oz  . Drug use: No     Allergies   Lipitor [atorvastatin]; Penicillins; and Voltaren [diclofenac sodium]   Review of Systems Review of Systems  Constitutional: Negative for chills and fever.  Musculoskeletal: Negative for arthralgias and joint swelling.  Skin: Positive for wound.       Laceration right ring finger  Neurological: Negative for dizziness, weakness and numbness.  Hematological: Does not bruise/bleed easily.  All other systems reviewed and are negative.    Physical Exam Updated Vital Signs There were no vitals taken for this visit.  Physical Exam  Constitutional: He is oriented to person, place, and time. He appears well-developed and well-nourished. No distress.  HENT:  Head: Normocephalic and atraumatic.  Cardiovascular: Normal rate, regular rhythm and intact distal pulses.  No murmur heard. Pulmonary/Chest: Effort normal and breath sounds normal. No respiratory distress.  Musculoskeletal: He exhibits tenderness. He exhibits no edema.       Right hand: He exhibits laceration. He exhibits normal range of motion, no bony tenderness, normal capillary refill and no swelling. Normal sensation noted. Normal strength noted.       Hands: 2 lacerations to the distal end of the right ring finger.  Bleeding controlled.  No edema.  No bony tenderness to palpation.  Nail is intact and without discoloration  Neurological: He is alert and oriented to person, place, and time. He exhibits normal  muscle tone. Coordination normal.  Skin: Skin is warm. Capillary refill takes less than 2 seconds.  Nursing note and vitals reviewed.    ED Treatments / Results  Labs (all labs ordered are listed, but only abnormal results are displayed) Labs Reviewed - No data to display  EKG  EKG Interpretation None       Radiology Dg Finger Ring Right  Result Date: 10/13/2017 CLINICAL DATA:  77 y/o  M; laceration to distal ring finger. EXAM: RIGHT RING FINGER 2+V COMPARISON:  09/19/2013 right hand radiographs FINDINGS: There is no evidence of fracture or dislocation. There is no evidence of arthropathy or other focal bone abnormality. Soft tissue defect of distal fourth finger. IMPRESSION: Soft tissue defect of distal fourth finger. No acute fracture or dislocation identified. Electronically Signed   By: Edgardo Roys.D.  On: 10/13/2017 16:44    Procedures Procedures (including critical care time)  LACERATION REPAIR #1 Performed by: Norvel Wenker L. Authorized by: Hale Bogus Consent: Verbal consent obtained. Risks and benefits: risks, benefits and alternatives were discussed Consent given by: patient Patient identity confirmed: provided demographic data Prepped and Draped in normal sterile fashion Wound explored  Laceration Location: right ring finger  Laceration Length: 2 cm  No Foreign Bodies seen or palpated  Anesthesia:  digital block Local anesthetic: lidocaine 2 % w/o epinephrine  Anesthetic total: 3 ml  Irrigation method: syringe Amount of cleaning: standard  Skin closure: 4-0 ethilon  Number of sutures: 5  Technique: simple interrupted  Patient tolerance: Patient tolerated the procedure well with no immediate complications.   LACERATION REPAIR #2 Performed by: Jennifr Gaeta L. Authorized by: Hale Bogus Consent: Verbal consent obtained. Risks and benefits: risks, benefits and alternatives were discussed Consent given by:  patient Patient identity confirmed: provided demographic data Prepped and Draped in normal sterile fashion Wound explored  Laceration Location: distal right ring finger  Laceration Length: 2 cm  No Foreign Bodies seen or palpated  Anesthesia: digital block (only performed once for both lacerations) Local anesthetic: lidocaine 2% w/o epinephrine  Anesthetic total: 3  ml  Irrigation method: syringe Amount of cleaning: standard  Skin closure: 4-0 ethilon  Number of sutures: 4  Technique: simple interrupted  Patient tolerance: Patient tolerated the procedure well with no immediate complications.  Medications Ordered in ED Medications  lidocaine (XYLOCAINE) 2 % injection 5 mL (5 mLs Intradermal Given 10/13/17 1832)     Initial Impression / Assessment and Plan / ED Course  I have reviewed the triage vital signs and the nursing notes.  Pertinent labs & imaging results that were available during my care of the patient were reviewed by me and considered in my medical decision making (see chart for details).     Td updated, XR neg for fx.   NV intact Finger bandaged and splinted, no indication for antibiotics.  Wound care instructions discussed, sutures out in 10 days, patient agrees to ER return for any signs of infection.     Final Clinical Impressions(s) / ED Diagnoses   Final diagnoses:  Laceration of right ring finger without foreign body without damage to nail, initial encounter    ED Discharge Orders    None       Kem Parkinson, PA-C 10/15/17 1235    Milton Ferguson, MD 10/15/17 1545

## 2017-10-13 NOTE — ED Triage Notes (Signed)
Pt dropped log on right ring finger. Bleeding controlled. Open laceration to the side.

## 2017-10-13 NOTE — Discharge Instructions (Signed)
Elevate your hand as much as possible.  Cool water soaks or ice pack 5 min on/ 10-15 min off as needed for swelling.  Tylenol every 4 hrs.  Clean with mild soap and water.  Sutures out in 10 days.  Return here for any signs of infection.

## 2017-10-14 ENCOUNTER — Other Ambulatory Visit: Payer: Self-pay

## 2017-10-14 NOTE — Patient Outreach (Signed)
Spoke with patient regarding recent ED visit. Patient stated he smashed finger and had to get 9 stiches. Patient was concerned he was told he needed a TDap, but did not receive it in ED. CMA advised patient to follow up with PCP for both suture removal and vaccination if needed. Patient was interested in [facility] services, but was at work. Will mail successful letter, know before you go, and magnet today.   [place] Management Assistant

## 2017-10-22 DIAGNOSIS — Z79899 Other long term (current) drug therapy: Secondary | ICD-10-CM | POA: Diagnosis not present

## 2017-10-22 DIAGNOSIS — E559 Vitamin D deficiency, unspecified: Secondary | ICD-10-CM | POA: Diagnosis not present

## 2017-10-22 DIAGNOSIS — N183 Chronic kidney disease, stage 3 (moderate): Secondary | ICD-10-CM | POA: Diagnosis not present

## 2017-10-22 DIAGNOSIS — R809 Proteinuria, unspecified: Secondary | ICD-10-CM | POA: Diagnosis not present

## 2017-10-22 DIAGNOSIS — Z1159 Encounter for screening for other viral diseases: Secondary | ICD-10-CM | POA: Diagnosis not present

## 2017-10-22 DIAGNOSIS — I10 Essential (primary) hypertension: Secondary | ICD-10-CM | POA: Diagnosis not present

## 2017-10-23 DIAGNOSIS — E6609 Other obesity due to excess calories: Secondary | ICD-10-CM | POA: Diagnosis not present

## 2017-10-23 DIAGNOSIS — E119 Type 2 diabetes mellitus without complications: Secondary | ICD-10-CM | POA: Diagnosis not present

## 2017-10-23 DIAGNOSIS — E1165 Type 2 diabetes mellitus with hyperglycemia: Secondary | ICD-10-CM | POA: Diagnosis not present

## 2017-10-23 DIAGNOSIS — Z23 Encounter for immunization: Secondary | ICD-10-CM | POA: Diagnosis not present

## 2017-10-23 DIAGNOSIS — S61210A Laceration without foreign body of right index finger without damage to nail, initial encounter: Secondary | ICD-10-CM | POA: Diagnosis not present

## 2017-10-23 DIAGNOSIS — Z6833 Body mass index (BMI) 33.0-33.9, adult: Secondary | ICD-10-CM | POA: Diagnosis not present

## 2017-10-26 ENCOUNTER — Other Ambulatory Visit: Payer: Self-pay | Admitting: Nephrology

## 2017-10-26 DIAGNOSIS — N183 Chronic kidney disease, stage 3 unspecified: Secondary | ICD-10-CM

## 2017-10-29 ENCOUNTER — Ambulatory Visit (HOSPITAL_COMMUNITY)
Admission: RE | Admit: 2017-10-29 | Discharge: 2017-10-29 | Disposition: A | Discharge status: Home / Self Care | Payer: PPO | Source: Ambulatory Visit | Attending: Nephrology | Admitting: Nephrology

## 2017-10-29 DIAGNOSIS — N281 Cyst of kidney, acquired: Secondary | ICD-10-CM | POA: Insufficient documentation

## 2017-10-29 DIAGNOSIS — N200 Calculus of kidney: Secondary | ICD-10-CM | POA: Diagnosis not present

## 2017-10-29 DIAGNOSIS — R809 Proteinuria, unspecified: Secondary | ICD-10-CM | POA: Diagnosis not present

## 2017-10-29 DIAGNOSIS — E559 Vitamin D deficiency, unspecified: Secondary | ICD-10-CM | POA: Diagnosis not present

## 2017-10-29 DIAGNOSIS — N183 Chronic kidney disease, stage 3 unspecified: Secondary | ICD-10-CM

## 2017-10-29 DIAGNOSIS — N189 Chronic kidney disease, unspecified: Secondary | ICD-10-CM | POA: Diagnosis not present

## 2017-10-29 DIAGNOSIS — I10 Essential (primary) hypertension: Secondary | ICD-10-CM | POA: Diagnosis not present

## 2017-10-29 DIAGNOSIS — E872 Acidosis: Secondary | ICD-10-CM | POA: Diagnosis not present

## 2017-10-29 DIAGNOSIS — E1129 Type 2 diabetes mellitus with other diabetic kidney complication: Secondary | ICD-10-CM | POA: Diagnosis not present

## 2017-12-04 DIAGNOSIS — L821 Other seborrheic keratosis: Secondary | ICD-10-CM | POA: Diagnosis not present

## 2017-12-04 DIAGNOSIS — L570 Actinic keratosis: Secondary | ICD-10-CM | POA: Diagnosis not present

## 2017-12-04 DIAGNOSIS — D485 Neoplasm of uncertain behavior of skin: Secondary | ICD-10-CM | POA: Diagnosis not present

## 2017-12-04 DIAGNOSIS — Z08 Encounter for follow-up examination after completed treatment for malignant neoplasm: Secondary | ICD-10-CM | POA: Diagnosis not present

## 2017-12-04 DIAGNOSIS — X32XXXA Exposure to sunlight, initial encounter: Secondary | ICD-10-CM | POA: Diagnosis not present

## 2017-12-04 DIAGNOSIS — Z85828 Personal history of other malignant neoplasm of skin: Secondary | ICD-10-CM | POA: Diagnosis not present

## 2017-12-04 DIAGNOSIS — C44311 Basal cell carcinoma of skin of nose: Secondary | ICD-10-CM | POA: Diagnosis not present

## 2018-01-14 DIAGNOSIS — C44321 Squamous cell carcinoma of skin of nose: Secondary | ICD-10-CM | POA: Diagnosis not present

## 2018-01-14 DIAGNOSIS — Z85828 Personal history of other malignant neoplasm of skin: Secondary | ICD-10-CM | POA: Diagnosis not present

## 2018-01-21 DIAGNOSIS — E6609 Other obesity due to excess calories: Secondary | ICD-10-CM | POA: Diagnosis not present

## 2018-01-21 DIAGNOSIS — Z6832 Body mass index (BMI) 32.0-32.9, adult: Secondary | ICD-10-CM | POA: Diagnosis not present

## 2018-01-21 DIAGNOSIS — E7849 Other hyperlipidemia: Secondary | ICD-10-CM | POA: Diagnosis not present

## 2018-01-21 DIAGNOSIS — Z1389 Encounter for screening for other disorder: Secondary | ICD-10-CM | POA: Diagnosis not present

## 2018-01-21 DIAGNOSIS — I10 Essential (primary) hypertension: Secondary | ICD-10-CM | POA: Diagnosis not present

## 2018-01-21 DIAGNOSIS — I2510 Atherosclerotic heart disease of native coronary artery without angina pectoris: Secondary | ICD-10-CM | POA: Diagnosis not present

## 2018-01-21 DIAGNOSIS — Z0001 Encounter for general adult medical examination with abnormal findings: Secondary | ICD-10-CM | POA: Diagnosis not present

## 2018-01-21 DIAGNOSIS — E119 Type 2 diabetes mellitus without complications: Secondary | ICD-10-CM | POA: Diagnosis not present

## 2018-01-29 DIAGNOSIS — Z0000 Encounter for general adult medical examination without abnormal findings: Secondary | ICD-10-CM | POA: Diagnosis not present

## 2018-01-29 DIAGNOSIS — Z79899 Other long term (current) drug therapy: Secondary | ICD-10-CM | POA: Diagnosis not present

## 2018-01-29 DIAGNOSIS — E6609 Other obesity due to excess calories: Secondary | ICD-10-CM | POA: Diagnosis not present

## 2018-01-29 DIAGNOSIS — N183 Chronic kidney disease, stage 3 (moderate): Secondary | ICD-10-CM | POA: Diagnosis not present

## 2018-01-29 DIAGNOSIS — Z1389 Encounter for screening for other disorder: Secondary | ICD-10-CM | POA: Diagnosis not present

## 2018-01-29 DIAGNOSIS — R809 Proteinuria, unspecified: Secondary | ICD-10-CM | POA: Diagnosis not present

## 2018-01-29 DIAGNOSIS — D649 Anemia, unspecified: Secondary | ICD-10-CM | POA: Diagnosis not present

## 2018-01-29 DIAGNOSIS — I10 Essential (primary) hypertension: Secondary | ICD-10-CM | POA: Diagnosis not present

## 2018-01-29 DIAGNOSIS — E559 Vitamin D deficiency, unspecified: Secondary | ICD-10-CM | POA: Diagnosis not present

## 2018-01-29 DIAGNOSIS — Z6832 Body mass index (BMI) 32.0-32.9, adult: Secondary | ICD-10-CM | POA: Diagnosis not present

## 2018-01-29 DIAGNOSIS — D509 Iron deficiency anemia, unspecified: Secondary | ICD-10-CM | POA: Diagnosis not present

## 2018-02-03 DIAGNOSIS — N183 Chronic kidney disease, stage 3 (moderate): Secondary | ICD-10-CM | POA: Diagnosis not present

## 2018-02-03 DIAGNOSIS — D509 Iron deficiency anemia, unspecified: Secondary | ICD-10-CM | POA: Diagnosis not present

## 2018-02-03 DIAGNOSIS — E872 Acidosis: Secondary | ICD-10-CM | POA: Diagnosis not present

## 2018-02-03 DIAGNOSIS — E559 Vitamin D deficiency, unspecified: Secondary | ICD-10-CM | POA: Diagnosis not present

## 2018-02-08 DIAGNOSIS — D510 Vitamin B12 deficiency anemia due to intrinsic factor deficiency: Secondary | ICD-10-CM | POA: Diagnosis not present

## 2018-02-25 ENCOUNTER — Encounter: Payer: Self-pay | Admitting: Internal Medicine

## 2018-03-01 ENCOUNTER — Other Ambulatory Visit: Payer: Self-pay | Admitting: Cardiovascular Disease

## 2018-04-05 ENCOUNTER — Ambulatory Visit: Payer: PPO | Admitting: Cardiovascular Disease

## 2018-04-08 ENCOUNTER — Encounter: Payer: Self-pay | Admitting: *Deleted

## 2018-04-08 ENCOUNTER — Encounter: Payer: Self-pay | Admitting: Cardiovascular Disease

## 2018-04-08 ENCOUNTER — Other Ambulatory Visit: Payer: Self-pay

## 2018-04-08 ENCOUNTER — Ambulatory Visit (INDEPENDENT_AMBULATORY_CARE_PROVIDER_SITE_OTHER): Payer: PPO | Admitting: Cardiovascular Disease

## 2018-04-08 VITALS — BP 139/73 | HR 59 | Ht 77.0 in | Wt 257.0 lb

## 2018-04-08 DIAGNOSIS — E785 Hyperlipidemia, unspecified: Secondary | ICD-10-CM | POA: Diagnosis not present

## 2018-04-08 DIAGNOSIS — I10 Essential (primary) hypertension: Secondary | ICD-10-CM | POA: Diagnosis not present

## 2018-04-08 DIAGNOSIS — I25118 Atherosclerotic heart disease of native coronary artery with other forms of angina pectoris: Secondary | ICD-10-CM

## 2018-04-08 MED ORDER — NITROGLYCERIN 0.4 MG SL SUBL: 0.4000 mg | SUBLINGUAL_TABLET | SUBLINGUAL | 3 refills | Status: DC | PRN

## 2018-04-08 NOTE — Patient Instructions (Signed)

## 2018-04-08 NOTE — Progress Notes (Signed)
SUBJECTIVE: The patient resents her follow-up for coronary artery disease. He underwent RCA stenting in 2003 and also has hypertension, hyperlipidemia, and diabetes mellitus.  He has been doing fairly well overall.  He seldom has some pressure in the upper central part of his chest which lasts for a few seconds and resolves.  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  He has never had a take a nitroglycerin tablet since undergoing coronary artery stenting.  He drives a log truck 3 days/week.  He does not do any heavy lifting.   Soc Hx: He has 2 daughters, one in [place] and another in [place], who help to take care of him.   Review of Systems: As per "subjective", otherwise negative.  Allergies  Allergen Reactions  . Lipitor [Atorvastatin]     pruritis  . Penicillins   . Voltaren [Diclofenac Sodium]     Current Outpatient Medications  Medication Sig Dispense Refill  . aspirin EC 81 MG tablet Take 81 mg by mouth daily.    . Cinnamon 500 MG capsule Take 500 mg by mouth daily.    Marland Kitchen dutasteride (AVODART) 0.5 MG capsule Take 0.5 mg by mouth daily.    . enalapril (VASOTEC) 2.5 MG tablet Take 2.5 mg by mouth daily.    . fenofibrate micronized (LOFIBRA) 134 MG capsule Take 1 capsule (134 mg total) by mouth daily before breakfast. 90 capsule 2  . FERREX 150 150 MG capsule Take 150 mg by mouth 2 (two) times daily.  6  . JANUVIA 100 MG tablet     . metFORMIN (GLUCOPHAGE) 500 MG tablet Take 500 mg by mouth 2 (two) times daily with a meal.    . metoprolol tartrate (LOPRESSOR) 50 MG tablet TAKE 1 TABLET BY MOUTH TWICE DAILY 180 tablet 1  . nitroGLYCERIN (NITROSTAT) 0.4 MG SL tablet Place 1 tablet (0.4 mg total) under the tongue every 5 (five) minutes as needed for chest pain. 25 tablet 3  . Omega-3 Fatty Acids (FISH OIL PO) Take by mouth.    . simvastatin (ZOCOR) 20 MG tablet Take 1 tablet (20 mg total) by mouth daily. 90 tablet 1  . sodium  bicarbonate 650 MG tablet Take 650 mg by mouth 2 (two) times daily.  1   No current facility-administered medications for this visit.     Past Medical History:  Diagnosis Date  . Coronary artery disease   . Hyperlipidemia   . Hypertension   . Non-insulin dependent type 2 diabetes mellitus [facility])     Past Surgical History:  Procedure Laterality Date  . CARDIAC CATHETERIZATION  08/03/2002   No intervention  . CARDIAC CATHETERIZATION  08/05/2002   Crux of RCA 95% stenosis, stented with a 2.5x83mm CYPHER stent resulting in reduction of 95% stenosis to 0% residual; overlapping CYPHER stents -3x38mm and 3x39mm- deployed at 15atm resulting in reduction of 70% stenosis to 0% residual.  . CARDIAC CATHETERIZATION  10/04/2002   No intervention  . CARDIOVASCULAR STRESS TEST  09/07/2009   Perfusion defect seen in inferior region consistent with diaphragmatic attenuation. Remaining myocardium demonstrates normal myocardial perfusion with no evidence of ischemia or infarct  . CAROTID DOPPLER  09/07/2009   No evidence of bilateral diameter reduction, significant tortuosity, or any other vascular abnormality  . Colonoscopy    . COLONOSCOPY N/A 05/31/2014   Procedure: COLONOSCOPY;  Surgeon: Daneil Dolin, MD;  Location: [facility]O [facility];  Service: Endoscopy;  Laterality: N/A;  7:30 Am  . TRANSTHORACIC ECHOCARDIOGRAM  03/30/2006   EF 50-55%, LA moderate-severely dilated,     Social History   Socioeconomic History  . Marital status: Widowed    Spouse name: Not on file  . Number of children: Not on file  . Years of education: Not on file  . Highest education level: Not on file  Occupational History  . Not on file  Social Needs  . Financial resource strain: Not on file  . Food insecurity:    Worry: Not on file    Inability: Not on file  . Transportation needs:    Medical: Not on file    Non-medical: Not on file  Tobacco Use  . Smoking status: Former Smoker    Packs/day: 2.00    Years: 21.00      Pack years: 42.00    Types: Cigarettes    Start date: 07/04/1957    Last attempt to quit: 08/09/1983    Years since quitting: 34.6  . Smokeless tobacco: Former Systems developer    Quit date: 08/11/2012  . Tobacco comment: quit chewing tobacco aug 2014  Substance and Sexual Activity  . Alcohol use: No    Alcohol/week: 0.0 standard drinks  . Drug use: No  . Sexual activity: Never  Lifestyle  . Physical activity:    Days per week: Not on file    Minutes per session: Not on file  . Stress: Not on file  Relationships  . Social connections:    Talks on phone: Not on file    Gets together: Not on file    Attends religious service: Not on file    Active member of club or organization: Not on file    Attends meetings of clubs or organizations: Not on file    Relationship status: Not on file  . Intimate partner violence:    Fear of current or ex partner: Not on file    Emotionally abused: Not on file    Physically abused: Not on file    Forced sexual activity: Not on file  Other Topics Concern  . Not on file  Social History Narrative  . Not on file     Vitals:   04/08/18 1039  BP: 139/73  Pulse: (!) 59  SpO2: 97%  Weight: 257 lb (116.6 kg)  Height: 6\' 5"  (1.956 m)    Wt Readings from Last 3 Encounters:  04/08/18 257 lb (116.6 kg)  03/12/17 258 lb (117 kg)  03/13/16 265 lb (120.2 kg)     PHYSICAL EXAM General: NAD HEENT: Normal. Neck: No JVD, no thyromegaly. Lungs: Clear to auscultation bilaterally with normal respiratory effort. CV: Regular rate and rhythm, normal S1/S2, no S3/S4, no murmur. No pretibial or periankle edema.  No carotid bruit.   Abdomen: Soft, nontender, no distention.  Neurologic: Alert and oriented.  Psych: Normal affect. Skin: Normal. Musculoskeletal: No gross deformities.    ECG: Reviewed above under Subjective   Labs: Lab Results  Component Value Date/Time   K 3.7 06/16/2015 06:49 AM   BUN 16 06/16/2015 06:49 AM   CREATININE 1.32 (H)  06/16/2015 06:49 AM   ALT 16 (L) 06/16/2015 06:49 AM   HGB 10.8 (L) 06/16/2015 06:49 AM     Lipids: Lab Results  Component Value Date/Time   LDLCALC 81 03/07/2014 10:51 AM   CHOL 132 03/07/2014 10:51 AM   TRIG 129.0 03/07/2014 10:51 AM   HDL 25.20 (L) 03/07/2014 10:51 AM  ASSESSMENT AND PLAN:  1. CAD: Stable ischemic heart disease. Continue aspirin, metoprolol, and simvastatin.  I will refill nitroglycerin prescription.  He seldom has chest discomfort.  If he develops increasing frequency and intensity of chest discomfort, I would consider stress testing.  2. Essential HTN: Controlled on present therapy. No changes.  3. Hyperlipidemia: I will obtain copy of lipids from PCP.Continue simvastatin 20 mg.   Disposition: Follow up 6 months   Kate Sable, M.D., F.A.C.C.

## 2018-04-16 DIAGNOSIS — H401112 Primary open-angle glaucoma, right eye, moderate stage: Secondary | ICD-10-CM | POA: Diagnosis not present

## 2018-04-16 DIAGNOSIS — H10413 Chronic giant papillary conjunctivitis, bilateral: Secondary | ICD-10-CM | POA: Diagnosis not present

## 2018-04-16 DIAGNOSIS — H2513 Age-related nuclear cataract, bilateral: Secondary | ICD-10-CM | POA: Diagnosis not present

## 2018-04-16 DIAGNOSIS — H40013 Open angle with borderline findings, low risk, bilateral: Secondary | ICD-10-CM | POA: Diagnosis not present

## 2018-04-16 DIAGNOSIS — H3589 Other specified retinal disorders: Secondary | ICD-10-CM | POA: Diagnosis not present

## 2018-04-19 ENCOUNTER — Telehealth: Payer: Self-pay | Admitting: *Deleted

## 2018-04-19 MED ORDER — SIMVASTATIN 40 MG PO TABS: 40.0000 mg | ORAL_TABLET | Freq: Every day | ORAL | 3 refills | Status: DC

## 2018-04-19 NOTE — Telephone Encounter (Signed)
Per Dr. Bronson Ing review of labs done by pmd - Increase Simvastatin to 40mg  daily.    Patient notified and verbalized understanding on 04/16/2018.  New medication sent to [facility] now.

## 2018-04-20 ENCOUNTER — Encounter: Payer: Self-pay | Admitting: *Deleted

## 2018-05-04 ENCOUNTER — Encounter: Payer: Self-pay | Admitting: Gastroenterology

## 2018-05-06 ENCOUNTER — Telehealth: Payer: Self-pay | Admitting: Cardiovascular Disease

## 2018-05-06 ENCOUNTER — Encounter: Payer: Self-pay | Admitting: *Deleted

## 2018-05-06 DIAGNOSIS — I209 Angina pectoris, unspecified: Secondary | ICD-10-CM

## 2018-05-06 NOTE — Telephone Encounter (Signed)
Patient informed.  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

## 2018-05-06 NOTE — Telephone Encounter (Signed)
Herminio Commons, MD  Merlene Laughter, RN        Yes, hold Lopressor morning of.

## 2018-05-06 NOTE — Telephone Encounter (Signed)
Exercise stress test instructions reviewed with patient & instruction sheet given as well.  He is scheduled for 05/11/2018.    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  Patient is aware that provider will not be back in this office till 05/13/2018.

## 2018-05-06 NOTE — Telephone Encounter (Signed)
Pt is needing a Clearance letter from Dr. 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   It runs out on 05/12/18  It will need to be faxed to [facility]  Tele # [phone] Fax # [phone]

## 2018-05-06 NOTE — Telephone Encounter (Signed)
He will need a stress test.  Please schedule for exercise Myoview for angina pectoris.  He is okay to drive from my perspective but I would have him scheduled for a stress test in the near future.

## 2018-05-06 NOTE — Telephone Encounter (Signed)
Pre-cert Verification for the following procedure   Exercise Stress scheduled for 05-11-2018 at [facility]

## 2018-05-11 ENCOUNTER — Encounter (HOSPITAL_COMMUNITY): Payer: PPO

## 2018-05-11 ENCOUNTER — Encounter (HOSPITAL_COMMUNITY)
Admission: RE | Admit: 2018-05-11 | Discharge: 2018-05-11 | Disposition: A | Discharge status: Home / Self Care | Payer: PPO | Source: Ambulatory Visit | Attending: Cardiovascular Disease | Admitting: Cardiovascular Disease

## 2018-05-11 DIAGNOSIS — I209 Angina pectoris, unspecified: Secondary | ICD-10-CM | POA: Insufficient documentation

## 2018-05-11 LAB — NM MYOCAR MULTI W/SPECT W/WALL MOTION / EF
CHL CUP NUCLEAR SDS: 0
CSEPEDS: 12 s
CSEPHR: 73 %
CSEPPHR: 106 {beats}/min
Estimated workload: 4.6 METS
Exercise duration (min): 3 min
LVDIAVOL: 137 mL (ref 62–150)
LVSYSVOL: 62 mL
MPHR: 145 {beats}/min
RATE: 0.41
RPE: 12
Rest HR: 57 {beats}/min
SRS: 6
SSS: 6
TID: 1.22

## 2018-05-11 MED ORDER — TECHNETIUM TC 99M TETROFOSMIN IV KIT
10.0000 | PACK | Freq: Once | INTRAVENOUS | Status: AC | PRN
  Administered 2018-05-11: 10.4 via INTRAVENOUS

## 2018-05-11 MED ORDER — TECHNETIUM TC 99M TETROFOSMIN IV KIT
30.0000 | PACK | Freq: Once | INTRAVENOUS | Status: AC | PRN
  Administered 2018-05-11: 31 via INTRAVENOUS

## 2018-05-11 MED ORDER — REGADENOSON 0.4 MG/5ML IV SOLN
INTRAVENOUS | Status: AC
  Administered 2018-05-11: 0.4 mg via INTRAVENOUS
  Filled 2018-05-11: qty 5

## 2018-05-11 MED ORDER — SODIUM CHLORIDE 0.9% FLUSH
INTRAVENOUS | Status: AC
  Administered 2018-05-11: 10 mL via INTRAVENOUS
  Filled 2018-05-11: qty 10

## 2018-05-13 ENCOUNTER — Telehealth: Payer: Self-pay | Admitting: Cardiovascular Disease

## 2018-05-13 DIAGNOSIS — E6609 Other obesity due to excess calories: Secondary | ICD-10-CM | POA: Diagnosis not present

## 2018-05-13 DIAGNOSIS — E119 Type 2 diabetes mellitus without complications: Secondary | ICD-10-CM | POA: Diagnosis not present

## 2018-05-13 DIAGNOSIS — E7849 Other hyperlipidemia: Secondary | ICD-10-CM | POA: Diagnosis not present

## 2018-05-13 DIAGNOSIS — I10 Essential (primary) hypertension: Secondary | ICD-10-CM | POA: Diagnosis not present

## 2018-05-13 DIAGNOSIS — I2510 Atherosclerotic heart disease of native coronary artery without angina pectoris: Secondary | ICD-10-CM | POA: Diagnosis not present

## 2018-05-13 DIAGNOSIS — Z6832 Body mass index (BMI) 32.0-32.9, adult: Secondary | ICD-10-CM | POA: Diagnosis not present

## 2018-05-13 DIAGNOSIS — Z1389 Encounter for screening for other disorder: Secondary | ICD-10-CM | POA: Diagnosis not present

## 2018-05-13 NOTE — Telephone Encounter (Signed)
-----   Message from Herminio Commons, MD sent at 05/11/2018  1:02 PM EDT ----- Possibly a mild degree of scar tissue on the bottom wall of the heart but there were no significant blockages.  Pumping function of the heart was normal.  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

## 2018-05-13 NOTE — Telephone Encounter (Signed)
Patient informed and copy sent to PCP and [facility].

## 2018-05-13 NOTE — Telephone Encounter (Signed)
Patient called in regards to his recent stress test results.  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

## 2018-05-14 ENCOUNTER — Other Ambulatory Visit: Payer: Self-pay

## 2018-05-14 DIAGNOSIS — L821 Other seborrheic keratosis: Secondary | ICD-10-CM | POA: Diagnosis not present

## 2018-05-14 DIAGNOSIS — X32XXXA Exposure to sunlight, initial encounter: Secondary | ICD-10-CM | POA: Diagnosis not present

## 2018-05-14 DIAGNOSIS — Z08 Encounter for follow-up examination after completed treatment for malignant neoplasm: Secondary | ICD-10-CM | POA: Diagnosis not present

## 2018-05-14 DIAGNOSIS — L570 Actinic keratosis: Secondary | ICD-10-CM | POA: Diagnosis not present

## 2018-05-14 DIAGNOSIS — Z85828 Personal history of other malignant neoplasm of skin: Secondary | ICD-10-CM | POA: Diagnosis not present

## 2018-05-17 ENCOUNTER — Encounter: Payer: Self-pay | Admitting: *Deleted

## 2018-05-17 ENCOUNTER — Telehealth: Payer: Self-pay | Admitting: Cardiovascular Disease

## 2018-05-17 NOTE — Telephone Encounter (Signed)
Pt needs to have current letter on file signed by Dr. Bronson Ing and faxed to [phone].

## 2018-05-17 NOTE — Telephone Encounter (Signed)
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

## 2018-05-17 NOTE — Telephone Encounter (Signed)
Spoke with patient - informed him that letter has been done & faxed already.  Patient will check with urgent care to make sure they receive.

## 2018-05-21 ENCOUNTER — Ambulatory Visit: Payer: PPO | Admitting: Gastroenterology

## 2018-05-24 ENCOUNTER — Other Ambulatory Visit: Payer: Self-pay

## 2018-05-24 ENCOUNTER — Ambulatory Visit (INDEPENDENT_AMBULATORY_CARE_PROVIDER_SITE_OTHER): Payer: PPO | Admitting: Gastroenterology

## 2018-05-24 ENCOUNTER — Encounter: Payer: Self-pay | Admitting: Gastroenterology

## 2018-05-24 VITALS — BP 140/77 | HR 72 | Temp 97.0°F | Ht 77.0 in | Wt 260.4 lb

## 2018-05-24 DIAGNOSIS — D509 Iron deficiency anemia, unspecified: Secondary | ICD-10-CM | POA: Diagnosis not present

## 2018-05-24 DIAGNOSIS — E538 Deficiency of other specified B group vitamins: Secondary | ICD-10-CM

## 2018-05-24 DIAGNOSIS — K5900 Constipation, unspecified: Secondary | ICD-10-CM | POA: Insufficient documentation

## 2018-05-24 MED ORDER — PEG 3350-KCL-NA BICARB-NACL 420 G PO SOLR: 4000.0000 mL | ORAL | 0 refills | Status: DC

## 2018-05-24 MED ORDER — LUBIPROSTONE 24 MCG PO CAPS: 24.0000 ug | ORAL_CAPSULE | Freq: Two times a day (BID) | ORAL | 2 refills | Status: DC

## 2018-05-24 NOTE — Assessment & Plan Note (Signed)
Very pleasant 77 year old gentleman with history of diabetes mellitus, hypertension, stage III chronic kidney disease who presents with decline in hemoglobin this year associated with iron deficiency.  Also with B12 deficiency on labs back in June without supplementation.  No specific GI complaints.  Last colonoscopy 4 years ago, unremarkable.  New onset constipation in the setting of iron supplements.  We will plan on colonoscopy plus or minus upper endoscopy to evaluate iron deficiency anemia.  I have discussed the risks, alternatives, benefits with regards to but not limited to the risk of reaction to medication, bleeding, infection, perforation and the patient is agreeable to proceed. Written consent to be obtained.   Manage constipation with Amitiza 24 mcg 1-2 times daily, samples and prescription provided.  Encourage patient to manage constipation adequately prior to bowel preparation for colonoscopy.  He will call with any questions or concerns.  We will plan to hold iron 7 days before his procedure.

## 2018-05-24 NOTE — Progress Notes (Signed)
CC'D TO PCP °

## 2018-05-24 NOTE — Patient Instructions (Signed)
1. Start Amitiza 53mcg one to two times daily WITH FOOD for constipation. Samples provided. I sent RX to your pharmacy as well. 2. Please have your labs done within the next one week at [facility]. You do not have to fast. 3. Colonoscopy with possible upper endoscopy as scheduled. See separate instructions.

## 2018-05-24 NOTE — Progress Notes (Signed)
Primary Care Physician:  Redmond School, MD  Primary Gastroenterologist:  Garfield Cornea, MD Referring provider: Dr. Theador Hawthorne  Chief Complaint  Patient presents with  . IDA  . Constipation    since started Iron    HPI:  Craig Taylor is a 77 y.o. male with history of diabetes, chronic kidney disease stage III, hypertension, nephrolithiasis with microscopic hematuria who presents at the request of Dr. Theador Hawthorne for further evaluation of iron deficiency anemia.  Labs back in June with hemoglobin of 11.0, iron saturations 12%, TIBC 389 ferritin 16.  Hemoglobin had been 12.3 back in March.  PCP also did labs in June which showed a B12 of 173 low, folate 12.7.  Patient has been on iron for 3 months.  He is developed some constipation since on iron.  Stools are dark.  He may go 4 days without a BM.  No abdominal pain, bright red blood per rectum, heartburn, vomiting, dysphagia, weight loss.  Appetite is good.  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  Last colonoscopy in 2015, unremarkable.  Current Outpatient Medications  Medication Sig Dispense Refill  . aspirin EC 81 MG tablet Take 81 mg by mouth daily.    . Cinnamon 500 MG capsule Take 500 mg by mouth daily.    Marland Kitchen dutasteride (AVODART) 0.5 MG capsule Take 0.5 mg by mouth daily.    . enalapril (VASOTEC) 2.5 MG tablet Take 2.5 mg by mouth daily.    . fenofibrate micronized (LOFIBRA) 134 MG capsule Take 1 capsule (134 mg total) by mouth daily before breakfast. 90 capsule 2  . FERREX 150 150 MG capsule Take 150 mg by mouth 2 (two) times daily.  6  . JANUVIA 100 MG tablet Take 100 mg by mouth daily.     . metFORMIN (GLUCOPHAGE) 500 MG tablet Take 500 mg by mouth 2 (two) times daily with a meal.    . metoprolol tartrate (LOPRESSOR) 50 MG tablet TAKE 1 TABLET BY MOUTH TWICE DAILY 180 tablet 1  . nitroGLYCERIN (NITROSTAT) 0.4 MG SL tablet Place 1 tablet (0.4 mg total) under the tongue every 5 (five) minutes as needed for chest pain.  25 tablet 3  . Omega-3 Fatty Acids (FISH OIL PO) Take by mouth.    . simvastatin (ZOCOR) 40 MG tablet Take 1 tablet (40 mg total) by mouth daily. 90 tablet 3  . sodium bicarbonate 650 MG tablet Take 650 mg by mouth 2 (two) times daily.  1   No current facility-administered medications for this visit.     Allergies as of 05/24/2018 - Review Complete 05/24/2018  Allergen Reaction Noted  . Lipitor [atorvastatin]  12/08/2013  . Penicillins  08/03/2013  . Voltaren [diclofenac sodium]  08/03/2013    Past Medical History:  Diagnosis Date  . Chronic renal insufficiency   . Coronary artery disease   . Hyperlipidemia   . Hypertension   . Nephrolithiasis   . Non-insulin dependent type 2 diabetes mellitus [facility])     Past Surgical History:  Procedure Laterality Date  . CARDIAC CATHETERIZATION  08/03/2002   No intervention  . CARDIAC CATHETERIZATION  08/05/2002   Crux of RCA 95% stenosis, stented with a 2.5x9mm CYPHER stent resulting in reduction of 95% stenosis to 0% residual; overlapping CYPHER stents -3x66mm and 3x24mm- deployed at 15atm resulting in reduction of 70% stenosis to 0% residual.  . CARDIAC CATHETERIZATION  10/04/2002   No intervention  . CARDIOVASCULAR STRESS TEST  09/07/2009   Perfusion  defect seen in inferior region consistent with diaphragmatic attenuation. Remaining myocardium demonstrates normal myocardial perfusion with no evidence of ischemia or infarct  . CAROTID DOPPLER  09/07/2009   No evidence of bilateral diameter reduction, significant tortuosity, or any other vascular abnormality  . Colonoscopy    . COLONOSCOPY N/A 05/31/2014   Dr. Gala Romney: Normal  . TRANSTHORACIC ECHOCARDIOGRAM  03/30/2006   EF 50-55%, LA moderate-severely dilated,     Family History  Problem Relation Age of Onset  . Stroke Mother   . Colon cancer Neg Hx     Social History   Socioeconomic History  . Marital status: Widowed    Spouse name: Not on file  . Number of children: Not on file   . Years of education: Not on file  . Highest education level: Not on file  Occupational History  . Not on file  Social Needs  . Financial resource strain: Not on file  . Food insecurity:    Worry: Not on file    Inability: Not on file  . Transportation needs:    Medical: Not on file    Non-medical: Not on file  Tobacco Use  . Smoking status: Former Smoker    Packs/day: 2.00    Years: 21.00    Pack years: 42.00    Types: Cigarettes    Start date: 07/04/1957    Last attempt to quit: 08/09/1983    Years since quitting: 34.8  . Smokeless tobacco: Former Systems developer    Quit date: 08/11/2012  . Tobacco comment: quit chewing tobacco aug 2014  Substance and Sexual Activity  . Alcohol use: No    Alcohol/week: 0.0 standard drinks  . Drug use: No  . Sexual activity: Never  Lifestyle  . Physical activity:    Days per week: Not on file    Minutes per session: Not on file  . Stress: Not on file  Relationships  . Social connections:    Talks on phone: Not on file    Gets together: Not on file    Attends religious service: Not on file    Active member of club or organization: Not on file    Attends meetings of clubs or organizations: Not on file    Relationship status: Not on file  . Intimate partner violence:    Fear of current or ex partner: Not on file    Emotionally abused: Not on file    Physically abused: Not on file    Forced sexual activity: Not on file  Other Topics Concern  . Not on file  Social History Narrative  . Not on file      ROS:  General: Negative for anorexia, weight loss, fever, chills, fatigue, weakness. Eyes: Negative for vision changes.  ENT: Negative for hoarseness, difficulty swallowing , nasal congestion. CV: Negative for chest pain, angina, palpitations, dyspnea on exertion, peripheral edema.  Respiratory: Negative for dyspnea at rest, dyspnea on exertion, cough, sputum, wheezing.  GI: See history of present illness. GU:  Negative for dysuria,  hematuria, urinary incontinence, urinary frequency, nocturnal urination.  MS: Negative for joint pain, low back pain.  Derm: Negative for rash or itching.  Neuro: Negative for weakness, abnormal sensation, seizure, frequent headaches, memory loss, confusion.  Psych: Negative for anxiety, depression, suicidal ideation, hallucinations.  Endo: Negative for unusual weight change.  Heme: Negative for bruising or bleeding. Allergy: Negative for rash or hives.    Physical Examination:  BP 140/77   Pulse 72   Temp Marland Kitchen)  97 F (36.1 C) (Oral)   Ht 6\' 5"  (1.956 m)   Wt 260 lb 6.4 oz (118.1 kg)   BMI 30.88 kg/m    General: Well-nourished, well-developed in no acute distress.  Head: Normocephalic, atraumatic.   Eyes: Conjunctiva pink, no icterus. Mouth: Oropharyngeal mucosa moist and pink , no lesions erythema or exudate. Neck: Supple without thyromegaly, masses, or lymphadenopathy.  Lungs: Clear to auscultation bilaterally.  Heart: Regular rate and rhythm, no murmurs rubs or gallops.  Abdomen: Bowel sounds are normal, nontender, nondistended, no hepatosplenomegaly or masses, no abdominal bruits or    hernia , no rebound or guarding.   Rectal: Not performed Extremities: No lower extremity edema. No clubbing or deformities.  Neuro: Alert and oriented x 4 , grossly normal neurologically.  Skin: Warm and dry, no rash or jaundice.   Psych: Alert and cooperative, normal mood and affect.  Labs: See above  Imaging Studies: Nm Myocar Multi W/spect W/wall Motion / Ef  Result Date: 05/11/2018  Blood pressure demonstrated a hypertensive response to exercise.  There was no ST segment deviation noted during stress.  Defect 1: There is a medium defect of moderate severity present in the mid inferoseptal, mid inferior and apical inferior location. This is consistent with myocardial scar as well as overlying soft tissue attenuation. There are no significant ischemic territories.  This is a low risk  study.  Nuclear stress EF: 55%.

## 2018-05-24 NOTE — Assessment & Plan Note (Signed)
Recheck B12 level at this time.

## 2018-05-28 DIAGNOSIS — H40013 Open angle with borderline findings, low risk, bilateral: Secondary | ICD-10-CM | POA: Diagnosis not present

## 2018-05-28 DIAGNOSIS — H401112 Primary open-angle glaucoma, right eye, moderate stage: Secondary | ICD-10-CM | POA: Diagnosis not present

## 2018-05-28 DIAGNOSIS — H10413 Chronic giant papillary conjunctivitis, bilateral: Secondary | ICD-10-CM | POA: Diagnosis not present

## 2018-05-28 DIAGNOSIS — H2513 Age-related nuclear cataract, bilateral: Secondary | ICD-10-CM | POA: Diagnosis not present

## 2018-06-17 ENCOUNTER — Ambulatory Visit (INDEPENDENT_AMBULATORY_CARE_PROVIDER_SITE_OTHER): Payer: PPO | Admitting: Gastroenterology

## 2018-06-17 DIAGNOSIS — D509 Iron deficiency anemia, unspecified: Secondary | ICD-10-CM

## 2018-06-17 LAB — IFOBT (OCCULT BLOOD): IFOBT: POSITIVE

## 2018-06-18 ENCOUNTER — Encounter (HOSPITAL_COMMUNITY): Payer: Self-pay | Admitting: Emergency Medicine

## 2018-06-18 ENCOUNTER — Emergency Department (HOSPITAL_COMMUNITY)
Admission: EM | Admit: 2018-06-18 | Discharge: 2018-06-18 | Disposition: A | Discharge status: Home / Self Care | Payer: PPO | Attending: Emergency Medicine | Admitting: Emergency Medicine

## 2018-06-18 ENCOUNTER — Emergency Department (HOSPITAL_COMMUNITY): Payer: PPO

## 2018-06-18 ENCOUNTER — Other Ambulatory Visit: Payer: Self-pay

## 2018-06-18 DIAGNOSIS — R519 Headache, unspecified: Secondary | ICD-10-CM

## 2018-06-18 DIAGNOSIS — R112 Nausea with vomiting, unspecified: Secondary | ICD-10-CM | POA: Diagnosis not present

## 2018-06-18 DIAGNOSIS — R1110 Vomiting, unspecified: Secondary | ICD-10-CM | POA: Diagnosis not present

## 2018-06-18 DIAGNOSIS — R51 Headache: Secondary | ICD-10-CM | POA: Insufficient documentation

## 2018-06-18 DIAGNOSIS — N281 Cyst of kidney, acquired: Secondary | ICD-10-CM | POA: Diagnosis not present

## 2018-06-18 LAB — CBC
HEMATOCRIT: 40.4 % (ref 39.0–52.0)
HEMOGLOBIN: 12.6 g/dL — AB (ref 13.0–17.0)
MCH: 26.1 pg (ref 26.0–34.0)
MCHC: 31.2 g/dL (ref 30.0–36.0)
MCV: 83.8 fL (ref 80.0–100.0)
NRBC: 0 % (ref 0.0–0.2)
Platelets: 304 10*3/uL (ref 150–400)
RBC: 4.82 MIL/uL (ref 4.22–5.81)
RDW: 13.3 % (ref 11.5–15.5)
WBC: 10.7 10*3/uL — AB (ref 4.0–10.5)

## 2018-06-18 LAB — COMPREHENSIVE METABOLIC PANEL
ALT: 17 U/L (ref 0–44)
AST: 22 U/L (ref 15–41)
Albumin: 4.4 g/dL (ref 3.5–5.0)
Alkaline Phosphatase: 50 U/L (ref 38–126)
Anion gap: 9 (ref 5–15)
BUN: 19 mg/dL (ref 8–23)
CO2: 21 mmol/L — AB (ref 22–32)
Calcium: 9 mg/dL (ref 8.9–10.3)
Chloride: 105 mmol/L (ref 98–111)
Creatinine, Ser: 0.92 mg/dL (ref 0.61–1.24)
Glucose, Bld: 186 mg/dL — ABNORMAL HIGH (ref 70–99)
POTASSIUM: 3.9 mmol/L (ref 3.5–5.1)
SODIUM: 135 mmol/L (ref 135–145)
Total Bilirubin: 0.5 mg/dL (ref 0.3–1.2)
Total Protein: 7.8 g/dL (ref 6.5–8.1)

## 2018-06-18 LAB — URINALYSIS, ROUTINE W REFLEX MICROSCOPIC
BILIRUBIN URINE: NEGATIVE
Bacteria, UA: NONE SEEN
Glucose, UA: NEGATIVE mg/dL
Hgb urine dipstick: NEGATIVE
KETONES UR: NEGATIVE mg/dL
LEUKOCYTES UA: NEGATIVE
NITRITE: NEGATIVE
PROTEIN: 30 mg/dL — AB
Specific Gravity, Urine: 1.038 — ABNORMAL HIGH (ref 1.005–1.030)
pH: 6 (ref 5.0–8.0)

## 2018-06-18 LAB — LIPASE, BLOOD: LIPASE: 37 U/L (ref 11–51)

## 2018-06-18 MED ORDER — ONDANSETRON HCL 4 MG/2ML IJ SOLN
4.0000 mg | Freq: Once | INTRAMUSCULAR | Status: AC | PRN
  Administered 2018-06-18: 4 mg via INTRAVENOUS

## 2018-06-18 MED ORDER — SODIUM CHLORIDE 0.9 % IV BOLUS
1000.0000 mL | Freq: Once | INTRAVENOUS | Status: AC
  Administered 2018-06-18: 1000 mL via INTRAVENOUS

## 2018-06-18 MED ORDER — ONDANSETRON HCL 4 MG PO TABS: 4.0000 mg | ORAL_TABLET | Freq: Four times a day (QID) | ORAL | 0 refills | Status: DC | PRN

## 2018-06-18 MED ORDER — IOPAMIDOL (ISOVUE-300) INJECTION 61%
100.0000 mL | Freq: Once | INTRAVENOUS | Status: AC | PRN
  Administered 2018-06-18: 100 mL via INTRAVENOUS

## 2018-06-18 MED ORDER — KETOROLAC TROMETHAMINE 15 MG/ML IJ SOLN
15.0000 mg | Freq: Once | INTRAMUSCULAR | Status: AC
  Administered 2018-06-18: 15 mg via INTRAVENOUS
  Filled 2018-06-18: qty 1

## 2018-06-18 MED ORDER — ONDANSETRON HCL 4 MG/2ML IJ SOLN
INTRAMUSCULAR | Status: AC
  Filled 2018-06-18: qty 2

## 2018-06-18 MED ORDER — METOCLOPRAMIDE HCL 5 MG/ML IJ SOLN
10.0000 mg | Freq: Once | INTRAMUSCULAR | Status: AC
  Administered 2018-06-18: 10 mg via INTRAVENOUS
  Filled 2018-06-18: qty 2

## 2018-06-18 MED ORDER — ONDANSETRON HCL 4 MG/2ML IJ SOLN
4.0000 mg | Freq: Once | INTRAMUSCULAR | Status: AC | PRN
  Administered 2018-06-18: 4 mg via INTRAVENOUS
  Filled 2018-06-18: qty 2

## 2018-06-18 NOTE — Discharge Instructions (Signed)
You were evaluated in the emergency department for vomiting and a headache.  You had blood work and a CAT scan of your head and abdomen that did not show an obvious cause of your symptoms.  We are prescribing you some nausea medication to use at home.  Please continue to try to stay well-hydrated.  Follow-up with your doctor or return if any worsening symptoms.

## 2018-06-18 NOTE — ED Provider Notes (Signed)
[facility] EMERGENCY DEPARTMENT Provider Note   CSN: [id number] Arrival date & time: 06/18/18  0930     History   Chief Complaint Chief Complaint  Patient presents with  . Emesis    HPI Craig Taylor is a 77 y.o. male.  He presents today with vomiting that started around 130 this morning.  Said he is vomiting every 15 minutes yellow liquid.  Is not associated with any abdominal pain and no diarrhea.  No fevers or chills.  He said he reheated some church to do that he had on the weekend yesterday but it seemed fine.  He is also complaining of a headache.  He said he has had on and off headaches for about a year usually start in the occiput and then radiate up to his temples.  He said this headache is typical.  Usually takes ibuprofen for it.  No double vision blurry vision numbness weakness chest pain shortness of breath urinary symptoms rash or swollen joints.  The history is provided by the patient.  Emesis   This is a new problem. The current episode started 6 to 12 hours ago. The problem occurs more than 10 times per day. The problem has not changed since onset.The emesis has an appearance of stomach contents. There has been no fever. Associated symptoms include headaches. Pertinent negatives include no abdominal pain, no arthralgias, no chills, no cough, no diarrhea, no fever, no myalgias and no URI. Risk factors include suspect food intake.    Past Medical History:  Diagnosis Date  . Chronic renal insufficiency   . Coronary artery disease   . Hyperlipidemia   . Hypertension   . Nephrolithiasis   . Non-insulin dependent type 2 diabetes mellitus [facility])     Patient Active Problem List   Diagnosis Date Noted  . IDA (iron deficiency anemia) 05/24/2018  . B12 deficiency 05/24/2018  . Constipation 05/24/2018  . Urinary tract infectious disease 06/15/2015  . UTI (lower urinary tract infection) 06/15/2015  . Essential hypertension 08/08/2013  . Hyperlipidemia 08/08/2013  .  Type 2 diabetes mellitus ([place]) 08/08/2013  . Coronary artery disease 08/08/2013  . Pain in joint, lower leg 11/11/2011  . Stiffness of joint, not elsewhere classified, lower leg 11/11/2011    Past Surgical History:  Procedure Laterality Date  . CARDIAC CATHETERIZATION  08/03/2002   No intervention  . CARDIAC CATHETERIZATION  08/05/2002   Crux of RCA 95% stenosis, stented with a 2.5x17mm CYPHER stent resulting in reduction of 95% stenosis to 0% residual; overlapping CYPHER stents -3x45mm and 3x63mm- deployed at 15atm resulting in reduction of 70% stenosis to 0% residual.  . CARDIAC CATHETERIZATION  10/04/2002   No intervention  . CARDIOVASCULAR STRESS TEST  09/07/2009   Perfusion defect seen in inferior region consistent with diaphragmatic attenuation. Remaining myocardium demonstrates normal myocardial perfusion with no evidence of ischemia or infarct  . CAROTID DOPPLER  09/07/2009   No evidence of bilateral diameter reduction, significant tortuosity, or any other vascular abnormality  . Colonoscopy    . COLONOSCOPY N/A 05/31/2014   Dr. Gala Romney: Normal  . TRANSTHORACIC ECHOCARDIOGRAM  03/30/2006   EF 50-55%, LA moderate-severely dilated,         Home Medications    Prior to Admission medications   Medication Sig Start Date End Date Taking? Authorizing Provider  aspirin EC 81 MG tablet Take 81 mg by mouth daily.    [provider]  Cinnamon 500 MG capsule Take 500 mg by mouth daily.  [provider]  dutasteride (AVODART) 0.5 MG capsule Take 0.5 mg by mouth daily.    [provider]  enalapril (VASOTEC) 2.5 MG tablet Take 2.5 mg by mouth daily.    [provider]  fenofibrate micronized (LOFIBRA) 134 MG capsule Take 1 capsule (134 mg total) by mouth daily before breakfast. 11/22/13   Lorretta Harp, MD  FERREX 150 150 MG capsule Take 150 mg by mouth 2 (two) times daily. 02/04/18   [provider]  JANUVIA 100 MG tablet Take 100 mg by  mouth daily.  02/28/17   [provider]  lubiprostone (AMITIZA) 24 MCG capsule Take 1 capsule (24 mcg total) by mouth 2 (two) times daily with a meal. 05/24/18   Mahala Menghini, PA-C  metFORMIN (GLUCOPHAGE) 500 MG tablet Take 500 mg by mouth 2 (two) times daily with a meal.    [provider]  metoprolol tartrate (LOPRESSOR) 50 MG tablet TAKE 1 TABLET BY MOUTH TWICE DAILY 03/02/18   Herminio Commons, MD  nitroGLYCERIN (NITROSTAT) 0.4 MG SL tablet Place 1 tablet (0.4 mg total) under the tongue every 5 (five) minutes as needed for chest pain. 04/08/18   Herminio Commons, MD  Omega-3 Fatty Acids (FISH OIL PO) Take by mouth.    [provider]  polyethylene glycol-electrolytes (TRILYTE) 420 g solution Take 4,000 mLs by mouth as directed. 05/24/18   Rourk, Cristopher Estimable, MD  simvastatin (ZOCOR) 40 MG tablet Take 1 tablet (40 mg total) by mouth daily. 04/19/18   Herminio Commons, MD  sodium bicarbonate 650 MG tablet Take 650 mg by mouth 2 (two) times daily. 02/03/18   [provider]    Family History Family History  Problem Relation Age of Onset  . Stroke Mother   . Diabetes Mother   . Colon cancer Neg Hx     Social History Social History   Tobacco Use  . Smoking status: Former Smoker    Packs/day: 2.00    Years: 21.00    Pack years: 42.00    Types: Cigarettes    Start date: 07/04/1957    Last attempt to quit: 08/09/1983    Years since quitting: 34.8  . Smokeless tobacco: Former Systems developer    Quit date: 08/11/2012  . Tobacco comment: quit chewing tobacco aug 2014  Substance Use Topics  . Alcohol use: No    Alcohol/week: 0.0 standard drinks  . Drug use: No     Allergies   Lipitor [atorvastatin]; Penicillins; and Voltaren [diclofenac sodium]   Review of Systems Review of Systems  Constitutional: Negative for chills and fever.  HENT: Negative for sore throat.   Eyes: Negative for visual disturbance.  Respiratory: Negative for cough and  shortness of breath.   Cardiovascular: Negative for chest pain.  Gastrointestinal: Positive for nausea and vomiting. Negative for abdominal pain and diarrhea.  Genitourinary: Negative for dysuria.  Musculoskeletal: Negative for arthralgias and myalgias.  Skin: Negative for rash.  Neurological: Positive for headaches. Negative for seizures and speech difficulty.     Physical Exam Updated Vital Signs BP (!) 175/84 (BP Location: Right Arm)   Pulse 71   Temp 97.7 F (36.5 C) (Oral)   Resp 20   Ht 6\' 5"  (1.956 m)   Wt 117 kg   SpO2 94%   BMI 30.59 kg/m   Physical Exam  Constitutional: He is oriented to person, place, and time. He appears well-developed and well-nourished.  HENT:  Head: Normocephalic and atraumatic.  Eyes: Conjunctivae are normal.  Neck: Neck supple.  Cardiovascular: Normal rate, regular rhythm and normal heart sounds.  No murmur heard. Pulmonary/Chest: Effort normal and breath sounds normal. No respiratory distress.  Abdominal: Soft. There is no tenderness.  Musculoskeletal: He exhibits no edema or deformity.  Neurological: He is alert and oriented to person, place, and time. He has normal strength. No cranial nerve deficit or sensory deficit. GCS eye subscore is 4. GCS verbal subscore is 5. GCS motor subscore is 6.  Skin: Skin is warm and dry.  Psychiatric: He has a normal mood and affect.  Nursing note and vitals reviewed.    ED Treatments / Results  Labs (all labs ordered are listed, but only abnormal results are displayed) Labs Reviewed  COMPREHENSIVE METABOLIC PANEL - Abnormal; Notable for the following components:      Result Value   CO2 21 (*)    Glucose, Bld 186 (*)    All other components within normal limits  CBC - Abnormal; Notable for the following components:   WBC 10.7 (*)    Hemoglobin 12.6 (*)    All other components within normal limits  URINALYSIS, ROUTINE W REFLEX MICROSCOPIC - Abnormal; Notable for the following components:    Specific Gravity, Urine 1.038 (*)    Protein, ur 30 (*)    All other components within normal limits  LIPASE, BLOOD    EKG None  Radiology Ct Head Wo Contrast  Result Date: 06/18/2018 CLINICAL DATA:  Onset. Vomiting and headaches at 1:30 a.m. last night. EXAM: CT HEAD WITHOUT CONTRAST TECHNIQUE: Contiguous axial images were obtained from the base of the skull through the vertex without intravenous contrast. COMPARISON:  None. FINDINGS: Brain: No evidence of acute infarction, hemorrhage, hydrocephalus, extra-axial collection or mass lesion/mass effect. Patchy and confluent hypoattenuation in the subcortical and periventricular deep white matter consistent chronic microvascular ischemic change noted. There is mild atrophy. Vascular: No hyperdense vessel or unexpected calcification. Skull: Intact.  No focal lesion. Sinuses/Orbits: Negative. Other: None. IMPRESSION: No acute abnormality. Mild cortical atrophy and extensive chronic microvascular ischemic change. Electronically Signed   By: Inge Rise M.D.   On: 06/18/2018 13:15   Ct Abdomen Pelvis W Contrast  Result Date: 06/18/2018 CLINICAL DATA:  Vomiting and headache beginning early this morning. EXAM: CT ABDOMEN AND PELVIS WITH CONTRAST TECHNIQUE: Multidetector CT imaging of the abdomen and pelvis was performed using the standard protocol following bolus administration of intravenous contrast. CONTRAST:  194mL ISOVUE-300 IOPAMIDOL (ISOVUE-300) INJECTION 61% COMPARISON:  Ultrasound, 10/29/2017.  CT, 03/29/2012. FINDINGS: Lower chest: Mild dependent atelectasis. No acute findings. Mild cardiomegaly. Hepatobiliary: No focal liver abnormality is seen. No gallstones, gallbladder wall thickening, or biliary dilatation. Pancreas: Unremarkable. No pancreatic ductal dilatation or surrounding inflammatory changes. Spleen: Normal in size without focal abnormality. Adrenals/Urinary Tract: No adrenal masses. Kidneys normal in overall size and position.  Symmetric renal enhancement and excretion. Are tiny nonobstructing intrarenal stones. Mood is the mass is noted bilaterally consistent with cysts, largest from the lower pole the right kidney measuring 18 mm. Are renal sinus cysts on the left. No hydronephrosis. Ureters normal in course and in caliber. No ureteral stones. Bladder is unremarkable Stomach/Bowel: Small hiatal hernia. Stomach otherwise unremarkable small bowel is normal in caliber. No wall thickening or inflammation colon is normal in caliber with no wall thickening or inflammation. Appendix not visualized. No evidence of appendicitis. Vascular/Lymphatic: Aortic atherosclerosis. No aneurysm no adenopathy. Reproductive: Prostate enlarged measuring 5.6 x 4.1 x 6.1 cm. Other: No abdominal  wall hernia or abnormality. No abdominopelvic ascites. Musculoskeletal: There are vertebral endplate depressions, greatest at L3. This appears chronic, is new when compared to the prior CT. This could reflect a recent fracture there is back pain. No other evidence of a fracture. No osteoblastic or osteolytic lesions. IMPRESSION: 1. No acute findings within the abdomen or pelvis. No findings to account for the patient's symptoms. 2. L3 vertebral compression deformity, most likely chronic. 3. Aortic atherosclerosis. 4. Tiny intrarenal stones. No ureteral stone or obstructive uropathy. Bilateral renal cysts. Electronically Signed   By: Lajean Manes M.D.   On: 06/18/2018 13:18    Procedures Procedures (including critical care time)  Medications Ordered in ED Medications  sodium chloride 0.9 % bolus 1,000 mL (has no administration in time range)  ondansetron (ZOFRAN) injection 4 mg (4 mg Intravenous Given 06/18/18 1035)     Initial Impression / Assessment and Plan / ED Course  I have reviewed the triage vital signs and the nursing notes.  Pertinent labs & imaging results that were available during my care of the patient were reviewed by me and considered in my  medical decision making (see chart for details).  Clinical Course as of Jun 18 1729  Fri Jun 18, 2018  1343 Patient continues to feel nauseous and still has a headache.  His work-up has been fairly unremarkable.  We will try some Reglan and Toradol.  He lists diclofenac as an allergy but he says it truly ended up causing a GI bleed in the past.  He continues to take ibuprofen as needed.   [MB]  6967 Patient states his headache and nausea are moderately improved.  He is wondering if he just having a caffeine withdrawal headache at this point.  I do not see an overwhelming reason to admit the patient at this time.  We will p.o. trial.   [MB]  1526 Patient able to hold down a little p.o.  He would like to go home and I think that is reasonable at this time.  We will send him home with a prescription for some Zofran with clear instructions for return.   [MB]    Clinical Course User Index [MB] Hayden Rasmussen, MD     Final Clinical Impressions(s) / ED Diagnoses   Final diagnoses:  Non-intractable vomiting with nausea, unspecified vomiting type  Acute nonintractable headache, unspecified headache type    ED Discharge Orders         Ordered    ondansetron (ZOFRAN) 4 MG tablet  Every 6 hours PRN     06/18/18 1529           Hayden Rasmussen, MD 06/18/18 1730

## 2018-06-18 NOTE — ED Notes (Signed)
POs and crackers given

## 2018-06-18 NOTE — ED Notes (Addendum)
Pt complains of continued N and HA  Dr B is apprised

## 2018-06-18 NOTE — ED Notes (Signed)
Pt reports N is resolved  Awaiting CT

## 2018-06-18 NOTE — ED Notes (Signed)
To Rad 

## 2018-06-18 NOTE — ED Triage Notes (Signed)
Patient complaining of vomiting and headache starting at 0130 this morning.

## 2018-06-23 DIAGNOSIS — D509 Iron deficiency anemia, unspecified: Secondary | ICD-10-CM | POA: Diagnosis not present

## 2018-06-23 DIAGNOSIS — E538 Deficiency of other specified B group vitamins: Secondary | ICD-10-CM | POA: Diagnosis not present

## 2018-06-24 LAB — CBC WITH DIFFERENTIAL/PLATELET
BASOS ABS: 0 10*3/uL (ref 0.0–0.2)
Basos: 0 %
EOS (ABSOLUTE): 0.3 10*3/uL (ref 0.0–0.4)
Eos: 4 %
HEMOGLOBIN: 12.4 g/dL — AB (ref 13.0–17.7)
Hematocrit: 38.4 % (ref 37.5–51.0)
IMMATURE GRANS (ABS): 0 10*3/uL (ref 0.0–0.1)
Immature Granulocytes: 0 %
LYMPHS ABS: 1.6 10*3/uL (ref 0.7–3.1)
LYMPHS: 22 %
MCH: 26.6 pg (ref 26.6–33.0)
MCHC: 32.3 g/dL (ref 31.5–35.7)
MCV: 82 fL (ref 79–97)
MONOCYTES: 7 %
Monocytes Absolute: 0.5 10*3/uL (ref 0.1–0.9)
NEUTROS ABS: 5.1 10*3/uL (ref 1.4–7.0)
Neutrophils: 67 %
PLATELETS: 303 10*3/uL (ref 150–450)
RBC: 4.66 x10E6/uL (ref 4.14–5.80)
RDW: 13.7 % (ref 12.3–15.4)
WBC: 7.6 10*3/uL (ref 3.4–10.8)

## 2018-06-24 LAB — IRON,TIBC AND FERRITIN PANEL
Ferritin: 12 ng/mL — ABNORMAL LOW (ref 30–400)
Iron Saturation: 16 % (ref 15–55)
Iron: 57 ug/dL (ref 38–169)
TIBC: 350 ug/dL (ref 250–450)
UIBC: 293 ug/dL (ref 111–343)

## 2018-06-24 LAB — VITAMIN B12: VITAMIN B 12: 217 pg/mL — AB (ref 232–1245)

## 2018-06-30 DIAGNOSIS — H10413 Chronic giant papillary conjunctivitis, bilateral: Secondary | ICD-10-CM | POA: Diagnosis not present

## 2018-06-30 DIAGNOSIS — H16101 Unspecified superficial keratitis, right eye: Secondary | ICD-10-CM | POA: Diagnosis not present

## 2018-06-30 DIAGNOSIS — H01111 Allergic dermatitis of right upper eyelid: Secondary | ICD-10-CM | POA: Diagnosis not present

## 2018-06-30 DIAGNOSIS — H01112 Allergic dermatitis of right lower eyelid: Secondary | ICD-10-CM | POA: Diagnosis not present

## 2018-06-30 DIAGNOSIS — H01114 Allergic dermatitis of left upper eyelid: Secondary | ICD-10-CM | POA: Diagnosis not present

## 2018-06-30 DIAGNOSIS — H01115 Allergic dermatitis of left lower eyelid: Secondary | ICD-10-CM | POA: Diagnosis not present

## 2018-07-02 DIAGNOSIS — H10413 Chronic giant papillary conjunctivitis, bilateral: Secondary | ICD-10-CM | POA: Diagnosis not present

## 2018-07-02 DIAGNOSIS — H40012 Open angle with borderline findings, low risk, left eye: Secondary | ICD-10-CM | POA: Diagnosis not present

## 2018-07-02 DIAGNOSIS — H2513 Age-related nuclear cataract, bilateral: Secondary | ICD-10-CM | POA: Diagnosis not present

## 2018-07-02 DIAGNOSIS — H401112 Primary open-angle glaucoma, right eye, moderate stage: Secondary | ICD-10-CM | POA: Diagnosis not present

## 2018-07-13 ENCOUNTER — Other Ambulatory Visit: Payer: Self-pay

## 2018-07-13 ENCOUNTER — Encounter (HOSPITAL_COMMUNITY)
Admission: RE | Disposition: A | Discharge status: Home / Self Care | Payer: Self-pay | Source: Ambulatory Visit | Attending: Internal Medicine

## 2018-07-13 ENCOUNTER — Ambulatory Visit (HOSPITAL_COMMUNITY)
Admission: RE | Admit: 2018-07-13 | Discharge: 2018-07-13 | Disposition: A | Discharge status: Home / Self Care | Payer: PPO | Source: Ambulatory Visit | Attending: Internal Medicine | Admitting: Internal Medicine

## 2018-07-13 ENCOUNTER — Encounter (HOSPITAL_COMMUNITY): Payer: Self-pay | Admitting: *Deleted

## 2018-07-13 DIAGNOSIS — Z7982 Long term (current) use of aspirin: Secondary | ICD-10-CM | POA: Insufficient documentation

## 2018-07-13 DIAGNOSIS — K3189 Other diseases of stomach and duodenum: Secondary | ICD-10-CM | POA: Insufficient documentation

## 2018-07-13 DIAGNOSIS — I2510 Atherosclerotic heart disease of native coronary artery without angina pectoris: Secondary | ICD-10-CM | POA: Diagnosis not present

## 2018-07-13 DIAGNOSIS — N189 Chronic kidney disease, unspecified: Secondary | ICD-10-CM | POA: Insufficient documentation

## 2018-07-13 DIAGNOSIS — Z79899 Other long term (current) drug therapy: Secondary | ICD-10-CM | POA: Insufficient documentation

## 2018-07-13 DIAGNOSIS — Z7984 Long term (current) use of oral hypoglycemic drugs: Secondary | ICD-10-CM | POA: Diagnosis not present

## 2018-07-13 DIAGNOSIS — D509 Iron deficiency anemia, unspecified: Secondary | ICD-10-CM | POA: Diagnosis not present

## 2018-07-13 DIAGNOSIS — E1122 Type 2 diabetes mellitus with diabetic chronic kidney disease: Secondary | ICD-10-CM | POA: Insufficient documentation

## 2018-07-13 DIAGNOSIS — I129 Hypertensive chronic kidney disease with stage 1 through stage 4 chronic kidney disease, or unspecified chronic kidney disease: Secondary | ICD-10-CM | POA: Insufficient documentation

## 2018-07-13 DIAGNOSIS — Z87891 Personal history of nicotine dependence: Secondary | ICD-10-CM | POA: Diagnosis not present

## 2018-07-13 DIAGNOSIS — E785 Hyperlipidemia, unspecified: Secondary | ICD-10-CM | POA: Diagnosis not present

## 2018-07-13 DIAGNOSIS — K5730 Diverticulosis of large intestine without perforation or abscess without bleeding: Secondary | ICD-10-CM | POA: Diagnosis not present

## 2018-07-13 DIAGNOSIS — K449 Diaphragmatic hernia without obstruction or gangrene: Secondary | ICD-10-CM | POA: Diagnosis not present

## 2018-07-13 HISTORY — PX: BIOPSY: SHX5522

## 2018-07-13 HISTORY — PX: ESOPHAGOGASTRODUODENOSCOPY: SHX5428

## 2018-07-13 HISTORY — PX: COLONOSCOPY: SHX5424

## 2018-07-13 LAB — GLUCOSE, CAPILLARY: GLUCOSE-CAPILLARY: 147 mg/dL — AB (ref 70–99)

## 2018-07-13 SURGERY — COLONOSCOPY
Anesthesia: Moderate Sedation

## 2018-07-13 MED ORDER — MIDAZOLAM HCL 5 MG/5ML IJ SOLN
INTRAMUSCULAR | Status: AC
  Filled 2018-07-13: qty 10

## 2018-07-13 MED ORDER — ONDANSETRON HCL 4 MG/2ML IJ SOLN
4.0000 mg | Freq: Once | INTRAMUSCULAR | Status: AC
  Administered 2018-07-13: 4 mg via INTRAVENOUS

## 2018-07-13 MED ORDER — ONDANSETRON HCL 4 MG PO TABS
4.0000 mg | ORAL_TABLET | Freq: Once | ORAL | Status: AC
  Administered 2018-07-13: 4 mg via ORAL
  Filled 2018-07-13: qty 1

## 2018-07-13 MED ORDER — ONDANSETRON 4 MG PO TBDP
ORAL_TABLET | ORAL | Status: AC
  Filled 2018-07-13: qty 1

## 2018-07-13 MED ORDER — LIDOCAINE VISCOUS HCL 2 % MT SOLN
OROMUCOSAL | Status: DC | PRN
  Administered 2018-07-13: 1 via OROMUCOSAL

## 2018-07-13 MED ORDER — MEPERIDINE HCL 100 MG/ML IJ SOLN
INTRAMUSCULAR | Status: DC | PRN
  Administered 2018-07-13: 15 mg
  Administered 2018-07-13: 25 mg
  Administered 2018-07-13: 15 mg via INTRAVENOUS

## 2018-07-13 MED ORDER — STERILE WATER FOR IRRIGATION IR SOLN
Status: DC | PRN
  Administered 2018-07-13: 1.5 mL

## 2018-07-13 MED ORDER — LIDOCAINE VISCOUS HCL 2 % MT SOLN
OROMUCOSAL | Status: AC
  Filled 2018-07-13: qty 15

## 2018-07-13 MED ORDER — SODIUM CHLORIDE 0.9 % IV SOLN
INTRAVENOUS | Status: DC
  Administered 2018-07-13: 08:00:00 via INTRAVENOUS

## 2018-07-13 MED ORDER — MEPERIDINE HCL 50 MG/ML IJ SOLN
INTRAMUSCULAR | Status: AC
  Filled 2018-07-13: qty 1

## 2018-07-13 MED ORDER — MIDAZOLAM HCL 5 MG/5ML IJ SOLN
INTRAMUSCULAR | Status: DC | PRN
  Administered 2018-07-13 (×4): 1 mg via INTRAVENOUS
  Administered 2018-07-13: 0.5 mg via INTRAVENOUS

## 2018-07-13 MED ORDER — ONDANSETRON HCL 4 MG/2ML IJ SOLN
INTRAMUSCULAR | Status: AC
  Filled 2018-07-13: qty 2

## 2018-07-13 NOTE — Op Note (Signed)
[facility] Patient Name: Craig Taylor Procedure Date: 07/13/2018 8:16 AM MRN: [id number] Date of Birth: [birth year] Attending MD: Norvel Richards , MD CSN: [id number] Age: 77 Admit Type: Outpatient Procedure:                Colonoscopy Indications:              Unexplained iron deficiency anemia Providers:                Norvel Richards, MD, Lurline Del, RN, Gerome Sam, RN Referring MD:              Medicines:                Midazolam 4 mg IV, Meperidine 55 mg IV Complications:            No immediate complications. Estimated Blood Loss:     Estimated blood loss: none. Procedure:                Pre-Anesthesia Assessment:                           - Prior to the procedure, a History and Physical                            was performed, and patient medications and                            allergies were reviewed. The patient's tolerance of                            previous anesthesia was also reviewed. The risks                            and benefits of the procedure and the sedation                            options and risks were discussed with the patient.                            All questions were answered, and informed consent                            was obtained. Prior Anticoagulants: The patient has                            taken no previous anticoagulant or antiplatelet                            agents. ASA Grade Assessment: II - A patient with                            mild systemic disease. After reviewing the risks  and benefits, the patient was deemed in                            satisfactory condition to undergo the procedure.                           After obtaining informed consent, the colonoscope                            was passed under direct vision. Throughout the                            procedure, the patient's blood pressure, pulse, and   oxygen saturations were monitored continuously. The                            CF-HQ190L ([id number]) scope was introduced through                            the anus and advanced to the 10 cm into the ileum.                            The colonoscopy was performed without difficulty.                            The patient tolerated the procedure well. The                            quality of the bowel preparation was adequate. The                            terminal ileum, ileocecal valve, appendiceal                            orifice, and rectum were photographed. The entire                            colon was well visualized. Scope In: 8:29:44 AM Scope Out: 8:51:04 AM Scope Withdrawal Time: 0 hours 13 minutes 7 seconds  Total Procedure Duration: 0 hours 21 minutes 20 seconds  Findings:      The perianal and digital rectal examinations were normal.      Scattered small and large-mouthed diverticula were found in the sigmoid       colon and descending colon.      The exam was otherwise without abnormality on direct and retroflexion       views. Distal 10cm of TI appeared normal. Impression:               - Diverticulosis in the sigmoid colon and in the                            descending colon.                           - The examination was otherwise normal on direct  and retroflexion views.                           - No specimens collected. Moderate Sedation:      Moderate (conscious) sedation was administered by the endoscopy nurse       and supervised by the endoscopist. The following parameters were       monitored: oxygen saturation, heart rate, blood pressure, respiratory       rate, EKG, adequacy of pulmonary ventilation, and response to care.       Total physician intraservice time was 28 minutes. Recommendation:           - Patient has a contact number available for                            emergencies. The signs and symptoms of potential                             delayed complications were discussed with the                            patient. Return to normal activities tomorrow.                            Written discharge instructions were provided to the                            patient.                           - Resume previous diet.                           - No repeat colonoscopy.                           - Return to GI clinic (date not yet determined).                            See EGD report. Procedure Code(s):        --- Professional ---                           [phone], Colonoscopy, flexible; diagnostic, including                            collection of specimen(s) by brushing or washing,                            when performed (separate procedure)                           99153, Moderate sedation; each additional 15                            minutes intraservice time  G0500, Moderate sedation services provided by the                            same physician or other qualified health care                            professional performing a gastrointestinal                            endoscopic service that sedation supports,                            requiring the presence of an independent trained                            observer to assist in the monitoring of the                            patient's level of consciousness and physiological                            status; initial 15 minutes of intra-service time;                            patient age 22 years or older (additional time may                            be reported with [phone], as appropriate) Diagnosis Code(s):        --- Professional ---                           D50.9, Iron deficiency anemia, unspecified                           K57.30, Diverticulosis of large intestine without                            perforation or abscess without bleeding CPT copyright 2018 [employer]. All rights  reserved. The codes documented in this report are preliminary and upon coder review may  be revised to meet current compliance requirements. Cristopher Estimable. Velma Hanna, MD Norvel Richards, MD 07/13/2018 8:56:38 AM This report has been signed electronically. Number of Addenda: 0

## 2018-07-13 NOTE — Discharge Instructions (Signed)
°Colonoscopy °Discharge Instructions ° °Read the instructions outlined below and refer to this sheet in the next few weeks. These discharge instructions provide you with general information on caring for yourself after you leave the hospital. Your doctor may also give you specific instructions. While your treatment has been planned according to the most current medical practices available, unavoidable complications occasionally occur. If you have any problems or questions after discharge, call Dr. Kaiyan Luczak at [phone]. °ACTIVITY °· You may resume your regular activity, but move at a slower pace for the next 24 hours.  °· Take frequent rest periods for the next 24 hours.  °· Walking will help get rid of the air and reduce the bloated feeling in your belly (abdomen).  °· No driving for 24 hours (because of the medicine (anesthesia) used during the test).   °· Do not sign any important legal documents or operate any machinery for 24 hours (because of the anesthesia used during the test).  °NUTRITION °· Drink plenty of fluids.  °· You may resume your normal diet as instructed by your doctor.  °· Begin with a light meal and progress to your normal diet. Heavy or fried foods are harder to digest and may make you feel sick to your stomach (nauseated).  °· Avoid alcoholic beverages for 24 hours or as instructed.  °MEDICATIONS °· You may resume your normal medications unless your doctor tells you otherwise.  °WHAT YOU CAN EXPECT TODAY °· Some feelings of bloating in the abdomen.  °· Passage of more gas than usual.  °· Spotting of blood in your stool or on the toilet paper.  °IF YOU HAD POLYPS REMOVED DURING THE COLONOSCOPY: °· No aspirin products for 7 days or as instructed.  °· No alcohol for 7 days or as instructed.  °· Eat a soft diet for the next 24 hours.  °FINDING OUT THE RESULTS OF YOUR TEST °Not all test results are available during your visit. If your test results are not back during the visit, make an appointment  with your caregiver to find out the results. Do not assume everything is normal if you have not heard from your caregiver or the medical facility. It is important for you to follow up on all of your test results.  °SEEK IMMEDIATE MEDICAL ATTENTION IF: °· You have more than a spotting of blood in your stool.  °· Your belly is swollen (abdominal distention).  °· You are nauseated or vomiting.  °· You have a temperature over 101.  °· You have abdominal pain or discomfort that is severe or gets worse throughout the day.  °EGD °Discharge instructions °Please read the instructions outlined below and refer to this sheet in the next few weeks. These discharge instructions provide you with general information on caring for yourself after you leave the hospital. Your doctor may also give you specific instructions. While your treatment has been planned according to the most current medical practices available, unavoidable complications occasionally occur. If you have any problems or questions after discharge, please call your doctor. °ACTIVITY °· You may resume your regular activity but move at a slower pace for the next 24 hours.  °· Take frequent rest periods for the next 24 hours.  °· Walking will help expel (get rid of) the air and reduce the bloated feeling in your abdomen.  °· No driving for 24 hours (because of the anesthesia (medicine) used during the test).  °· You may shower.  °· Do not sign any important   legal documents or operate any machinery for 24 hours (because of the anesthesia used during the test).  NUTRITION  Drink plenty of fluids.   You may resume your normal diet.   Begin with a light meal and progress to your normal diet.   Avoid alcoholic beverages for 24 hours or as instructed by your caregiver.  MEDICATIONS  You may resume your normal medications unless your caregiver tells you otherwise.  WHAT YOU CAN EXPECT TODAY  You may experience abdominal discomfort such as a feeling of fullness  or gas pains.  FOLLOW-UP  Your doctor will discuss the results of your test with you.  SEEK IMMEDIATE MEDICAL ATTENTION IF ANY OF THE FOLLOWING OCCUR:  Excessive nausea (feeling sick to your stomach) and/or vomiting.   Severe abdominal pain and distention (swelling).   Trouble swallowing.   Temperature over 101 F (37.8 C).   Rectal bleeding or vomiting of blood.    Colon diverticulosis information provided  Further recommendations to follow pending review of pathology report

## 2018-07-13 NOTE — Op Note (Signed)
[facility] Patient Name: Craig Taylor Procedure Date: 07/13/2018 8:51 AM MRN: [id number] Date of Birth: 1941/04/05 Attending MD: Norvel Richards , MD CSN: [id number] Age: 77 Admit Type: Outpatient Procedure:                Upper GI endoscopy Indications:              Unexplained iron deficiency anemia Providers:                Norvel Richards, MD, Lurline Del, RN, Gerome Sam, RN Referring MD:              Medicines:                Midazolam 5 mg IV, Meperidine 55 mg IV Complications:            No immediate complications. Estimated Blood Loss:     Estimated blood loss: none. Procedure:                Pre-Anesthesia Assessment:                           - Prior to the procedure, a History and Physical                            was performed, and patient medications and                            allergies were reviewed. The patient's tolerance of                            previous anesthesia was also reviewed. The risks                            and benefits of the procedure and the sedation                            options and risks were discussed with the patient.                            All questions were answered, and informed consent                            was obtained. Prior Anticoagulants: The patient has                            taken no previous anticoagulant or antiplatelet                            agents. ASA Grade Assessment: II - A patient with                            mild systemic disease. After reviewing the risks  and benefits, the patient was deemed in                            satisfactory condition to undergo the procedure.                           After obtaining informed consent, the endoscope was                            passed under direct vision. Throughout the                            procedure, the patient's blood pressure, pulse, and    oxygen saturations were monitored continuously. The                            [id number]-H190 ([id number]) scope was introduced through the                            mouth, and advanced to the second part of duodenum.                            The upper GI endoscopy was accomplished without                            difficulty. The patient tolerated the procedure                            well. Scope In: 8:57:57 AM Scope Out: 9:04:04 AM Total Procedure Duration: 0 hours 6 minutes 7 seconds  Findings:      The examined esophagus was normal.      A small hiatal hernia was present.      A few dispersed erosions were found in the entire examined stomach. No       ulcer or infiltrating process seen. This was biopsied with a cold       forceps for histology. Estimated blood loss was minimal.      The duodenal bulb, second portion of the duodenum and examined duodenum       were normal. Impression:               - Normal esophagus.                           - Small hiatal hernia.                           - Erosive gastropathy. Biopsied.                           - Normal duodenal bulb, second portion of the                            duodenum and examined duodenum. Moderate Sedation:      Moderate (conscious) sedation was administered by the endoscopy nurse       and supervised by the endoscopist. The following parameters were  monitored: oxygen saturation, heart rate, blood pressure, respiratory       rate, EKG, adequacy of pulmonary ventilation, and response to care.       Total physician intraservice time was 41 minutes. Recommendation:           - Patient has a contact number available for                            emergencies. The signs and symptoms of potential                            delayed complications were discussed with the                            patient. Return to normal activities tomorrow.                            Written discharge instructions were provided to the                             patient.                           - Advance diet as tolerated. See colonoscopy                            report. Further recommendations to follow pending                            review of pathology report. Procedure Code(s):        --- Professional ---                           [phone], Esophagogastroduodenoscopy, flexible,                            transoral; with biopsy, single or multiple                           99153, Moderate sedation; each additional 15                            minutes intraservice time                           99153, Moderate sedation; each additional 15                            minutes intraservice time                           G0500, Moderate sedation services provided by the                            same physician or other qualified health care  professional performing a gastrointestinal                            endoscopic service that sedation supports,                            requiring the presence of an independent trained                            observer to assist in the monitoring of the                            patient's level of consciousness and physiological                            status; initial 15 minutes of intra-service time;                            patient age 78 years or older (additional time may                            be reported with [phone], as appropriate) Diagnosis Code(s):        --- Professional ---                           K44.9, Diaphragmatic hernia without obstruction or                            gangrene                           K31.89, Other diseases of stomach and duodenum                           D50.9, Iron deficiency anemia, unspecified CPT copyright 2018 American Medical Association. All rights reserved. The codes documented in this report are preliminary and upon coder review may  be revised to meet current compliance requirements. Cristopher Estimable. Mccartney Brucks, MD Norvel Richards, MD 07/13/2018 9:11:12 AM This report has been signed electronically. Number of Addenda: 0

## 2018-07-13 NOTE — H&P (Signed)
@LOGO @   Primary Care Physician:  Redmond School, MD Primary Gastroenterologist:  Dr. Gala Romney  Pre-Procedure History & Physical: HPI:  Craig Taylor is a 77 y.o. male here for further evaluation of iron deficiency anemia via colonoscopy and possible EGD. Past Medical History:  Diagnosis Date  . Chronic renal insufficiency   . Coronary artery disease   . Hyperlipidemia   . Hypertension   . Nephrolithiasis   . Non-insulin dependent type 2 diabetes mellitus [facility])     Past Surgical History:  Procedure Laterality Date  . CARDIAC CATHETERIZATION  08/03/2002   No intervention  . CARDIAC CATHETERIZATION  08/05/2002   Crux of RCA 95% stenosis, stented with a 2.5x42mm CYPHER stent resulting in reduction of 95% stenosis to 0% residual; overlapping CYPHER stents -3x60mm and 3x67mm- deployed at 15atm resulting in reduction of 70% stenosis to 0% residual.  . CARDIAC CATHETERIZATION  10/04/2002   No intervention  . CARDIOVASCULAR STRESS TEST  09/07/2009   Perfusion defect seen in inferior region consistent with diaphragmatic attenuation. Remaining myocardium demonstrates normal myocardial perfusion with no evidence of ischemia or infarct  . CAROTID DOPPLER  09/07/2009   No evidence of bilateral diameter reduction, significant tortuosity, or any other vascular abnormality  . Colonoscopy    . COLONOSCOPY N/A 05/31/2014   Dr. Gala Romney: Normal  . TRANSTHORACIC ECHOCARDIOGRAM  03/30/2006   EF 50-55%, LA moderate-severely dilated,     Prior to Admission medications   Medication Sig Start Date End Date Taking? Authorizing Provider  aspirin EC 81 MG tablet Take 81 mg by mouth daily.   Yes [provider]  enalapril (VASOTEC) 2.5 MG tablet Take 2.5 mg by mouth daily.   Yes [provider]  fenofibrate micronized (LOFIBRA) 134 MG capsule Take 1 capsule (134 mg total) by mouth daily before breakfast. 11/22/13  Yes Lorretta Harp, MD  FERREX 150 150 MG capsule Take 150 mg by mouth 2  (two) times daily. 02/04/18  Yes [provider]  lubiprostone (AMITIZA) 24 MCG capsule Take 1 capsule (24 mcg total) by mouth 2 (two) times daily with a meal. 05/24/18  Yes Mahala Menghini, PA-C  metFORMIN (GLUCOPHAGE) 500 MG tablet Take 500 mg by mouth 2 (two) times daily with a meal.   Yes [provider]  metoprolol tartrate (LOPRESSOR) 50 MG tablet TAKE 1 TABLET BY MOUTH TWICE DAILY 03/02/18  Yes Herminio Commons, MD  Omega-3 Fatty Acids (FISH OIL PO) Take by mouth.   Yes [provider]  ondansetron (ZOFRAN) 4 MG tablet Take 1 tablet (4 mg total) by mouth every 6 (six) hours as needed for nausea or vomiting. 06/18/18  Yes Hayden Rasmussen, MD  polyethylene glycol-electrolytes (TRILYTE) 420 g solution Take 4,000 mLs by mouth as directed. 05/24/18  Yes Ariez Neilan, Cristopher Estimable, MD  ROCKLATAN 0.02-0.005 % SOLN Place 1 drop into both eyes at bedtime. 05/28/18  Yes [provider]  simvastatin (ZOCOR) 40 MG tablet Take 1 tablet (40 mg total) by mouth daily. 04/19/18  Yes Herminio Commons, MD  timolol (TIMOPTIC) 0.5 % ophthalmic solution Place 1 drop into the right eye daily. 05/28/18  Yes [provider]  nitroGLYCERIN (NITROSTAT) 0.4 MG SL tablet Place 1 tablet (0.4 mg total) under the tongue every 5 (five) minutes as needed for chest pain. 04/08/18   Herminio Commons, MD    Allergies as of 05/24/2018 - Review Complete 05/24/2018  Allergen Reaction Noted  . Lipitor [atorvastatin]  12/08/2013  .  Penicillins  08/03/2013  . Voltaren [diclofenac sodium]  08/03/2013    Family History  Problem Relation Age of Onset  . Stroke Mother   . Diabetes Mother   . Colon cancer Neg Hx     Social History   Socioeconomic History  . Marital status: Widowed    Spouse name: Not on file  . Number of children: Not on file  . Years of education: Not on file  . Highest education level: Not on file  Occupational History  . Not on file  Social Needs  .  Financial resource strain: Not on file  . Food insecurity:    Worry: Not on file    Inability: Not on file  . Transportation needs:    Medical: Not on file    Non-medical: Not on file  Tobacco Use  . Smoking status: Former Smoker    Packs/day: 2.00    Years: 21.00    Pack years: 42.00    Types: Cigarettes    Start date: 07/04/1957    Last attempt to quit: 08/09/1983    Years since quitting: 34.9  . Smokeless tobacco: Former Systems developer    Quit date: 08/11/2012  . Tobacco comment: quit chewing tobacco aug 2014  Substance and Sexual Activity  . Alcohol use: No    Alcohol/week: 0.0 standard drinks  . Drug use: No  . Sexual activity: Never  Lifestyle  . Physical activity:    Days per week: Not on file    Minutes per session: Not on file  . Stress: Not on file  Relationships  . Social connections:    Talks on phone: Not on file    Gets together: Not on file    Attends religious service: Not on file    Active member of club or organization: Not on file    Attends meetings of clubs or organizations: Not on file    Relationship status: Not on file  . Intimate partner violence:    Fear of current or ex partner: Not on file    Emotionally abused: Not on file    Physically abused: Not on file    Forced sexual activity: Not on file  Other Topics Concern  . Not on file  Social History Narrative  . Not on file    Review of Systems: See HPI, otherwise negative ROS  Physical Exam: BP (!) 149/77   Pulse 90   Temp (!) 97.4 F (36.3 C) (Oral)   Resp 16   Ht 6\' 6"  (1.981 m)   Wt 114.3 kg   SpO2 94%   BMI 29.12 kg/m  General:   Alert,  Well-developed, well-nourished, pleasant and cooperative in NAD Neck:  Supple; no masses or thyromegaly. No significant cervical adenopathy. Lungs:  Clear throughout to auscultation.   No wheezes, crackles, or rhonchi. No acute distress. Heart:  Regular rate and rhythm; no murmurs, clicks, rubs,  or gallops. Abdomen: Non-distended, normal bowel  sounds.  Soft and nontender without appreciable mass or hepatosplenomegaly.  Pulses:  Normal pulses noted. Extremities:  Without clubbing or edema.  Impression/Plan: 77 year old gentleman with history of iron deficiency anemia.  Here for colonoscopy with possible EGD to further evaluate. No upper GI tract symptoms.  The risks, benefits, limitations, imponderables and alternatives regarding both EGD and colonoscopy have been reviewed with the patient. Questions have been answered. All parties agreeable.      Notice: This dictation was prepared with Dragon dictation along with smaller phrase technology. Any transcriptional errors  that result from this process are unintentional and may not be corrected upon review.

## 2018-07-15 ENCOUNTER — Telehealth: Payer: Self-pay

## 2018-07-15 ENCOUNTER — Encounter: Payer: Self-pay | Admitting: Internal Medicine

## 2018-07-15 NOTE — Telephone Encounter (Signed)
Per RMR- Send letter to patient.  Send copy of letter with path to referring provider and PCP.   Please, schedule OV with extender who saw him originally in about 4 to 6 weeks to reassess IDA and need for further evaluation of small intestine

## 2018-07-15 NOTE — Telephone Encounter (Signed)
OV made and letter mailed °

## 2018-07-19 ENCOUNTER — Encounter (HOSPITAL_COMMUNITY): Payer: Self-pay | Admitting: Internal Medicine

## 2018-07-23 DIAGNOSIS — H40013 Open angle with borderline findings, low risk, bilateral: Secondary | ICD-10-CM | POA: Diagnosis not present

## 2018-07-23 DIAGNOSIS — H10413 Chronic giant papillary conjunctivitis, bilateral: Secondary | ICD-10-CM | POA: Diagnosis not present

## 2018-07-23 DIAGNOSIS — H40011 Open angle with borderline findings, low risk, right eye: Secondary | ICD-10-CM | POA: Diagnosis not present

## 2018-07-23 DIAGNOSIS — H40012 Open angle with borderline findings, low risk, left eye: Secondary | ICD-10-CM | POA: Diagnosis not present

## 2018-07-23 DIAGNOSIS — H2513 Age-related nuclear cataract, bilateral: Secondary | ICD-10-CM | POA: Diagnosis not present

## 2018-08-16 DIAGNOSIS — D509 Iron deficiency anemia, unspecified: Secondary | ICD-10-CM | POA: Diagnosis not present

## 2018-08-16 DIAGNOSIS — Z79899 Other long term (current) drug therapy: Secondary | ICD-10-CM | POA: Diagnosis not present

## 2018-08-16 DIAGNOSIS — R809 Proteinuria, unspecified: Secondary | ICD-10-CM | POA: Diagnosis not present

## 2018-08-16 DIAGNOSIS — E559 Vitamin D deficiency, unspecified: Secondary | ICD-10-CM | POA: Diagnosis not present

## 2018-08-16 DIAGNOSIS — N183 Chronic kidney disease, stage 3 (moderate): Secondary | ICD-10-CM | POA: Diagnosis not present

## 2018-08-16 DIAGNOSIS — I10 Essential (primary) hypertension: Secondary | ICD-10-CM | POA: Diagnosis not present

## 2018-09-03 DIAGNOSIS — R809 Proteinuria, unspecified: Secondary | ICD-10-CM | POA: Diagnosis not present

## 2018-09-03 DIAGNOSIS — D509 Iron deficiency anemia, unspecified: Secondary | ICD-10-CM | POA: Diagnosis not present

## 2018-09-03 DIAGNOSIS — E559 Vitamin D deficiency, unspecified: Secondary | ICD-10-CM | POA: Diagnosis not present

## 2018-09-03 DIAGNOSIS — N182 Chronic kidney disease, stage 2 (mild): Secondary | ICD-10-CM | POA: Diagnosis not present

## 2018-10-01 ENCOUNTER — Ambulatory Visit: Payer: PPO | Admitting: Gastroenterology

## 2018-10-22 DIAGNOSIS — H2513 Age-related nuclear cataract, bilateral: Secondary | ICD-10-CM | POA: Diagnosis not present

## 2018-10-22 DIAGNOSIS — H401113 Primary open-angle glaucoma, right eye, severe stage: Secondary | ICD-10-CM | POA: Diagnosis not present

## 2018-10-22 DIAGNOSIS — H401121 Primary open-angle glaucoma, left eye, mild stage: Secondary | ICD-10-CM | POA: Diagnosis not present

## 2018-11-12 DIAGNOSIS — E039 Hypothyroidism, unspecified: Secondary | ICD-10-CM | POA: Diagnosis not present

## 2018-11-12 DIAGNOSIS — I10 Essential (primary) hypertension: Secondary | ICD-10-CM | POA: Diagnosis not present

## 2018-11-12 DIAGNOSIS — E782 Mixed hyperlipidemia: Secondary | ICD-10-CM | POA: Diagnosis not present

## 2018-11-12 DIAGNOSIS — M19011 Primary osteoarthritis, right shoulder: Secondary | ICD-10-CM | POA: Diagnosis not present

## 2018-11-12 DIAGNOSIS — D649 Anemia, unspecified: Secondary | ICD-10-CM | POA: Diagnosis not present

## 2018-11-12 DIAGNOSIS — N183 Chronic kidney disease, stage 3 unspecified: Secondary | ICD-10-CM | POA: Diagnosis not present

## 2018-11-12 DIAGNOSIS — E669 Obesity, unspecified: Secondary | ICD-10-CM | POA: Diagnosis not present

## 2018-11-12 DIAGNOSIS — M19012 Primary osteoarthritis, left shoulder: Secondary | ICD-10-CM | POA: Diagnosis not present

## 2018-11-12 DIAGNOSIS — M8700 Idiopathic aseptic necrosis of unspecified bone: Secondary | ICD-10-CM | POA: Diagnosis not present

## 2018-11-12 DIAGNOSIS — R112 Nausea with vomiting, unspecified: Secondary | ICD-10-CM | POA: Diagnosis not present

## 2018-11-12 DIAGNOSIS — F1190 Opioid use, unspecified, uncomplicated: Secondary | ICD-10-CM | POA: Diagnosis not present

## 2018-11-12 DIAGNOSIS — Z79899 Other long term (current) drug therapy: Secondary | ICD-10-CM | POA: Diagnosis not present

## 2018-11-12 DIAGNOSIS — E119 Type 2 diabetes mellitus without complications: Secondary | ICD-10-CM | POA: Diagnosis not present

## 2018-11-12 DIAGNOSIS — E7849 Other hyperlipidemia: Secondary | ICD-10-CM | POA: Diagnosis not present

## 2018-11-12 DIAGNOSIS — R5383 Other fatigue: Secondary | ICD-10-CM | POA: Diagnosis not present

## 2018-11-12 DIAGNOSIS — Z23 Encounter for immunization: Secondary | ICD-10-CM | POA: Diagnosis not present

## 2018-11-12 DIAGNOSIS — E210 Primary hyperparathyroidism: Secondary | ICD-10-CM | POA: Diagnosis not present

## 2018-11-12 DIAGNOSIS — I517 Cardiomegaly: Secondary | ICD-10-CM | POA: Diagnosis not present

## 2018-11-12 DIAGNOSIS — M25511 Pain in right shoulder: Secondary | ICD-10-CM | POA: Diagnosis not present

## 2018-11-12 DIAGNOSIS — G894 Chronic pain syndrome: Secondary | ICD-10-CM | POA: Diagnosis not present

## 2018-11-12 DIAGNOSIS — G8929 Other chronic pain: Secondary | ICD-10-CM | POA: Diagnosis not present

## 2018-11-12 DIAGNOSIS — Z0001 Encounter for general adult medical examination with abnormal findings: Secondary | ICD-10-CM | POA: Diagnosis not present

## 2018-11-12 DIAGNOSIS — Z1389 Encounter for screening for other disorder: Secondary | ICD-10-CM | POA: Diagnosis not present

## 2018-11-12 DIAGNOSIS — E1169 Type 2 diabetes mellitus with other specified complication: Secondary | ICD-10-CM | POA: Diagnosis not present

## 2018-11-12 DIAGNOSIS — D519 Vitamin B12 deficiency anemia, unspecified: Secondary | ICD-10-CM | POA: Diagnosis not present

## 2018-11-12 DIAGNOSIS — M25512 Pain in left shoulder: Secondary | ICD-10-CM | POA: Diagnosis not present

## 2018-11-12 DIAGNOSIS — E860 Dehydration: Secondary | ICD-10-CM | POA: Diagnosis not present

## 2018-11-12 DIAGNOSIS — Z6833 Body mass index (BMI) 33.0-33.9, adult: Secondary | ICD-10-CM | POA: Diagnosis not present

## 2018-11-12 DIAGNOSIS — Z01411 Encounter for gynecological examination (general) (routine) with abnormal findings: Secondary | ICD-10-CM | POA: Diagnosis not present

## 2018-11-12 DIAGNOSIS — E785 Hyperlipidemia, unspecified: Secondary | ICD-10-CM | POA: Diagnosis not present

## 2018-11-12 DIAGNOSIS — N171 Acute kidney failure with acute cortical necrosis: Secondary | ICD-10-CM | POA: Diagnosis not present

## 2018-11-12 DIAGNOSIS — R918 Other nonspecific abnormal finding of lung field: Secondary | ICD-10-CM | POA: Diagnosis not present

## 2018-11-12 DIAGNOSIS — J90 Pleural effusion, not elsewhere classified: Secondary | ICD-10-CM | POA: Diagnosis not present

## 2018-11-12 DIAGNOSIS — L97512 Non-pressure chronic ulcer of other part of right foot with fat layer exposed: Secondary | ICD-10-CM | POA: Diagnosis not present

## 2018-11-12 DIAGNOSIS — R197 Diarrhea, unspecified: Secondary | ICD-10-CM | POA: Diagnosis not present

## 2018-11-12 DIAGNOSIS — I714 Abdominal aortic aneurysm, without rupture: Secondary | ICD-10-CM | POA: Diagnosis not present

## 2018-11-12 DIAGNOSIS — T8484XA Pain due to internal orthopedic prosthetic devices, implants and grafts, initial encounter: Secondary | ICD-10-CM | POA: Diagnosis not present

## 2018-11-12 DIAGNOSIS — I482 Chronic atrial fibrillation, unspecified: Secondary | ICD-10-CM | POA: Diagnosis not present

## 2018-11-12 DIAGNOSIS — E6609 Other obesity due to excess calories: Secondary | ICD-10-CM | POA: Diagnosis not present

## 2018-11-12 DIAGNOSIS — Z6830 Body mass index (BMI) 30.0-30.9, adult: Secondary | ICD-10-CM | POA: Diagnosis not present

## 2018-11-12 DIAGNOSIS — M353 Polymyalgia rheumatica: Secondary | ICD-10-CM | POA: Diagnosis not present

## 2018-11-12 DIAGNOSIS — I723 Aneurysm of iliac artery: Secondary | ICD-10-CM | POA: Diagnosis not present

## 2018-11-12 DIAGNOSIS — E559 Vitamin D deficiency, unspecified: Secondary | ICD-10-CM | POA: Diagnosis not present

## 2018-12-09 ENCOUNTER — Encounter: Payer: Self-pay | Admitting: Nurse Practitioner

## 2018-12-09 ENCOUNTER — Ambulatory Visit (INDEPENDENT_AMBULATORY_CARE_PROVIDER_SITE_OTHER): Payer: PPO | Admitting: Nurse Practitioner

## 2018-12-09 ENCOUNTER — Other Ambulatory Visit: Payer: Self-pay

## 2018-12-09 DIAGNOSIS — K5900 Constipation, unspecified: Secondary | ICD-10-CM | POA: Diagnosis not present

## 2018-12-09 DIAGNOSIS — D509 Iron deficiency anemia, unspecified: Secondary | ICD-10-CM | POA: Diagnosis not present

## 2018-12-09 NOTE — Progress Notes (Signed)
CC'D TO PCP °

## 2018-12-09 NOTE — Progress Notes (Signed)
Referring Provider: Redmond School, MD Primary Care Physician:  Redmond School, MD Primary GI:  Dr. Gala Romney  NOTE: Service was provided via telemedicine and was requested by the patient due to COVID-19 pandemic.  Method of visit: Telephone  Patient Location: In his parked car  Provider Location: Office  Reason for Phone Visit: Follow-up  The patient was consented to phone follow-up via telephone encounter including billing of the encounter (yes/no): Yes  Persons present on the phone encounter, with roles: None  Total time (minutes) spent on medical discussion: 21 minutes  Chief Complaint  Patient presents with  . Follow-up    Anemia    HPI:   Craig Taylor is a 78 y.o. male who presents for virtual visit regarding: Follow-up on anemia.  Patient was last seen in our office 05/24/2018 for iron deficiency anemia, B12 deficiency and constipation.  History of chronic kidney disease stage III.  Labs in June 2019 resulted in initiation of his referral to [place] found hemoglobin 1.0, iron sat 12%, ferritin 16.  Three months prior to that his hemoglobin was normal at 12.3.  Vitamin B12 low at 173, folate 12.7.  Had been on iron with resulting constipation and dark stools.  Previous colonoscopy 2015 which was unremarkable.  No other GI complaints.  Recommended Amitiza 24 mcg twice daily, follow-up labs, colonoscopy with possible upper endoscopy.  Labs completed 06/23/2018 which found hemoglobin improved to 12.4, B12 remains low at 217, iron normal at 57, ferritin remains low at 12, saturation 16% (low normal). Previous iFOBT 06/17/18 was positive.  Colonoscopy was completed 07/13/2018 which found diverticulosis in the sigmoid and descending colon, otherwise normal.  EGD completed the same day found normal esophagus, small hiatal hernia, erosive gastropathy status post biopsy, normal duodenum.  Surgical pathology, biopsies to be gastric antral mucosa with nonspecific reactive gastropathy,  negative for H. pylori.  Recommended no further colonoscopy unless symptoms develop.  Today he states he's doing ok overall. He is having a lot of constipation since starting iron. He actually quit iron a couple weeks ago and now bowel are back to normal. Denies abdominal pain, N/V, fever, chills, unintentional weight loss. Denies hematochezia, melena. Stools were dark on iron. Denies URI and flu-like symptoms. Denies chest pain, dyspnea, dizziness, lightheadedness, syncope, near syncope. Denies any other upper or lower GI symptoms.  Past Medical History:  Diagnosis Date  . Chronic renal insufficiency   . Coronary artery disease   . Hyperlipidemia   . Hypertension   . Nephrolithiasis   . Non-insulin dependent type 2 diabetes mellitus [facility])     Past Surgical History:  Procedure Laterality Date  . BIOPSY  07/13/2018   Procedure: BIOPSY;  Surgeon: Daneil Dolin, MD;  [facility];  Service: Endoscopy;;  (gastric)  . CARDIAC CATHETERIZATION  08/03/2002   No intervention  . CARDIAC CATHETERIZATION  08/05/2002   Crux of RCA 95% stenosis, stented with a 2.5x45mm CYPHER stent resulting in reduction of 95% stenosis to 0% residual; overlapping CYPHER stents -3x47mm and 3x57mm- deployed at 15atm resulting in reduction of 70% stenosis to 0% residual.  . CARDIAC CATHETERIZATION  10/04/2002   No intervention  . CARDIOVASCULAR STRESS TEST  09/07/2009   Perfusion defect seen in inferior region consistent with diaphragmatic attenuation. Remaining myocardium demonstrates normal myocardial perfusion with no evidence of ischemia or infarct  . CAROTID DOPPLER  09/07/2009   No evidence of bilateral diameter reduction, significant tortuosity, or any other vascular abnormality  . Colonoscopy    .  COLONOSCOPY N/A 05/31/2014   Dr. Gala Romney: Normal  . COLONOSCOPY N/A 07/13/2018   Procedure: COLONOSCOPY;  Surgeon: Daneil Dolin, MD;  [facility];  Service: Endoscopy;  Laterality: N/A;  8:30am   . ESOPHAGOGASTRODUODENOSCOPY N/A 07/13/2018   Procedure: ESOPHAGOGASTRODUODENOSCOPY (EGD);  Surgeon: Daneil Dolin, MD;  [facility];  Service: Endoscopy;  Laterality: N/A;  . TRANSTHORACIC ECHOCARDIOGRAM  03/30/2006   EF 50-55%, LA moderate-severely dilated,     Current Outpatient Medications  Medication Sig Dispense Refill  . aspirin EC 81 MG tablet Take 81 mg by mouth daily.    . Cinnamon 500 MG TABS Take by mouth 2 (two) times daily.    . enalapril (VASOTEC) 2.5 MG tablet Take 2.5 mg by mouth daily.    . fenofibrate micronized (LOFIBRA) 134 MG capsule Take 1 capsule (134 mg total) by mouth daily before breakfast. 90 capsule 2  . FERREX 150 150 MG capsule Take 150 mg by mouth 2 (two) times daily.  6  . lubiprostone (AMITIZA) 24 MCG capsule Take 1 capsule (24 mcg total) by mouth 2 (two) times daily with a meal. 60 capsule 2  . metFORMIN (GLUCOPHAGE) 500 MG tablet Take 500 mg by mouth 2 (two) times daily with a meal.    . metoprolol tartrate (LOPRESSOR) 50 MG tablet TAKE 1 TABLET BY MOUTH TWICE DAILY 180 tablet 1  . nitroGLYCERIN (NITROSTAT) 0.4 MG SL tablet Place 1 tablet (0.4 mg total) under the tongue every 5 (five) minutes as needed for chest pain. 25 tablet 3  . Omega-3 Fatty Acids (FISH OIL PO) Take by mouth.    . ondansetron (ZOFRAN) 4 MG tablet Take 1 tablet (4 mg total) by mouth every 6 (six) hours as needed for nausea or vomiting. 20 tablet 0  . ROCKLATAN 0.02-0.005 % SOLN Place 1 drop into both eyes at bedtime.  11   No current facility-administered medications for this visit.     Allergies as of 12/09/2018 - Review Complete 12/09/2018  Allergen Reaction Noted  . Lipitor [atorvastatin]  12/08/2013  . Penicillins  08/03/2013  . Voltaren [diclofenac sodium]  08/03/2013    Family History  Problem Relation Age of Onset  . Stroke Mother   . Diabetes Mother   . Colon cancer Neg Hx     Social History   Socioeconomic History  . Marital status: Widowed     Spouse name: Not on file  . Number of children: Not on file  . Years of education: Not on file  . Highest education level: Not on file  Occupational History  . Not on file  Social Needs  . Financial resource strain: Not on file  . Food insecurity:    Worry: Not on file    Inability: Not on file  . Transportation needs:    Medical: Not on file    Non-medical: Not on file  Tobacco Use  . Smoking status: Former Smoker    Packs/day: 2.00    Years: 21.00    Pack years: 42.00    Types: Cigarettes    Start date: 07/04/1957    Last attempt to quit: 08/09/1983    Years since quitting: 35.3  . Smokeless tobacco: Former Systems developer    Quit date: 08/11/2012  . Tobacco comment: quit chewing tobacco aug 2014  Substance and Sexual Activity  . Alcohol use: No    Alcohol/week: 0.0 standard drinks  . Drug use: No  . Sexual activity: Never  Lifestyle  .  Physical activity:    Days per week: Not on file    Minutes per session: Not on file  . Stress: Not on file  Relationships  . Social connections:    Talks on phone: Not on file    Gets together: Not on file    Attends religious service: Not on file    Active member of club or organization: Not on file    Attends meetings of clubs or organizations: Not on file    Relationship status: Not on file  Other Topics Concern  . Not on file  Social History Narrative  . Not on file    Review of Systems: General: Negative for anorexia, weight loss, fever, chills, fatigue, weakness. ENT: Negative for hoarseness, difficulty swallowing. CV: Negative for chest pain, angina, palpitations, peripheral edema.  Respiratory: Negative for dyspnea at rest, cough, sputum, wheezing.  GI: See history of present illness. Endo: Negative for unusual weight change.  Heme: Negative for bruising or bleeding. Allergy: Negative for rash or hives.  Physical Exam: Note: limited exam due to virtual visit General:   Alert and oriented. Pleasant and cooperative. Ears:   Normal auditory acuity. Skin:  Intact without facial significant lesions or rashes. Neurologic:  Alert and oriented x4 Psych:  Alert and cooperative. Normal mood and affect.

## 2018-12-09 NOTE — Assessment & Plan Note (Signed)
The patient generally has well controlled constipation on Amitiza 24 mcg twice daily.  He is having acute on chronic constipation with iron supplementation.  I have discussed options with him including adding Colace 1 a day and MiraLAX 1-2 times a day as needed.  He will call us with a progress report a few days after starting this.  Follow-up in 3 months.

## 2018-12-09 NOTE — Patient Instructions (Signed)
Your health issues we discussed today were:   Anemia: 1. We will request your most recent labs from the kidney doctor 2. If these have not been recent then we may need to order additional labs 3. Call us if you notice any blood in your stools  Constipation: 1. I am sorry the iron is making you constipated 2. You can try adding Colace (over-the-counter stool softener) 1 time a day 3. You can also use MiraLAX 1-2 times a day, as needed for persistent constipation or "bad day" 4. Call us a few days after starting this and restarting her iron and let us know how you are doing  Overall I recommend:  1. Continue your current medications 2. Call us for any questions or concerns 3. Return for follow-up in 3 months.   Because of recent events of COVID-19 ("Coronavirus"), follow CDC recommendations:  1. Wash your hand frequently 2. Avoid touching your face 3. Stay away from people who are sick 4. If you have symptoms such as fever, cough, shortness of breath then call your healthcare provider for further guidance 5. If you are sick, STAY AT HOME unless otherwise directed by your healthcare provider. 6. Follow directions from state and national officials regarding staying safe   At [facility] we value your feedback. You may receive a survey about your visit today. Please share your experience as we strive to create trusting relationships with our patients to provide genuine, compassionate, quality care.  We appreciate your understanding and patience as we review any laboratory studies, imaging, and other diagnostic tests that are ordered as we care for you. Our office policy is 5 business days for review of these results, and any emergent or urgent results are addressed in a timely manner for your best interest. If you do not hear from our office in 1 week, please contact us.   We also encourage the use of MyChart, which contains your medical information for your review as  well. If you are not enrolled in this feature, an access code is on this after visit summary for your convenience. Thank you for allowing [place] to be involved in your care.  It was great to see you today!  I hope you have a great day!!

## 2018-12-09 NOTE — Assessment & Plan Note (Signed)
Iron deficiency anemia with persistently low ferritin.  Was on iron supplementation but he stopped taking this due to constipation.  He is currently on Amitiza 24 mcg.  He has stopped his iron his stools are returned to normal.  Recommended he restart iron until advised otherwise.  Discussed other options for acute on chronic constipation including adding Colace 1 a day with MiraLAX 1-2 times a day as needed.  He is to call us in a few days after starting this to give [place] a progress report as we can make further recommendations depending on his response.  We will request any recent labs from nephrology.  Depending on his lab results we may need to recheck his labs.  Follow-up in 3 months otherwise.

## 2018-12-10 ENCOUNTER — Ambulatory Visit: Payer: PPO | Admitting: Gastroenterology

## 2018-12-13 ENCOUNTER — Other Ambulatory Visit: Payer: Self-pay | Admitting: Cardiovascular Disease

## 2018-12-14 ENCOUNTER — Telehealth: Payer: Self-pay | Admitting: Cardiovascular Disease

## 2018-12-14 NOTE — Telephone Encounter (Signed)
Virtual Visit Pre-Appointment Phone Call  "(Name), I am calling you today to discuss your upcoming appointment. We are currently trying to limit exposure to the virus that causes COVID-19 by seeing patients at home rather than in the office."  1. "What is the BEST phone number to call the day of the visit?" - include this in appointment notes  2. Do you have or have access to (through a family member/friend) a smartphone with video capability that we can use for your visit?" a. If yes - list this number in appt notes as cell (if different from BEST phone #) and list the appointment type as a VIDEO visit in appointment notes b. If no - list the appointment type as a PHONE visit in appointment notes  Confirm consent - "In the setting of the current Covid19 crisis, you are scheduled for a (phone or video) visit with your provider on (date) at (time).  Just as we do with many in-office visits, in order for you to participate in this visit, we must obtain consent.  If you'd like, I can send this to your mychart (if signed up) or email for you to review.  Otherwise, I can obtain your verbal consent now.  All virtual visits are billed to your insurance company just like a normal visit would be.  By agreeing to a virtual visit, we'd like you to understand that the technology does not allow for your provider to perform an examination, and thus may limit your provider's ability to fully assess your condition. If your provider identifies any concerns that need to be evaluated in person, we will make arrangements to do so.  Finally, though the technology is pretty good, we cannot assure that it will always work on either your or our end, and in the setting of a video visit, we may have to convert it to a phone-only visit.  In either situation, we cannot ensure that we have a secure connection.  Are you willing to proceed?" STAFF: Did the patient verbally acknowledge consent to telehealth visit? Document  YES/NO here: Yes  3. Advise patient to be prepared - "Two hours prior to your appointment, go ahead and check your blood pressure, pulse, oxygen saturation, and your weight (if you have the equipment to check those) and write them all down. When your visit starts, your provider will ask you for this information. If you have an Apple Watch or Kardia device, please plan to have heart rate information ready on the day of your appointment. Please have a pen and paper handy nearby the day of the visit as well."  4. Give patient instructions for MyChart download to smartphone OR [email or web address] as below if video visit (depending on what platform provider is using)  5. Inform patient they will receive a phone call 15 minutes prior to their appointment time (may be from unknown caller ID) so they should be prepared to answer    TELEPHONE CALL NOTE  [name] has been deemed a candidate for a follow-up tele-health visit to limit community exposure during the Covid-19 pandemic. I spoke with the patient via phone to ensure availability of phone/video source, confirm preferred email & phone number, and discuss instructions and expectations.  I reminded [name] to be prepared with any vital sign and/or heart rhythm information that could potentially be obtained via home monitoring, at the time of his visit. I reminded [name] to expect a phone call prior to his  visit.  Orinda Kenner 12/14/2018 3:18 PM

## 2018-12-17 ENCOUNTER — Encounter: Payer: Self-pay | Admitting: Cardiovascular Disease

## 2018-12-17 ENCOUNTER — Telehealth (INDEPENDENT_AMBULATORY_CARE_PROVIDER_SITE_OTHER): Payer: PPO | Admitting: Cardiovascular Disease

## 2018-12-17 VITALS — BP 145/63 | HR 63 | Ht 78.0 in | Wt 260.0 lb

## 2018-12-17 DIAGNOSIS — E119 Type 2 diabetes mellitus without complications: Secondary | ICD-10-CM

## 2018-12-17 DIAGNOSIS — I10 Essential (primary) hypertension: Secondary | ICD-10-CM

## 2018-12-17 DIAGNOSIS — I25118 Atherosclerotic heart disease of native coronary artery with other forms of angina pectoris: Secondary | ICD-10-CM | POA: Diagnosis not present

## 2018-12-17 DIAGNOSIS — E785 Hyperlipidemia, unspecified: Secondary | ICD-10-CM

## 2018-12-17 MED ORDER — SIMVASTATIN 40 MG PO TABS: 40.0000 mg | ORAL_TABLET | Freq: Every day | ORAL | Status: DC

## 2018-12-17 NOTE — Addendum Note (Signed)
Addended by: Laurine Blazer on: 12/17/2018 02:04 PM   Modules accepted: Orders

## 2018-12-17 NOTE — Progress Notes (Signed)
Virtual Visit via Telephone Note   This visit type was conducted due to national recommendations for restrictions regarding the COVID-19 Pandemic (e.g. social distancing) in an effort to limit this patient's exposure and mitigate transmission in our community.  Due to his co-morbid illnesses, this patient is at least at moderate risk for complications without adequate follow up.  This format is felt to be most appropriate for this patient at this time.  The patient did not have access to video technology/had technical difficulties with video requiring transitioning to audio format only (telephone).  All issues noted in this document were discussed and addressed.  No physical exam could be performed with this format.  Please refer to the patient's chart for his  consent to telehealth for [facility].   Date:  12/17/2018   ID:  Craig Taylor, DOB [birth year], MRN [id number]  Patient Location: Home Provider Location: Home  PCP:  Redmond School, MD  Cardiologist:  Dr. Bronson Ing Electrophysiologist:  None   Evaluation Performed:  Follow-Up Visit  Chief Complaint:  CAD.  History of Present Illness:    Craig Taylor is a 78 y.o. male with coronary artery disease. He underwent RCA stenting in 2003 and also has hypertension, hyperlipidemia, and diabetes mellitus.  He denies chest pain, palpitations, and shortness of breath. He has never used nitro. He also denies leg swelling.  The patient does not have symptoms concerning for COVID-19 infection (fever, chills, cough, or new shortness of breath).    Past Medical History:  Diagnosis Date  . Chronic renal insufficiency   . Coronary artery disease   . Hyperlipidemia   . Hypertension   . Nephrolithiasis   . Non-insulin dependent type 2 diabetes mellitus [facility])    Past Surgical History:  Procedure Laterality Date  . BIOPSY  07/13/2018   Procedure: BIOPSY;  Surgeon: Daneil Dolin, MD;  [facility];  Service:  Endoscopy;;  (gastric)  . CARDIAC CATHETERIZATION  08/03/2002   No intervention  . CARDIAC CATHETERIZATION  08/05/2002   Crux of RCA 95% stenosis, stented with a 2.5x20mm CYPHER stent resulting in reduction of 95% stenosis to 0% residual; overlapping CYPHER stents -3x44mm and 3x65mm- deployed at 15atm resulting in reduction of 70% stenosis to 0% residual.  . CARDIAC CATHETERIZATION  10/04/2002   No intervention  . CARDIOVASCULAR STRESS TEST  09/07/2009   Perfusion defect seen in inferior region consistent with diaphragmatic attenuation. Remaining myocardium demonstrates normal myocardial perfusion with no evidence of ischemia or infarct  . CAROTID DOPPLER  09/07/2009   No evidence of bilateral diameter reduction, significant tortuosity, or any other vascular abnormality  . Colonoscopy    . COLONOSCOPY N/A 05/31/2014   Dr. Gala Romney: Normal  . COLONOSCOPY N/A 07/13/2018   Procedure: COLONOSCOPY;  Surgeon: Daneil Dolin, MD;  [facility];  Service: Endoscopy;  Laterality: N/A;  8:30am  . ESOPHAGOGASTRODUODENOSCOPY N/A 07/13/2018   Procedure: ESOPHAGOGASTRODUODENOSCOPY (EGD);  Surgeon: Daneil Dolin, MD;  [facility];  Service: Endoscopy;  Laterality: N/A;  . TRANSTHORACIC ECHOCARDIOGRAM  03/30/2006   EF 50-55%, LA moderate-severely dilated,      Current Meds  Medication Sig  . aspirin EC 81 MG tablet Take 81 mg by mouth daily.  . Cinnamon 500 MG TABS Take by mouth 2 (two) times daily.  . enalapril (VASOTEC) 2.5 MG tablet Take 2.5 mg by mouth daily.  . fenofibrate micronized (LOFIBRA) 134 MG capsule Take 1 capsule (134 mg total) by mouth  daily before breakfast.  . lubiprostone (AMITIZA) 24 MCG capsule Take 1 capsule (24 mcg total) by mouth 2 (two) times daily with a meal.  . metFORMIN (GLUCOPHAGE) 500 MG tablet Take 500 mg by mouth 2 (two) times daily with a meal.  . metoprolol tartrate (LOPRESSOR) 50 MG tablet Take 1 tablet by mouth twice daily  . nitroGLYCERIN  (NITROSTAT) 0.4 MG SL tablet Place 1 tablet (0.4 mg total) under the tongue every 5 (five) minutes as needed for chest pain.  . Omega-3 Fatty Acids (FISH OIL PO) Take by mouth.  . ondansetron (ZOFRAN) 4 MG tablet Take 1 tablet (4 mg total) by mouth every 6 (six) hours as needed for nausea or vomiting.  Marland Kitchen ROCKLATAN 0.02-0.005 % SOLN Place 1 drop into both eyes at bedtime.     Allergies:   Lipitor [atorvastatin]; Penicillins; and Voltaren [diclofenac sodium]   Social History   Tobacco Use  . Smoking status: Former Smoker    Packs/day: 2.00    Years: 21.00    Pack years: 42.00    Types: Cigarettes    Start date: 07/04/1957    Last attempt to quit: 08/09/1983    Years since quitting: 35.3  . Smokeless tobacco: Former Systems developer    Quit date: 08/11/2012  . Tobacco comment: quit chewing tobacco aug 2014  Substance Use Topics  . Alcohol use: No    Alcohol/week: 0.0 standard drinks  . Drug use: No     Family Hx: The patient's family history includes Diabetes in his mother; Stroke in his mother. There is no history of Colon cancer.  ROS:   Please see the history of present illness.     All other systems reviewed and are negative.   Prior CV studies:   The following studies were reviewed today:  Stress test 05/11/18:   Blood pressure demonstrated a hypertensive response to exercise.  There was no ST segment deviation noted during stress.  Defect 1: There is a medium defect of moderate severity present in the mid inferoseptal, mid inferior and apical inferior location. This is consistent with myocardial scar as well as overlying soft tissue attenuation. There are no significant ischemic territories.  This is a low risk study.  Nuclear stress EF: 55%.  Labs/Other Tests and Data Reviewed:    EKG:  No ECG reviewed.  Recent Labs: 06/18/2018: ALT 17; BUN 19; Creatinine, Ser 0.92; Potassium 3.9; Sodium 135 06/23/2018: Hemoglobin 12.4; Platelets 303   Recent Lipid Panel Lab Results   Component Value Date/Time   CHOL 132 03/07/2014 10:51 AM   TRIG 129.0 03/07/2014 10:51 AM   HDL 25.20 (L) 03/07/2014 10:51 AM   CHOLHDL 5 03/07/2014 10:51 AM   LDLCALC 81 03/07/2014 10:51 AM    Wt Readings from Last 3 Encounters:  12/17/18 260 lb (117.9 kg)  07/13/18 252 lb (114.3 kg)  06/18/18 258 lb (117 kg)     Objective:    Vital Signs:  BP (!) 145/63   Pulse 63   Ht 6\' 6"  (1.981 m)   Wt 260 lb (117.9 kg)   BMI 30.05 kg/m    VITAL SIGNS:  reviewed  ASSESSMENT & PLAN:    1. JSH:FWYOVZ ischemic heart disease. Continue aspirin, metoprolol and simvastatin.  Low risk stress test in October 2019.  2. Essential CHY:IFOYDX elevated on present therapy. No changes.  3. Hyperlipidemia: LDL 71 on 05/13/18. Continue simvastatin 40 mg.  4. DM2: A1C reportedly 6.8%. On metformin and statin.   COVID-19 Education: The  signs and symptoms of COVID-19 were discussed with the patient and how to seek care for testing (follow up with PCP or arrange E-visit).  The importance of social distancing was discussed today.  Time:   Today, I have spent 15 minutes with the patient with telehealth technology discussing the above problems.     Medication Adjustments/Labs and Tests Ordered: Current medicines are reviewed at length with the patient today.  Concerns regarding medicines are outlined above.   Tests Ordered: No orders of the defined types were placed in this encounter.   Medication Changes: No orders of the defined types were placed in this encounter.   Disposition:  Follow up in 6 month(s)  Signed, Kate Sable, MD  12/17/2018 1:28 PM    [facility]

## 2018-12-17 NOTE — Patient Instructions (Signed)

## 2019-02-14 ENCOUNTER — Other Ambulatory Visit: Payer: Self-pay | Admitting: Cardiovascular Disease

## 2019-02-25 DIAGNOSIS — H401113 Primary open-angle glaucoma, right eye, severe stage: Secondary | ICD-10-CM | POA: Diagnosis not present

## 2019-02-25 DIAGNOSIS — H2513 Age-related nuclear cataract, bilateral: Secondary | ICD-10-CM | POA: Diagnosis not present

## 2019-02-25 DIAGNOSIS — H401121 Primary open-angle glaucoma, left eye, mild stage: Secondary | ICD-10-CM | POA: Diagnosis not present

## 2019-03-10 ENCOUNTER — Ambulatory Visit (INDEPENDENT_AMBULATORY_CARE_PROVIDER_SITE_OTHER): Payer: PPO | Admitting: Nurse Practitioner

## 2019-03-10 ENCOUNTER — Other Ambulatory Visit: Payer: Self-pay

## 2019-03-10 ENCOUNTER — Encounter: Payer: Self-pay | Admitting: Nurse Practitioner

## 2019-03-10 VITALS — BP 127/75 | HR 69 | Temp 97.1°F | Ht 77.5 in | Wt 255.8 lb

## 2019-03-10 DIAGNOSIS — K5900 Constipation, unspecified: Secondary | ICD-10-CM

## 2019-03-10 DIAGNOSIS — D509 Iron deficiency anemia, unspecified: Secondary | ICD-10-CM

## 2019-03-10 NOTE — Addendum Note (Signed)
Addended by: Gordy Levan, ERIC A on: 03/10/2019 10:25 AM   Modules accepted: Orders

## 2019-03-10 NOTE — Assessment & Plan Note (Addendum)
History of iron deficiency anemia.  Reviewed labs received from nephrology in January with essentially stable labs.  No significant symptomatic anemia symptoms.  At this point being that he is now on daily iron I will recheck his labs including CBC, iron, ferritin.  Continue current medications including daily iron.  Follow-up in 6 months.

## 2019-03-10 NOTE — Progress Notes (Signed)
Cc'd to pcp 

## 2019-03-10 NOTE — Assessment & Plan Note (Signed)
Was previously having constipation on iron.  His constipation since resolved and has a bowel movement every day to every other day without straining, hard stools.  Consistently [place] 4.  Recommend he continue all his current medications, notify us of any recurrent or worsening constipation.  Follow-up in 6 months.

## 2019-03-10 NOTE — Progress Notes (Signed)
Referring Provider: Redmond School, MD Primary Care Physician:  Redmond School, MD Primary GI:  Dr. Gala Romney  Chief Complaint  Patient presents with  . Anemia    HPI:   Craig Taylor is a 78 y.o. male who presents for constipation and anemia.  The patient was last seen in our office 12/09/2018 which was a virtual office visit due to coronavirus/COVID-19 pandemic.  Previous history of iron deficiency anemia, B12 deficiency, constipation in the setting of chronic kidney disease stage III.  Labs in November 2019 found improved hemoglobin 12.4, B12 remains low at 217, normal iron 57, low ferritin of 12, saturation low normal at 16%.  Previous iFOBT was positive.  Colonoscopy up-to-date 2019 which was normal.  EGD the same day with antral mucosa with nonspecific reactive gastropathy and negative for H. pylori on biopsies.  Recommended no further colonoscopy unless symptoms develop.  At his last visit he was having a lot of constipation since starting iron and subsequently stopped taking it a couple weeks ago which helped his stools returned to normal.  No other overt GI symptoms.  Recommended we will request most recent labs from nephrology, order labs if needed, add Colace once daily and MiraLAX 1-2 times a day as needed for persistent constipation and to continue oral iron.  Follow-up in 3 months.  Labs were received from nephrology dated 08/16/2018 which found essentially normal renal panel with creatinine 1.15.  Iron saturation low normal at 15%, total iron normal at 53.  Hemoglobin remains low 12.4, vitamin D normal at 37.2, ferritin low at 12  Today he states he's doing well. No further problems with constipation. Is on iron supplementation. Denies abdominal pain, N/V, hematochezia. Has dark stools on iron. Denies fever, chills, unintentional weight loss. Denies URI or flu-like symptoms. Denies loss of sense of taste or smell. Denies chest pain, dyspnea, dizziness, lightheadedness, syncope,  near syncope. Denies any other upper or lower GI symptoms.   Past Medical History:  Diagnosis Date  . Chronic renal insufficiency   . Coronary artery disease   . Hyperlipidemia   . Hypertension   . Nephrolithiasis   . Non-insulin dependent type 2 diabetes mellitus [facility])     Past Surgical History:  Procedure Laterality Date  . BIOPSY  07/13/2018   Procedure: BIOPSY;  Surgeon: Daneil Dolin, MD;  Location: [facility];  Service: Endoscopy;;  (gastric)  . CARDIAC CATHETERIZATION  08/03/2002   No intervention  . CARDIAC CATHETERIZATION  08/05/2002   Crux of RCA 95% stenosis, stented with a 2.5x52mm CYPHER stent resulting in reduction of 95% stenosis to 0% residual; overlapping CYPHER stents -3x35mm and 3x59mm- deployed at 15atm resulting in reduction of 70% stenosis to 0% residual.  . CARDIAC CATHETERIZATION  10/04/2002   No intervention  . CARDIOVASCULAR STRESS TEST  09/07/2009   Perfusion defect seen in inferior region consistent with diaphragmatic attenuation. Remaining myocardium demonstrates normal myocardial perfusion with no evidence of ischemia or infarct  . CAROTID DOPPLER  09/07/2009   No evidence of bilateral diameter reduction, significant tortuosity, or any other vascular abnormality  . Colonoscopy    . COLONOSCOPY N/A 05/31/2014   Dr. Gala Romney: Normal  . COLONOSCOPY N/A 07/13/2018   Procedure: COLONOSCOPY;  Surgeon: Daneil Dolin, MD;  Location: [facility];  Service: Endoscopy;  Laterality: N/A;  8:30am  . ESOPHAGOGASTRODUODENOSCOPY N/A 07/13/2018   Procedure: ESOPHAGOGASTRODUODENOSCOPY (EGD);  Surgeon: Daneil Dolin, MD;  Location: [facility];  Service: Endoscopy;  Laterality:  N/A;  . TRANSTHORACIC ECHOCARDIOGRAM  03/30/2006   EF 50-55%, LA moderate-severely dilated,     Current Outpatient Medications  Medication Sig Dispense Refill  . aspirin EC 81 MG tablet Take 81 mg by mouth daily.    . Cinnamon 500 MG TABS Take by mouth 2 (two) times daily.    . enalapril  (VASOTEC) 2.5 MG tablet Take 2.5 mg by mouth daily.    . fenofibrate micronized (LOFIBRA) 134 MG capsule Take 1 capsule (134 mg total) by mouth daily before breakfast. 90 capsule 2  . FERREX 150 150 MG capsule Take 150 mg by mouth daily.   6  . metFORMIN (GLUCOPHAGE) 500 MG tablet Take 500 mg by mouth 2 (two) times daily with a meal.    . metoprolol tartrate (LOPRESSOR) 50 MG tablet Take 1 tablet by mouth twice daily 180 tablet 1  . nitroGLYCERIN (NITROSTAT) 0.4 MG SL tablet Place 1 tablet (0.4 mg total) under the tongue every 5 (five) minutes as needed for chest pain. 25 tablet 3  . Omega-3 Fatty Acids (FISH OIL PO) Take by mouth daily.     Marland Kitchen OVER THE COUNTER MEDICATION Sodium tablet takes one tablet twice daily    . ROCKLATAN 0.02-0.005 % SOLN Place 1 drop into the right eye 2 (two) times a day.   11  . simvastatin (ZOCOR) 40 MG tablet Take 1 tablet (40 mg total) by mouth at bedtime.     No current facility-administered medications for this visit.     Allergies as of 03/10/2019 - Review Complete 03/10/2019  Allergen Reaction Noted  . Lipitor [atorvastatin]  12/08/2013  . Penicillins  08/03/2013  . Voltaren [diclofenac sodium]  08/03/2013    Family History  Problem Relation Age of Onset  . Stroke Mother   . Diabetes Mother   . Colon cancer Neg Hx     Social History   Socioeconomic History  . Marital status: Widowed    Spouse name: Not on file  . Number of children: Not on file  . Years of education: Not on file  . Highest education level: Not on file  Occupational History  . Not on file  Social Needs  . Financial resource strain: Not on file  . Food insecurity    Worry: Not on file    Inability: Not on file  . Transportation needs    Medical: Not on file    Non-medical: Not on file  Tobacco Use  . Smoking status: Former Smoker    Packs/day: 2.00    Years: 21.00    Pack years: 42.00    Types: Cigarettes    Start date: 07/04/1957    Quit date: 08/09/1983    Years  since quitting: 35.6  . Smokeless tobacco: Former Systems developer    Quit date: 08/11/2012  . Tobacco comment: quit chewing tobacco aug 2014  Substance and Sexual Activity  . Alcohol use: No    Alcohol/week: 0.0 standard drinks  . Drug use: No  . Sexual activity: Never  Lifestyle  . Physical activity    Days per week: Not on file    Minutes per session: Not on file  . Stress: Not on file  Relationships  . Social Herbalist on phone: Not on file    Gets together: Not on file    Attends religious service: Not on file    Active member of club or organization: Not on file    Attends meetings of clubs or  organizations: Not on file    Relationship status: Not on file  Other Topics Concern  . Not on file  Social History Narrative  . Not on file    Review of Systems: General: Negative for anorexia, weight loss, fever, chills, fatigue, weakness. Eyes: Negative for vision changes.  ENT: Negative for hoarseness, difficulty swallowing , nasal congestion. CV: Negative for chest pain, angina, palpitations, dyspnea on exertion, peripheral edema.  Respiratory: Negative for dyspnea at rest, dyspnea on exertion, cough, sputum, wheezing.  GI: See history of present illness. GU:  Negative for dysuria, hematuria, urinary incontinence, urinary frequency, nocturnal urination.  MS: Negative for joint pain, low back pain.  Derm: Negative for rash or itching.  Neuro: Negative for weakness, abnormal sensation, seizure, frequent headaches, memory loss, confusion.  Psych: Negative for anxiety, depression, suicidal ideation, hallucinations.  Endo: Negative for unusual weight change.  Heme: Negative for bruising or bleeding. Allergy: Negative for rash or hives.   Physical Exam: BP 127/75   Pulse 69   Temp (!) 97.1 F (36.2 C) (Temporal)   Ht 6' 5.5" (1.969 m)   Wt 255 lb 12.8 oz (116 kg)   BMI 29.94 kg/m  General:   Alert and oriented. Pleasant and cooperative. Well-nourished and well-developed.   Head:  Normocephalic and atraumatic. Eyes:  Without icterus, sclera clear and conjunctiva pink.  Ears:  Normal auditory acuity. Mouth:  No deformity or lesions, oral mucosa pink.  Throat/Neck:  Supple, without mass or thyromegaly. Cardiovascular:  S1, S2 present without murmurs appreciated. Normal pulses noted. Extremities without clubbing or edema. Respiratory:  Clear to auscultation bilaterally. No wheezes, rales, or rhonchi. No distress.  Gastrointestinal:  +BS, soft, non-tender and non-distended. No HSM noted. No guarding or rebound. No masses appreciated.  Rectal:  Deferred  Musculoskalatal:  Symmetrical without gross deformities. Normal posture. Skin:  Intact without significant lesions or rashes. Neurologic:  Alert and oriented x4;  grossly normal neurologically. Psych:  Alert and cooperative. Normal mood and affect. Heme/Lymph/Immune: No significant cervical adenopathy. No excessive bruising noted.    03/10/2019 10:15 AM   Disclaimer: This note was dictated with voice recognition software. Similar sounding words can inadvertently be transcribed and may not be corrected upon review.

## 2019-03-10 NOTE — Patient Instructions (Signed)
Your health issues we discussed today were:   Constipation: 1. I am glad you are not having any further problems 2. Call us if you have any worsening or returning constipation and we can make recommendations  Anemia: 1. Have your labs checked when you are able to add [facility] across the street from the [facility] emergency department, on the second floor of the three-story [facility] 2. We will call you with results and any further recommendations 3. Call us if you notice any obvious or significant bleeding.  Overall I recommend:  1. Continue your other current medications 2. Follow-up in 6 months 3. Call us if you have any questions or concerns.   Because of recent events of COVID-19 ("Coronavirus"), follow CDC recommendations:  1. Wash your hand frequently 2. Avoid touching your face 3. Stay away from people who are sick 4. If you have symptoms such as fever, cough, shortness of breath then call your healthcare provider for further guidance 5. If you are sick, STAY AT HOME unless otherwise directed by your healthcare provider. 6. Follow directions from state and national officials regarding staying safe   At [facility] we value your feedback. You may receive a survey about your visit today. Please share your experience as we strive to create trusting relationships with our patients to provide genuine, compassionate, quality care.  We appreciate your understanding and patience as we review any laboratory studies, imaging, and other diagnostic tests that are ordered as we care for you. Our office policy is 5 business days for review of these results, and any emergent or urgent results are addressed in a timely manner for your best interest. If you do not hear from our office in 1 week, please contact us.   We also encourage the use of MyChart, which contains your medical information for your review as well. If you are not enrolled in this feature,  an access code is on this after visit summary for your convenience. Thank you for allowing [place] to be involved in your care.  It was great to see you today!  I hope you have a great summer!!

## 2019-03-15 DIAGNOSIS — D509 Iron deficiency anemia, unspecified: Secondary | ICD-10-CM | POA: Diagnosis not present

## 2019-03-15 DIAGNOSIS — K5900 Constipation, unspecified: Secondary | ICD-10-CM | POA: Diagnosis not present

## 2019-03-16 LAB — CBC WITH DIFFERENTIAL/PLATELET
Absolute Monocytes: 422 cells/uL (ref 200–950)
Basophils Absolute: 34 cells/uL (ref 0–200)
Basophils Relative: 0.5 %
Eosinophils Absolute: 194 cells/uL (ref 15–500)
Eosinophils Relative: 2.9 %
HCT: 37.5 % — ABNORMAL LOW (ref 38.5–50.0)
Hemoglobin: 12.4 g/dL — ABNORMAL LOW (ref 13.2–17.1)
Lymphs Abs: 1467 cells/uL (ref 850–3900)
MCH: 28 pg (ref 27.0–33.0)
MCHC: 33.1 g/dL (ref 32.0–36.0)
MCV: 84.7 fL (ref 80.0–100.0)
MPV: 10.5 fL (ref 7.5–12.5)
Monocytes Relative: 6.3 %
Neutro Abs: 4583 cells/uL (ref 1500–7800)
Neutrophils Relative %: 68.4 %
Platelets: 265 10*3/uL (ref 140–400)
RBC: 4.43 10*6/uL (ref 4.20–5.80)
RDW: 13.6 % (ref 11.0–15.0)
Total Lymphocyte: 21.9 %
WBC: 6.7 10*3/uL (ref 3.8–10.8)

## 2019-03-16 LAB — IRON: Iron: 90 ug/dL (ref 50–180)

## 2019-03-16 LAB — FERRITIN: Ferritin: 10 ng/mL — ABNORMAL LOW (ref 24–380)

## 2019-03-17 DIAGNOSIS — R310 Gross hematuria: Secondary | ICD-10-CM | POA: Diagnosis not present

## 2019-03-17 DIAGNOSIS — Z87442 Personal history of urinary calculi: Secondary | ICD-10-CM | POA: Diagnosis not present

## 2019-03-17 DIAGNOSIS — R8279 Other abnormal findings on microbiological examination of urine: Secondary | ICD-10-CM | POA: Diagnosis not present

## 2019-03-24 DIAGNOSIS — R310 Gross hematuria: Secondary | ICD-10-CM | POA: Diagnosis not present

## 2019-03-24 DIAGNOSIS — N200 Calculus of kidney: Secondary | ICD-10-CM | POA: Diagnosis not present

## 2019-04-07 ENCOUNTER — Other Ambulatory Visit: Payer: Self-pay | Admitting: Urology

## 2019-04-07 DIAGNOSIS — D414 Neoplasm of uncertain behavior of bladder: Secondary | ICD-10-CM | POA: Diagnosis not present

## 2019-04-07 MED ORDER — GEMCITABINE CHEMO FOR BLADDER INSTILLATION 2000 MG: 2000.0000 mg | Freq: Once | INTRAVENOUS | Status: DC

## 2019-04-19 ENCOUNTER — Other Ambulatory Visit: Payer: Self-pay | Admitting: Cardiovascular Disease

## 2019-04-19 ENCOUNTER — Other Ambulatory Visit (HOSPITAL_COMMUNITY)
Admission: RE | Admit: 2019-04-19 | Discharge: 2019-04-19 | Disposition: A | Discharge status: Home / Self Care | Payer: PPO | Source: Ambulatory Visit | Attending: Urology | Admitting: Urology

## 2019-04-19 DIAGNOSIS — Z20828 Contact with and (suspected) exposure to other viral communicable diseases: Secondary | ICD-10-CM | POA: Diagnosis not present

## 2019-04-19 DIAGNOSIS — Z01812 Encounter for preprocedural laboratory examination: Secondary | ICD-10-CM | POA: Diagnosis not present

## 2019-04-20 LAB — NOVEL CORONAVIRUS, NAA (HOSP ORDER, SEND-OUT TO REF LAB; TAT 18-24 HRS): SARS-CoV-2, NAA: NOT DETECTED

## 2019-04-21 ENCOUNTER — Other Ambulatory Visit: Payer: Self-pay

## 2019-04-21 ENCOUNTER — Encounter (HOSPITAL_BASED_OUTPATIENT_CLINIC_OR_DEPARTMENT_OTHER): Payer: Self-pay | Admitting: *Deleted

## 2019-04-21 NOTE — Progress Notes (Signed)
Spoke with patient via telephone for pre op interview. NPO after MN. Patient to take Lopressor with a sip of water AM of surgery. Arrival time 1030.

## 2019-04-22 ENCOUNTER — Ambulatory Visit (HOSPITAL_BASED_OUTPATIENT_CLINIC_OR_DEPARTMENT_OTHER): Payer: PPO | Admitting: Certified Registered Nurse Anesthetist

## 2019-04-22 ENCOUNTER — Other Ambulatory Visit: Payer: Self-pay

## 2019-04-22 ENCOUNTER — Encounter (HOSPITAL_BASED_OUTPATIENT_CLINIC_OR_DEPARTMENT_OTHER)
Admission: RE | Disposition: A | Discharge status: Home / Self Care | Payer: Self-pay | Source: Home / Self Care | Attending: Urology

## 2019-04-22 ENCOUNTER — Ambulatory Visit (HOSPITAL_BASED_OUTPATIENT_CLINIC_OR_DEPARTMENT_OTHER)
Admission: RE | Admit: 2019-04-22 | Discharge: 2019-04-22 | Disposition: A | Discharge status: Home / Self Care | Payer: PPO | Attending: Urology | Admitting: Urology

## 2019-04-22 ENCOUNTER — Encounter (HOSPITAL_BASED_OUTPATIENT_CLINIC_OR_DEPARTMENT_OTHER): Payer: Self-pay

## 2019-04-22 DIAGNOSIS — Z7984 Long term (current) use of oral hypoglycemic drugs: Secondary | ICD-10-CM | POA: Diagnosis not present

## 2019-04-22 DIAGNOSIS — Z87891 Personal history of nicotine dependence: Secondary | ICD-10-CM | POA: Insufficient documentation

## 2019-04-22 DIAGNOSIS — N401 Enlarged prostate with lower urinary tract symptoms: Secondary | ICD-10-CM | POA: Insufficient documentation

## 2019-04-22 DIAGNOSIS — E7800 Pure hypercholesterolemia, unspecified: Secondary | ICD-10-CM | POA: Insufficient documentation

## 2019-04-22 DIAGNOSIS — I2510 Atherosclerotic heart disease of native coronary artery without angina pectoris: Secondary | ICD-10-CM | POA: Diagnosis not present

## 2019-04-22 DIAGNOSIS — Z79899 Other long term (current) drug therapy: Secondary | ICD-10-CM | POA: Diagnosis not present

## 2019-04-22 DIAGNOSIS — E119 Type 2 diabetes mellitus without complications: Secondary | ICD-10-CM | POA: Insufficient documentation

## 2019-04-22 DIAGNOSIS — Z880 Allergy status to penicillin: Secondary | ICD-10-CM | POA: Insufficient documentation

## 2019-04-22 DIAGNOSIS — I252 Old myocardial infarction: Secondary | ICD-10-CM | POA: Diagnosis not present

## 2019-04-22 DIAGNOSIS — I10 Essential (primary) hypertension: Secondary | ICD-10-CM | POA: Diagnosis not present

## 2019-04-22 DIAGNOSIS — C679 Malignant neoplasm of bladder, unspecified: Secondary | ICD-10-CM | POA: Diagnosis not present

## 2019-04-22 DIAGNOSIS — Z87442 Personal history of urinary calculi: Secondary | ICD-10-CM | POA: Insufficient documentation

## 2019-04-22 DIAGNOSIS — R310 Gross hematuria: Secondary | ICD-10-CM | POA: Insufficient documentation

## 2019-04-22 DIAGNOSIS — N200 Calculus of kidney: Secondary | ICD-10-CM | POA: Diagnosis not present

## 2019-04-22 DIAGNOSIS — C678 Malignant neoplasm of overlapping sites of bladder: Secondary | ICD-10-CM | POA: Diagnosis not present

## 2019-04-22 DIAGNOSIS — Z7982 Long term (current) use of aspirin: Secondary | ICD-10-CM | POA: Diagnosis not present

## 2019-04-22 DIAGNOSIS — D494 Neoplasm of unspecified behavior of bladder: Secondary | ICD-10-CM | POA: Diagnosis not present

## 2019-04-22 HISTORY — DX: Personal history of urinary calculi: Z87.442

## 2019-04-22 HISTORY — PX: TRANSURETHRAL RESECTION OF BLADDER TUMOR: SHX2575

## 2019-04-22 LAB — GLUCOSE, CAPILLARY
Glucose-Capillary: 144 mg/dL — ABNORMAL HIGH (ref 70–99)
Glucose-Capillary: 128 mg/dL — ABNORMAL HIGH (ref 70–99)

## 2019-04-22 SURGERY — TURBT (TRANSURETHRAL RESECTION OF BLADDER TUMOR)
Anesthesia: General | Site: Bladder

## 2019-04-22 MED ORDER — HYDROCODONE-ACETAMINOPHEN 5-325 MG PO TABS: 1.0000 | ORAL_TABLET | ORAL | 0 refills | Status: DC | PRN

## 2019-04-22 MED ORDER — OXYCODONE HCL 5 MG/5ML PO SOLN
5.0000 mg | Freq: Once | ORAL | Status: DC | PRN
  Filled 2019-04-22: qty 5

## 2019-04-22 MED ORDER — FENTANYL CITRATE (PF) 100 MCG/2ML IJ SOLN
INTRAMUSCULAR | Status: AC
  Filled 2019-04-22: qty 2

## 2019-04-22 MED ORDER — CIPROFLOXACIN IN D5W 400 MG/200ML IV SOLN
INTRAVENOUS | Status: AC
  Filled 2019-04-22: qty 200

## 2019-04-22 MED ORDER — CIPROFLOXACIN HCL 500 MG PO TABS: 500.0000 mg | ORAL_TABLET | Freq: Two times a day (BID) | ORAL | 0 refills | Status: AC

## 2019-04-22 MED ORDER — FENTANYL CITRATE (PF) 100 MCG/2ML IJ SOLN
25.0000 ug | INTRAMUSCULAR | Status: DC | PRN
  Administered 2019-04-22: 50 ug via INTRAVENOUS
  Administered 2019-04-22: 25 ug via INTRAVENOUS
  Filled 2019-04-22: qty 1

## 2019-04-22 MED ORDER — OXYBUTYNIN CHLORIDE 5 MG PO TABS
5.0000 mg | ORAL_TABLET | Freq: Once | ORAL | Status: AC
  Administered 2019-04-22: 5 mg via ORAL
  Filled 2019-04-22: qty 1

## 2019-04-22 MED ORDER — GEMCITABINE CHEMO FOR BLADDER INSTILLATION 2000 MG
2000.0000 mg | Freq: Once | INTRAVENOUS | Status: AC
  Administered 2019-04-22: 2000 mg via INTRAVESICAL
  Filled 2019-04-22: qty 2000

## 2019-04-22 MED ORDER — BELLADONNA ALKALOIDS-OPIUM 16.2-30 MG RE SUPP
RECTAL | Status: AC
  Filled 2019-04-22: qty 1

## 2019-04-22 MED ORDER — PHENAZOPYRIDINE HCL 200 MG PO TABS: 200.0000 mg | ORAL_TABLET | Freq: Three times a day (TID) | ORAL | 0 refills | Status: DC | PRN

## 2019-04-22 MED ORDER — ONDANSETRON HCL 4 MG/2ML IJ SOLN
INTRAMUSCULAR | Status: AC
  Filled 2019-04-22: qty 2

## 2019-04-22 MED ORDER — OXYBUTYNIN CHLORIDE 5 MG PO TABS: 5.0000 mg | ORAL_TABLET | Freq: Three times a day (TID) | ORAL | 1 refills | Status: DC | PRN

## 2019-04-22 MED ORDER — LACTATED RINGERS IV SOLN
INTRAVENOUS | Status: DC
  Administered 2019-04-22: 15:00:00 via INTRAVENOUS
  Administered 2019-04-22: 11:00:00 50 mL/h via INTRAVENOUS
  Filled 2019-04-22: qty 1000

## 2019-04-22 MED ORDER — SODIUM CHLORIDE 0.9 % IR SOLN
Status: DC | PRN
  Administered 2019-04-22 (×2): 6000 mL via INTRAVESICAL

## 2019-04-22 MED ORDER — LIDOCAINE HCL (CARDIAC) PF 100 MG/5ML IV SOSY
PREFILLED_SYRINGE | INTRAVENOUS | Status: DC | PRN
  Administered 2019-04-22: 60 mg via INTRAVENOUS

## 2019-04-22 MED ORDER — PROPOFOL 10 MG/ML IV BOLUS
INTRAVENOUS | Status: DC | PRN
  Administered 2019-04-22: 20 mg via INTRAVENOUS
  Administered 2019-04-22: 150 mg via INTRAVENOUS

## 2019-04-22 MED ORDER — CIPROFLOXACIN IN D5W 400 MG/200ML IV SOLN
400.0000 mg | Freq: Once | INTRAVENOUS | Status: AC
  Administered 2019-04-22: 400 mg via INTRAVENOUS
  Filled 2019-04-22: qty 200

## 2019-04-22 MED ORDER — BELLADONNA ALKALOIDS-OPIUM 16.2-60 MG RE SUPP
RECTAL | Status: DC | PRN
  Administered 2019-04-22: 1 via RECTAL

## 2019-04-22 MED ORDER — PROPOFOL 10 MG/ML IV BOLUS
INTRAVENOUS | Status: AC
  Filled 2019-04-22: qty 20

## 2019-04-22 MED ORDER — ONDANSETRON HCL 4 MG/2ML IJ SOLN
INTRAMUSCULAR | Status: DC | PRN
  Administered 2019-04-22: 4 mg via INTRAVENOUS

## 2019-04-22 MED ORDER — OXYBUTYNIN CHLORIDE 5 MG PO TABS
ORAL_TABLET | ORAL | Status: AC
  Filled 2019-04-22: qty 1

## 2019-04-22 MED ORDER — LIDOCAINE 2% (20 MG/ML) 5 ML SYRINGE
INTRAMUSCULAR | Status: AC
  Filled 2019-04-22: qty 5

## 2019-04-22 MED ORDER — DEXAMETHASONE SODIUM PHOSPHATE 10 MG/ML IJ SOLN
INTRAMUSCULAR | Status: DC | PRN
  Administered 2019-04-22: 8 mg via INTRAVENOUS

## 2019-04-22 MED ORDER — OXYCODONE HCL 5 MG PO TABS
5.0000 mg | ORAL_TABLET | Freq: Once | ORAL | Status: DC | PRN
  Filled 2019-04-22: qty 1

## 2019-04-22 MED ORDER — ONDANSETRON HCL 4 MG/2ML IJ SOLN
4.0000 mg | Freq: Once | INTRAMUSCULAR | Status: DC | PRN
  Filled 2019-04-22: qty 2

## 2019-04-22 MED ORDER — DEXAMETHASONE SODIUM PHOSPHATE 10 MG/ML IJ SOLN
INTRAMUSCULAR | Status: AC
  Filled 2019-04-22: qty 1

## 2019-04-22 MED ORDER — FENTANYL CITRATE (PF) 100 MCG/2ML IJ SOLN
INTRAMUSCULAR | Status: DC | PRN
  Administered 2019-04-22 (×2): 50 ug via INTRAVENOUS

## 2019-04-22 MED ORDER — IOHEXOL 300 MG/ML  SOLN
INTRAMUSCULAR | Status: DC | PRN
  Administered 2019-04-22: 40 mL via URETHRAL

## 2019-04-22 SURGICAL SUPPLY — 21 items
BAG DRAIN URO-CYSTO SKYTR STRL (DRAIN) ×3 IMPLANT
BAG DRN UROCATH (DRAIN) ×1
BAG URINE DRAINAGE (UROLOGICAL SUPPLIES) ×2 IMPLANT
BAG URINE LEG 500ML (DRAIN) IMPLANT
CATH FOLEY 2WAY SLVR  5CC 18FR (CATHETERS) ×2
CATH FOLEY 2WAY SLVR 5CC 18FR (CATHETERS) IMPLANT
GLOVE BIO SURGEON STRL SZ7.5 (GLOVE) ×3 IMPLANT
GOWN STRL REUS W/TWL XL LVL3 (GOWN DISPOSABLE) ×3 IMPLANT
GUIDEWIRE ASNIS 2.0 100 NS (WIRE) ×2 IMPLANT
HOLDER FOLEY CATH W/STRAP (MISCELLANEOUS) ×2 IMPLANT
IV NS IRRIG 3000ML ARTHROMATIC (IV SOLUTION) ×10 IMPLANT
LOOP CUT BIPOLAR 24F LRG (ELECTROSURGICAL) ×2 IMPLANT
MANIFOLD NEPTUNE II (INSTRUMENTS) ×3 IMPLANT
NS IRRIG 500ML POUR BTL (IV SOLUTION) IMPLANT
PACK CYSTO (CUSTOM PROCEDURE TRAY) ×3 IMPLANT
SYRINGE IRR TOOMEY STRL 70CC (SYRINGE) ×2 IMPLANT
TUBE CONNECTING 12'X1/4 (SUCTIONS) ×1
TUBE CONNECTING 12X1/4 (SUCTIONS) ×2 IMPLANT
TUBING UROLOGY SET (TUBING) ×2 IMPLANT
WATER STERILE IRR 3000ML UROMA (IV SOLUTION) IMPLANT
WATER STERILE IRR 500ML POUR (IV SOLUTION) IMPLANT

## 2019-04-22 NOTE — Op Note (Signed)
Operative Note  Preoperative diagnosis:  1.  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  Cystoscopy with TURBT of 2.5 cm papillary bladder tumor 2.  Cystogram with intraoperative interpretation of fluoroscopic imaging 3.  Left retrograde pyelogram with intraoperative interpretation of fluoroscopic imaging 4.  Intravesical instillation of gemcitabine  Surgeon: Ellison Hughs, MD  Assistants:  None  Anesthesia:  General  Complications:  None  EBL: 10 mL  Specimens: 1.  Superficial and deep margins of left bladder tumor  Drains/Catheters: 1.  18 French Foley catheter with 10 mL in the balloon  Intraoperative findings:   1. 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 Solitary left collecting system with no filling defects or dilation involving the left ureter or left renal pelvis seen on retrograde pyelogram 3. A cystogram was obtained that showed no evidence of extravasation of contrast with the bladder distended to approximately 300 mL.  There was no evidence of extravasation of contrast on post drainage imaging. 4.  There was a small amount of urethral bleeding around his Foley catheter at the conclusion of the case  Indication:  Craig Taylor is a 78 y.o. male with a history of gross hematuria.  He had a CT on 03/24/2019 that showed a suspicious bladder lesion concerning for neoplasm.  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  He has been consented for the above procedures, voices understanding and wishes to proceed.  Description of procedure:  After informed consent was obtained, the patient was brought to the operating room and general LMA anesthesia was  administered. The patient was then placed in the dorsolithotomy position and prepped and draped in the usual sterile fashion. A timeout was performed. A 26 French resectoscope with a bipolar loop working element was then inserted into the urethral meatus and advanced into the bladder.   A complete urethral and bladder survey revealed his previously noted papillary bladder mass that appeared to be extending onto the left ureteral orifice but not into it.  The papillary bladder was then first resected superficially with the resected tissue being sent as a separate specimen.  A deep muscular layer was then resected and sent as a separate specimen.  There was a papillary tumor extending onto the left ureteral orifice which required resection.  There did not appear to be any tumor emanating from the orifice following resection.  The area of resection was then extensively fulgurated until hemostasis was achieved.  A 5 French ureteral catheter was then inserted into the left ureteral orifice and a retrograde pyelogram was obtained, with the findings listed above.  A cystogram was also obtained, with the findings listed above.   An 68 French Foley catheter was then inserted in the bladder and irrigated. The irrigant returned clear to light pink. The patient was awakened and taken to the recovery room.  While in the recovery room 2000 mg of gemcitabine in 50 mL of water was instilled in the bladder through the catheter and the catheter was plugged. This will remain indwelling for approximately one hour. It will then be drained from the bladder and the catheter will be removed and the patient discharged home.   Plan: The patient has been instructed to remove his  Foley catheter at 6 AM on 04/25/2019.  He will follow-up in 2 weeks to discuss his pathology results.

## 2019-04-22 NOTE — Discharge Instructions (Signed)

## 2019-04-22 NOTE — Anesthesia Preprocedure Evaluation (Signed)
Anesthesia Evaluation  Patient identified by MRN, date of birth, ID band Patient awake    Reviewed: Allergy & Precautions, NPO status , Patient's Chart, lab work & pertinent test results, reviewed documented beta blocker date and time   History of Anesthesia Complications Negative for: history of anesthetic complications  Airway Mallampati: III  TM Distance: >3 FB Neck ROM: Full    Dental  (+) Edentulous Upper, Edentulous Lower   Pulmonary neg pulmonary ROS, former smoker,    Pulmonary exam normal        Cardiovascular Exercise Tolerance: Good hypertension, Pt. on medications and Pt. on home beta blockers + CAD, + Past MI and + Cardiac Stents  Normal cardiovascular exam     Neuro/Psych negative neurological ROS  negative psych ROS   GI/Hepatic negative GI ROS, Neg liver ROS,   Endo/Other  diabetes, Type 2, Oral Hypoglycemic Agents  Renal/GU Renal InsufficiencyRenal disease  negative genitourinary   Musculoskeletal negative musculoskeletal ROS (+)   Abdominal   Peds  Hematology negative hematology ROS (+)   Anesthesia Other Findings   Reproductive/Obstetrics                             Anesthesia Physical Anesthesia Plan  ASA: III  Anesthesia Plan: General   Post-op Pain Management:    Induction: Intravenous  PONV Risk Score and Plan: 3 and Ondansetron, Dexamethasone, Treatment may vary due to age or medical condition and Midazolam  Airway Management Planned: LMA  Additional Equipment: None  Intra-op Plan:   Post-operative Plan: Extubation in OR  Informed Consent: I have reviewed the patients History and Physical, chart, labs and discussed the procedure including the risks, benefits and alternatives for the proposed anesthesia with the patient or authorized representative who has indicated his/her understanding and acceptance.     Dental advisory given  Plan Discussed  with:   Anesthesia Plan Comments:         Anesthesia Quick Evaluation

## 2019-04-22 NOTE — H&P (Signed)
PRE-OP H&P  CC/HPI: CC: Hematuria   HPI: Mr. Craig Taylor is a 78 year old male with a history of kidney stones (required ESWL and URS). He comes to clinic today after an episodes of gross hematuria associated with left flank pain. He describes the pain as sharp, intermittent w/o radiation. He denies nausea/vomiting, fever, chills or dysuria.   -Previously smoked 2ppd for 20 years (quit 30+ years ago)  -No personal/family history of GU cancers  -takes a daily aspirin  -No prior history of UTIs  - From a urinary standpoint, he reports a fluctuating FOS but feels like he is emptying his bladder well. He has occasional urgency/frequency as well as PVD. Nocturia x 1-2. No prior BPH treatment.  -Works as part-time Administrator   [id number]: The patient is here today for a cystoscopy to complete his hematuria evaluation. CT urogram from 03/24/2019 showed a suspicious lesion in the leftward portion of his bladder, concerning for a neoplasm. He was also noted to have benign renal cysts and bilateral non-obstructing renal stones on CT. He denies interval episodes of dysuria or hematuria. No complaints today.     ALLERGIES: Penicillins    MEDICATIONS: Tamsulosin Hcl 0.4 mg capsule 1 capsule PO Daily  Aspirin 81 MG TABS Oral  Enalapril Maleate  Fish Oil CAPS Oral  Metformin Hcl 1,000 mg tablet Oral  Niaspan 1000 MG Oral Tablet Extended Release Oral  Nitrostat 0.4 mg tablet, sublingual Sublingual  Simvastatin 80 mg tablet Oral     GU PSH: Locm 300-399Mg /Ml Iodine,1Ml - 03/24/2019       PSH Notes: Cath Stent Placement   NON-GU PSH: No Non-GU PSH    GU PMH: Gross hematuria - 03/17/2019 BPH w/o LUTS, Benign prostatic hypertrophy without lower urinary tract symptoms - 2012/11/16 History of urolithiasis, Nephrolithiasis - 11/16/2012 Renal calculus, Nephrolithiasis - 11/16/12      PMH Notes:  1898-08-11 00:00:00 - Note: Normal Routine History And Physical Senior Citizen [phone])  2012-04-15 09:56:24 - Note: Acute  Myocardial Infarction   NON-GU PMH: Personal history of other diseases of the circulatory system, History of essential hypertension - 2012/11/16 Personal history of other endocrine, nutritional and metabolic disease, History of diabetes mellitus - 2012/11/16, History of hypercholesterolemia, - 11/16/2012    FAMILY HISTORY: Death In The Family Father - Runs In Family Death In The Family Mother - Runs In Family Family Health Status Number - Runs In Family   SOCIAL HISTORY: Marital Status: Widowed Preferred Language: English; Race: White Current Smoking Status: Patient has never smoked.   Tobacco Use Assessment Completed: Used Tobacco in last 30 days? Does not use smokeless tobacco. Has never drank.  Drinks 4+ caffeinated drinks per day. Has not had a blood transfusion.     Notes: Tobacco use, Occupation: Retired, Caffeine Use, Marital History - Currently Married, Alcohol Use   REVIEW OF SYSTEMS:    GU Review Male:   Patient reports get up at night to urinate. Patient denies frequent urination, hard to postpone urination, burning/ pain with urination, leakage of urine, stream starts and stops, trouble starting your stream, have to strain to urinate , erection problems, and penile pain.  Gastrointestinal (Upper):   Patient denies nausea, vomiting, and indigestion/ heartburn.  Gastrointestinal (Lower):   Patient denies diarrhea and constipation.  Constitutional:   Patient denies fever, night sweats, weight loss, and fatigue.  Skin:   Patient denies skin rash/ lesion and itching.  Eyes:   Patient denies blurred vision and double vision.  Ears/ Nose/  Throat:   Patient denies sore throat and sinus problems.  Hematologic/Lymphatic:   Patient denies swollen glands and easy bruising.  Cardiovascular:   Patient denies leg swelling and chest pains.  Respiratory:   Patient denies cough and shortness of breath.  Endocrine:   Patient denies excessive thirst.  Musculoskeletal:   Patient denies back pain and joint  pain.  Neurological:   Patient denies headaches and dizziness.  Psychologic:   Patient denies depression and anxiety.   Notes: pt gets up at night 3-4 times     VITAL SIGNS:      04/07/2019 07:51 AM  Weight 258 lb / 117.03 kg  Height 76 in / 193.04 cm  BP 148/78 mmHg  Heart Rate 74 /min  Temperature 98.4 F / 36.8 C  BMI 31.4 kg/m   GU PHYSICAL EXAMINATION:    Urethral Meatus: Normal size. No lesion, no wart, no discharge, no polyp. Normal location.  Penis: Penis uncircumcised. No foreskin warts, no cracks. No dorsal peyronie's plaques, no left corporal peyronie's plaques, no right corporal peyronie's plaques, no scarring, no shaft warts. No balanitis, no meatal stenosis.    MULTI-SYSTEM PHYSICAL EXAMINATION:    Constitutional: Well-nourished. No physical deformities. Normally developed. Good grooming.  Neck: Neck symmetrical, not swollen. Normal tracheal position.  Respiratory: No labored breathing, no use of accessory muscles.   Cardiovascular: Normal temperature, normal extremity pulses, no swelling, no varicosities.  Skin: No paleness, no jaundice, no cyanosis. No lesion, no ulcer, no rash.  Neurologic / Psychiatric: Oriented to time, oriented to place, oriented to person. No depression, no anxiety, no agitation.  Gastrointestinal: No mass, no tenderness, no rigidity, non obese abdomen.  Eyes: Normal conjunctivae. Normal eyelids.  Musculoskeletal: Normal gait and station of head and neck.     PAST DATA REVIEWED:  Source Of History:  Patient   03/17/19  PSA  Total PSA 2.10 ng/mL   Notes:                     CLINICAL DATA: Gross hematuria and microhematuria.   EXAM:  CT ABDOMEN AND PELVIS WITHOUT AND WITH CONTRAST   TECHNIQUE:  Multidetector CT imaging of the abdomen and pelvis was performed  following the standard protocol before and following the bolus  administration of intravenous contrast.   CONTRAST: 125 cc of Omnipaque 300   COMPARISON: 06/18/2018    FINDINGS:  Lower chest: No acute abnormality.   Hepatobiliary: Exophytic lesion arising from lateral segment of left  lobe of liver measures 2.7 cm, image 14/5. This is unchanged when  compared with the previous exam. On the early postcontrast images  there is peripheral enhancement within this structure. On the  delayed images there is continued internal enhancement compatible  with a benign hemangioma. No gallstones, gallbladder wall  thickening, or biliary dilatation.   Pancreas: Unremarkable. No pancreatic ductal dilatation or  surrounding inflammatory changes.   Spleen: Normal in size without focal abnormality.   Adrenals/Urinary Tract: Normal appearance of the adrenal glands.  Small bilateral renal calculi are identified. On the right the  largest stone is in the interpolar region measuring 3 mm. On the  left there are approximately 3 punctate stones within the mid and  upper pole measuring up to 3 mm.   Exophytic cyst arising from inferior pole of the right kidney  measures 2 cm. 1.3 by 1.6 cm right lower pole hyperdense kidney  lesion measures 36 HU on the postcontrast images this is similar to  the pre contrast images and is favored to represent a  hyperdense/hemorrhagic cyst. Similarly, there is a hyperdense lesion  arising from posterior cortex of the left kidney measuring 1.5 cm  without definite enhancement postcontrast.   No ureteral calculi identified. No hydroureter. No suspicious  filling defects within the collecting systems or ureters.   Suspicious enhancing lesion within the left side of urinary bladder  measures 2.5 cm and is worrisome for primary urothelial neoplasm,  image 91/9.   Stomach/Bowel: Stomach is normal. The small bowel loops have a  normal course and caliber without obstruction. No abnormal  dilatation of the colon.   Vascular/Lymphatic: Aortic atherosclerosis without aneurysm. No  abdominal adenopathy. No pelvic or inguinal adenopathy.    Reproductive: Prostate gland enlargement has mass effect upon the  bladder base.   Other: No free fluid or fluid collections.   Musculoskeletal: There is spondylosis identified within the lumbar  spine. No aggressive lytic or sclerotic bone lesions.   IMPRESSION:  1. Suspicious enhancing lesion within left side of the urinary  bladder is identified and worrisome for urothelial neoplasm. No  enlarged lymph nodes or findings to suggest distant metastatic  disease within the imaged portions of the abdomen and pelvis.  2. Bilateral Bosniak category 1 and 2 kidney lesions.  3. Prostate gland enlargement.  4. Small bilateral nonobstructing renal calculi.    Electronically Signed  By: Kerby Moors M.D.  On: 03/24/2019 11:09     PROCEDURES:         Flexible Cystoscopy - 52000  Risks, benefits, and some of the potential complications of the procedure were discussed at length with the patient including infection, bleeding, voiding discomfort, urinary retention, fever, chills, sepsis, and others. All questions were answered. Informed consent was obtained. Antibiotic prophylaxis was given. Sterile technique and intraurethral analgesia were used.  Meatus:  Normal size. Normal location. Normal condition.  Urethra:  No strictures.  External Sphincter:  Normal.  Verumontanum:  Normal.  Prostate:  Non-obstructing. No hyperplasia.  Bladder Neck:  Non-obstructing.  Ureteral Orifices:  Normal location. Normal size. Normal shape. Effluxed clear urine.  Bladder:  A left lateral wall tumor. 2 1/2 cm tumor. No trabeculation. Normal mucosa. No stones.      The lower urinary tract was carefully examined. The procedure was well-tolerated and without complications. Antibiotic instructions were given. Instructions were given to call the office immediately for bloody urine, difficulty urinating, urinary retention, painful or frequent urination, fever, chills, nausea, vomiting or other illness. The patient  stated that he understood these instructions and would comply with them.         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  2   Renal calculus - N20.0 Bilateral  3   Renal  cyst - N28.1 Bilateral   PLAN:           Schedule Return Visit/Planned Activity: ASAP - Schedule Surgery          Document Letter(s):  Created for Patient: Clinical Summary         Notes:   -The risks, benefits and alternatives of cystoscopy with TURBT with intravesical gemcitabine instillation was discussed with the patient. The risks include, but are not limited to, bleeding, urinary tract infection, bladder perforation requiring prolonged catheterization and/or open bladder repair, ureteral obstruction, voiding dysfunction and the inherent risks of general anesthesia. The patient voices understanding and wishes to proceed.

## 2019-04-22 NOTE — Transfer of Care (Signed)
Immediate Anesthesia Transfer of Care Note  Patient: [name]  Procedure(s) Performed: TRANSURETHRAL RESECTION OF BLADDER TUMOR (TURBT)/ CYSTOSCOPY BILATERAL RETROGRADE PYELOGRAMS,  GEMCITABINE INSTILLATION (N/A Bladder)  Patient Location: PACU  Anesthesia Type:General  Level of Consciousness: drowsy and patient cooperative  Airway & Oxygen Therapy: Patient Spontanous Breathing and Patient connected to nasal cannula oxygen  Post-op Assessment: Report given to RN and Post -op Vital signs reviewed and stable  Post vital signs: Reviewed and stable  Last Vitals:  Vitals Value Taken Time  BP 145/87 04/22/19 1340  Temp    Pulse 66 04/22/19 1343  Resp 14 04/22/19 1343  SpO2 95 % 04/22/19 1343  Vitals shown include unvalidated device data.  Last Pain:  Vitals:   04/22/19 1101  PainSc: 4       Patients Stated Pain Goal: 4 ([id number])  Complications: No apparent anesthesia complications

## 2019-04-22 NOTE — Anesthesia Procedure Notes (Signed)
Procedure Name: LMA Insertion Date/Time: 04/22/2019 12:37 PM Performed by: Raenette Rover, CRNA Pre-anesthesia Checklist: Patient identified, Emergency Drugs available, Suction available and Patient being monitored Patient Re-evaluated:Patient Re-evaluated prior to induction Oxygen Delivery Method: Circle system utilized Preoxygenation: Pre-oxygenation with 100% oxygen Induction Type: IV induction LMA: LMA inserted LMA Size: 5.0 Number of attempts: 1 Placement Confirmation: positive ETCO2 and breath sounds checked- equal and bilateral Tube secured with: Tape Dental Injury: Teeth and Oropharynx as per pre-operative assessment

## 2019-04-25 ENCOUNTER — Encounter (HOSPITAL_BASED_OUTPATIENT_CLINIC_OR_DEPARTMENT_OTHER): Payer: Self-pay | Admitting: Urology

## 2019-04-26 DIAGNOSIS — D414 Neoplasm of uncertain behavior of bladder: Secondary | ICD-10-CM | POA: Diagnosis not present

## 2019-04-26 DIAGNOSIS — R338 Other retention of urine: Secondary | ICD-10-CM | POA: Diagnosis not present

## 2019-05-02 NOTE — Anesthesia Postprocedure Evaluation (Signed)
Anesthesia Post Note  Patient: Craig Taylor  Procedure(s) Performed: TRANSURETHRAL RESECTION OF BLADDER TUMOR (TURBT)/ CYSTOSCOPY BILATERAL RETROGRADE PYELOGRAMS,  GEMCITABINE INSTILLATION (N/A Bladder)     Patient location during evaluation: PACU Anesthesia Type: General Level of consciousness: awake and alert Pain management: pain level controlled Vital Signs Assessment: post-procedure vital signs reviewed and stable Respiratory status: spontaneous breathing, nonlabored ventilation and respiratory function stable Cardiovascular status: blood pressure returned to baseline and stable Postop Assessment: no apparent nausea or vomiting Anesthetic complications: no    Last Vitals:  Vitals:   04/22/19 1515 04/22/19 1543  BP: (!) 173/77 117/73  Pulse: (!) 55 (!) 54  Resp: 11 12  Temp:  36.7 C  SpO2: 97% 96%    Last Pain:  Vitals:   04/25/19 1029  PainSc: 0-No pain                 Lidia Collum

## 2019-05-04 DIAGNOSIS — E6609 Other obesity due to excess calories: Secondary | ICD-10-CM | POA: Diagnosis not present

## 2019-05-04 DIAGNOSIS — E119 Type 2 diabetes mellitus without complications: Secondary | ICD-10-CM | POA: Diagnosis not present

## 2019-05-04 DIAGNOSIS — E7849 Other hyperlipidemia: Secondary | ICD-10-CM | POA: Diagnosis not present

## 2019-05-04 DIAGNOSIS — Z6832 Body mass index (BMI) 32.0-32.9, adult: Secondary | ICD-10-CM | POA: Diagnosis not present

## 2019-05-04 DIAGNOSIS — I10 Essential (primary) hypertension: Secondary | ICD-10-CM | POA: Diagnosis not present

## 2019-05-06 DIAGNOSIS — N401 Enlarged prostate with lower urinary tract symptoms: Secondary | ICD-10-CM | POA: Diagnosis not present

## 2019-05-06 DIAGNOSIS — C672 Malignant neoplasm of lateral wall of bladder: Secondary | ICD-10-CM | POA: Diagnosis not present

## 2019-05-06 DIAGNOSIS — R338 Other retention of urine: Secondary | ICD-10-CM | POA: Diagnosis not present

## 2019-05-16 DIAGNOSIS — J301 Allergic rhinitis due to pollen: Secondary | ICD-10-CM | POA: Diagnosis not present

## 2019-06-03 DIAGNOSIS — C672 Malignant neoplasm of lateral wall of bladder: Secondary | ICD-10-CM | POA: Diagnosis not present

## 2019-06-03 DIAGNOSIS — Z5111 Encounter for antineoplastic chemotherapy: Secondary | ICD-10-CM | POA: Diagnosis not present

## 2019-06-10 DIAGNOSIS — Z5111 Encounter for antineoplastic chemotherapy: Secondary | ICD-10-CM | POA: Diagnosis not present

## 2019-06-10 DIAGNOSIS — C672 Malignant neoplasm of lateral wall of bladder: Secondary | ICD-10-CM | POA: Diagnosis not present

## 2019-06-17 DIAGNOSIS — C672 Malignant neoplasm of lateral wall of bladder: Secondary | ICD-10-CM | POA: Diagnosis not present

## 2019-06-17 DIAGNOSIS — Z5111 Encounter for antineoplastic chemotherapy: Secondary | ICD-10-CM | POA: Diagnosis not present

## 2019-06-24 DIAGNOSIS — Z5111 Encounter for antineoplastic chemotherapy: Secondary | ICD-10-CM | POA: Diagnosis not present

## 2019-06-24 DIAGNOSIS — C672 Malignant neoplasm of lateral wall of bladder: Secondary | ICD-10-CM | POA: Diagnosis not present

## 2019-07-01 DIAGNOSIS — Z5111 Encounter for antineoplastic chemotherapy: Secondary | ICD-10-CM | POA: Diagnosis not present

## 2019-07-01 DIAGNOSIS — C672 Malignant neoplasm of lateral wall of bladder: Secondary | ICD-10-CM | POA: Diagnosis not present

## 2019-07-14 DIAGNOSIS — Z5111 Encounter for antineoplastic chemotherapy: Secondary | ICD-10-CM | POA: Diagnosis not present

## 2019-07-14 DIAGNOSIS — C672 Malignant neoplasm of lateral wall of bladder: Secondary | ICD-10-CM | POA: Diagnosis not present

## 2019-07-15 DIAGNOSIS — H401121 Primary open-angle glaucoma, left eye, mild stage: Secondary | ICD-10-CM | POA: Diagnosis not present

## 2019-07-15 DIAGNOSIS — H2513 Age-related nuclear cataract, bilateral: Secondary | ICD-10-CM | POA: Diagnosis not present

## 2019-07-15 DIAGNOSIS — H401113 Primary open-angle glaucoma, right eye, severe stage: Secondary | ICD-10-CM | POA: Diagnosis not present

## 2019-07-19 DIAGNOSIS — Z6832 Body mass index (BMI) 32.0-32.9, adult: Secondary | ICD-10-CM | POA: Diagnosis not present

## 2019-07-19 DIAGNOSIS — M109 Gout, unspecified: Secondary | ICD-10-CM | POA: Diagnosis not present

## 2019-07-19 DIAGNOSIS — E6609 Other obesity due to excess calories: Secondary | ICD-10-CM | POA: Diagnosis not present

## 2019-08-18 ENCOUNTER — Encounter: Payer: Self-pay | Admitting: Cardiovascular Disease

## 2019-08-18 ENCOUNTER — Telehealth (INDEPENDENT_AMBULATORY_CARE_PROVIDER_SITE_OTHER): Payer: PPO | Admitting: Cardiovascular Disease

## 2019-08-18 VITALS — Ht 78.0 in | Wt 258.0 lb

## 2019-08-18 DIAGNOSIS — E785 Hyperlipidemia, unspecified: Secondary | ICD-10-CM

## 2019-08-18 DIAGNOSIS — I25118 Atherosclerotic heart disease of native coronary artery with other forms of angina pectoris: Secondary | ICD-10-CM | POA: Diagnosis not present

## 2019-08-18 DIAGNOSIS — E119 Type 2 diabetes mellitus without complications: Secondary | ICD-10-CM

## 2019-08-18 DIAGNOSIS — I10 Essential (primary) hypertension: Secondary | ICD-10-CM

## 2019-08-18 MED ORDER — NITROGLYCERIN 0.4 MG SL SUBL: 0.4000 mg | SUBLINGUAL_TABLET | SUBLINGUAL | 3 refills | Status: DC | PRN

## 2019-08-18 MED ORDER — SIMVASTATIN 40 MG PO TABS: 40.0000 mg | ORAL_TABLET | Freq: Every day | ORAL | Status: DC

## 2019-08-18 NOTE — Addendum Note (Signed)
Addended by: Laurine Blazer on: 08/18/2019 04:43 PM   Modules accepted: Orders

## 2019-08-18 NOTE — Progress Notes (Signed)
Virtual Visit via Telephone Note   This visit type was conducted due to national recommendations for restrictions regarding the COVID-19 Pandemic (e.g. social distancing) in an effort to limit this patient's exposure and mitigate transmission in our community.  Due to his co-morbid illnesses, this patient is at least at moderate risk for complications without adequate follow up.  This format is felt to be most appropriate for this patient at this time.  The patient did not have access to video technology/had technical difficulties with video requiring transitioning to audio format only (telephone).  All issues noted in this document were discussed and addressed.  No physical exam could be performed with this format.  Please refer to the patient's chart for his  consent to telehealth for [facility].   Date:  08/18/2019   ID:  Craig Taylor, DOB 1941/03/03, MRN [id number]  Patient Location: Home Provider Location: Office  PCP:  Redmond School, MD  Cardiologist:  Kate Sable, MD  Electrophysiologist:  None   Evaluation Performed:  Follow-Up Visit  Chief Complaint: Coronary artery disease  History of Present Illness:    Craig Taylor is a 79 y.o. male with coronary artery disease. He underwent RCA stenting in 2003 and also has hypertension, hyperlipidemia, and diabetes mellitus.  The patient denies any symptoms of chest pain, palpitations, shortness of breath, lightheadedness, dizziness, leg swelling, orthopnea, PND, and syncope.  He is currently chopping wood.  The patient does not have symptoms concerning for COVID-19 infection (fever, chills, cough, or new shortness of breath).    Past Medical History:  Diagnosis Date   Cancer [facility])    Chronic renal insufficiency    Coronary artery disease    History of kidney stones    Hyperlipidemia    Hypertension    Myocardial infarction [facility]) 2005   stents   Nephrolithiasis    Non-insulin dependent type 2  diabetes mellitus ([place])    Past Surgical History:  Procedure Laterality Date   BIOPSY  07/13/2018   Procedure: BIOPSY;  Surgeon: Daneil Dolin, MD;  [facility];  Service: Endoscopy;;  (gastric)   CARDIAC CATHETERIZATION  08/03/2002   No intervention   CARDIAC CATHETERIZATION  08/05/2002   Crux of RCA 95% stenosis, stented with a 2.5x34mm CYPHER stent resulting in reduction of 95% stenosis to 0% residual; overlapping CYPHER stents -3x21mm and 3x15mm- deployed at 15atm resulting in reduction of 70% stenosis to 0% residual.   CARDIAC CATHETERIZATION  10/04/2002   No intervention   CARDIOVASCULAR STRESS TEST  09/07/2009   Perfusion defect seen in inferior region consistent with diaphragmatic attenuation. Remaining myocardium demonstrates normal myocardial perfusion with no evidence of ischemia or infarct   CAROTID DOPPLER  09/07/2009   No evidence of bilateral diameter reduction, significant tortuosity, or any other vascular abnormality   Colonoscopy     COLONOSCOPY N/A 05/31/2014   Dr. Gala Romney: Normal   COLONOSCOPY N/A 07/13/2018   Procedure: COLONOSCOPY;  Surgeon: Daneil Dolin, MD;  [facility];  Service: Endoscopy;  Laterality: N/A;  8:30am   ESOPHAGOGASTRODUODENOSCOPY N/A 07/13/2018   Procedure: ESOPHAGOGASTRODUODENOSCOPY (EGD);  Surgeon: Daneil Dolin, MD;  [facility];  Service: Endoscopy;  Laterality: N/A;   TRANSTHORACIC ECHOCARDIOGRAM  03/30/2006   EF 50-55%, LA moderate-severely dilated,    TRANSURETHRAL RESECTION OF BLADDER TUMOR N/A 04/22/2019   Procedure: TRANSURETHRAL RESECTION OF BLADDER TUMOR (TURBT)/ CYSTOSCOPY BILATERAL RETROGRADE PYELOGRAMS,  GEMCITABINE INSTILLATION;  Surgeon: Ceasar Mons, MD;  Location:  [place];  Service: Urology;  Laterality: N/A;  ONLY NEEDS 45 MIN     Current Meds  Medication Sig   aspirin EC 81 MG tablet Take 81 mg by mouth daily.   Cholecalciferol (VITAMIN D-3) 125  MCG (5000 UT) TABS Take 1 tablet by mouth daily.   Cinnamon 500 MG TABS Take by mouth 2 (two) times daily.   enalapril (VASOTEC) 2.5 MG tablet Take 2.5 mg by mouth daily.   metFORMIN (GLUCOPHAGE) 500 MG tablet Take 500 mg by mouth 2 (two) times daily with a meal.   metoprolol tartrate (LOPRESSOR) 50 MG tablet Take 1 tablet by mouth twice daily   nitroGLYCERIN (NITROSTAT) 0.4 MG SL tablet Place 1 tablet (0.4 mg total) under the tongue every 5 (five) minutes as needed for chest pain.   Omega-3 Fatty Acids (FISH OIL PO) Take by mouth daily.    ROCKLATAN 0.02-0.005 % SOLN Place 1 drop into the right eye 2 (two) times a day.    SODIUM BICARBONATE PO Take 1 tablet by mouth at bedtime. 10gr tab   tamsulosin (FLOMAX) 0.4 MG CAPS capsule Take 0.4 mg by mouth daily.     Allergies:   Lipitor [atorvastatin], Penicillins, and Voltaren [diclofenac sodium]   Social History   Tobacco Use   Smoking status: Former Smoker    Packs/day: 2.00    Years: 21.00    Pack years: 42.00    Types: Cigarettes    Start date: 07/04/1957    Quit date: 08/09/1983    Years since quitting: 36.0   Smokeless tobacco: Former Systems developer    Quit date: 08/11/2012   Tobacco comment: quit chewing tobacco aug 2014  Substance Use Topics   Alcohol use: No    Alcohol/week: 0.0 standard drinks   Drug use: No     Family Hx: The patient's family history includes Diabetes in his mother; Stroke in his mother. There is no history of Colon cancer.  ROS:   Please see the history of present illness.     All other systems reviewed and are negative.   Prior CV studies:   The following studies were reviewed today:  Stress test 05/11/18:   Blood pressure demonstrated a hypertensive response to exercise.  There was no ST segment deviation noted during stress.  Defect 1: There is a medium defect of moderate severity present in the mid inferoseptal, mid inferior and apical inferior location. This is consistent with  myocardial scar as well as overlying soft tissue attenuation. There are no significant ischemic territories.  This is a low risk study.  Nuclear stress EF: 55%.  Labs/Other Tests and Data Reviewed:    EKG:  No ECG reviewed.  Recent Labs: 03/15/2019: Hemoglobin 12.4; Platelets 265   Recent Lipid Panel Lab Results  Component Value Date/Time   CHOL 132 03/07/2014 10:51 AM   TRIG 129.0 03/07/2014 10:51 AM   HDL 25.20 (L) 03/07/2014 10:51 AM   CHOLHDL 5 03/07/2014 10:51 AM   LDLCALC 81 03/07/2014 10:51 AM    Wt Readings from Last 3 Encounters:  08/18/19 258 lb (117 kg)  04/22/19 246 lb 1 oz (111.6 kg)  03/10/19 255 lb 12.8 oz (116 kg)     Objective:    Vital Signs:  Ht 6\' 6"  (1.981 m)    Wt 258 lb (117 kg)    BMI 29.81 kg/m    VITAL SIGNS:  reviewed  ASSESSMENT & PLAN:    1. [id number] ischemic heart disease. Continue aspirin,  metoprolol and simvastatin.Low risk stress test in October 2019.  2. Essential HTN:No changes.  3. Hyperlipidemia: Continue simvastatin 40 mg.  4. DM2: On metformin and statin   COVID-19 Education: The signs and symptoms of COVID-19 were discussed with the patient and how to seek care for testing (follow up with PCP or arrange E-visit).  The importance of social distancing was discussed today.  Time:   Today, I have spent 5 minutes with the patient with telehealth technology discussing the above problems.     Medication Adjustments/Labs and Tests Ordered: Current medicines are reviewed at length with the patient today.  Concerns regarding medicines are outlined above.   Tests Ordered: No orders of the defined types were placed in this encounter.   Medication Changes: No orders of the defined types were placed in this encounter.   Follow Up:  Virtual Visit  in 6 month(s)  Signed, Kate Sable, MD  08/18/2019 3:08 PM    [facility]

## 2019-08-18 NOTE — Patient Instructions (Addendum)
Medication Instructions:   Simvastatin 40mg  daily - added to medication list.   Continue all other current medications.  Labwork: none  Testing/Procedures: none  Follow-Up: Your physician wants you to follow up in: 6 months.  You will receive a reminder letter in the mail one-two months in advance.  If you don't receive a letter, please call our office to schedule the follow up appointment   Any Other Special Instructions Will Be Listed Below (If Applicable).  If you need a refill on your cardiac medications before your next appointment, please call your pharmacy.

## 2019-08-25 DIAGNOSIS — C672 Malignant neoplasm of lateral wall of bladder: Secondary | ICD-10-CM | POA: Diagnosis not present

## 2019-08-25 DIAGNOSIS — N401 Enlarged prostate with lower urinary tract symptoms: Secondary | ICD-10-CM | POA: Diagnosis not present

## 2019-08-25 DIAGNOSIS — R351 Nocturia: Secondary | ICD-10-CM | POA: Diagnosis not present

## 2019-09-15 ENCOUNTER — Ambulatory Visit: Payer: PPO | Admitting: Gastroenterology

## 2019-09-26 ENCOUNTER — Other Ambulatory Visit: Payer: Self-pay | Admitting: Cardiovascular Disease

## 2019-10-24 ENCOUNTER — Other Ambulatory Visit: Payer: Self-pay | Admitting: Cardiovascular Disease

## 2019-11-07 ENCOUNTER — Telehealth: Payer: Self-pay | Admitting: Cardiovascular Disease

## 2019-11-07 MED ORDER — SIMVASTATIN 40 MG PO TABS: ORAL_TABLET | ORAL | 2 refills | Status: DC

## 2019-11-07 MED ORDER — METOPROLOL TARTRATE 50 MG PO TABS: 50.0000 mg | ORAL_TABLET | Freq: Two times a day (BID) | ORAL | 1 refills | Status: DC

## 2019-11-07 NOTE — Telephone Encounter (Signed)
[facility] called requesting the following medications be sent to [facility], [place], [place]  Phone # [phone] Metoprolol  & Zocor.  Holly with [facility] requested a call to verify medications were called in . [phone]

## 2019-11-07 NOTE — Telephone Encounter (Signed)
Medications sent to [facility].

## 2019-11-09 DIAGNOSIS — E1122 Type 2 diabetes mellitus with diabetic chronic kidney disease: Secondary | ICD-10-CM | POA: Diagnosis not present

## 2019-11-09 DIAGNOSIS — I2510 Atherosclerotic heart disease of native coronary artery without angina pectoris: Secondary | ICD-10-CM | POA: Diagnosis not present

## 2019-11-09 DIAGNOSIS — N1832 Chronic kidney disease, stage 3b: Secondary | ICD-10-CM | POA: Diagnosis not present

## 2019-11-09 DIAGNOSIS — I129 Hypertensive chronic kidney disease with stage 1 through stage 4 chronic kidney disease, or unspecified chronic kidney disease: Secondary | ICD-10-CM | POA: Diagnosis not present

## 2019-11-18 DIAGNOSIS — H2513 Age-related nuclear cataract, bilateral: Secondary | ICD-10-CM | POA: Diagnosis not present

## 2019-11-18 DIAGNOSIS — H401121 Primary open-angle glaucoma, left eye, mild stage: Secondary | ICD-10-CM | POA: Diagnosis not present

## 2019-11-18 DIAGNOSIS — H401113 Primary open-angle glaucoma, right eye, severe stage: Secondary | ICD-10-CM | POA: Diagnosis not present

## 2019-11-18 DIAGNOSIS — H1013 Acute atopic conjunctivitis, bilateral: Secondary | ICD-10-CM | POA: Diagnosis not present

## 2019-11-24 DIAGNOSIS — L814 Other melanin hyperpigmentation: Secondary | ICD-10-CM | POA: Diagnosis not present

## 2019-11-24 DIAGNOSIS — D2372 Other benign neoplasm of skin of left lower limb, including hip: Secondary | ICD-10-CM | POA: Diagnosis not present

## 2019-11-24 DIAGNOSIS — L905 Scar conditions and fibrosis of skin: Secondary | ICD-10-CM | POA: Diagnosis not present

## 2019-11-24 DIAGNOSIS — Z85828 Personal history of other malignant neoplasm of skin: Secondary | ICD-10-CM | POA: Diagnosis not present

## 2019-11-24 DIAGNOSIS — D1801 Hemangioma of skin and subcutaneous tissue: Secondary | ICD-10-CM | POA: Diagnosis not present

## 2019-11-24 DIAGNOSIS — R351 Nocturia: Secondary | ICD-10-CM | POA: Diagnosis not present

## 2019-11-24 DIAGNOSIS — L570 Actinic keratosis: Secondary | ICD-10-CM | POA: Diagnosis not present

## 2019-11-24 DIAGNOSIS — C671 Malignant neoplasm of dome of bladder: Secondary | ICD-10-CM | POA: Diagnosis not present

## 2019-11-24 DIAGNOSIS — L821 Other seborrheic keratosis: Secondary | ICD-10-CM | POA: Diagnosis not present

## 2019-11-24 DIAGNOSIS — N528 Other male erectile dysfunction: Secondary | ICD-10-CM | POA: Diagnosis not present

## 2019-11-24 DIAGNOSIS — L820 Inflamed seborrheic keratosis: Secondary | ICD-10-CM | POA: Diagnosis not present

## 2019-11-24 DIAGNOSIS — N401 Enlarged prostate with lower urinary tract symptoms: Secondary | ICD-10-CM | POA: Diagnosis not present

## 2019-11-25 ENCOUNTER — Other Ambulatory Visit: Payer: Self-pay | Admitting: Urology

## 2019-12-09 DIAGNOSIS — I129 Hypertensive chronic kidney disease with stage 1 through stage 4 chronic kidney disease, or unspecified chronic kidney disease: Secondary | ICD-10-CM | POA: Diagnosis not present

## 2019-12-09 DIAGNOSIS — I2510 Atherosclerotic heart disease of native coronary artery without angina pectoris: Secondary | ICD-10-CM | POA: Diagnosis not present

## 2019-12-09 DIAGNOSIS — E1122 Type 2 diabetes mellitus with diabetic chronic kidney disease: Secondary | ICD-10-CM | POA: Diagnosis not present

## 2019-12-09 DIAGNOSIS — N1832 Chronic kidney disease, stage 3b: Secondary | ICD-10-CM | POA: Diagnosis not present

## 2019-12-14 ENCOUNTER — Encounter (HOSPITAL_BASED_OUTPATIENT_CLINIC_OR_DEPARTMENT_OTHER): Payer: Self-pay | Admitting: Urology

## 2019-12-15 ENCOUNTER — Other Ambulatory Visit: Payer: Self-pay

## 2019-12-15 ENCOUNTER — Encounter (HOSPITAL_BASED_OUTPATIENT_CLINIC_OR_DEPARTMENT_OTHER): Payer: Self-pay | Admitting: Urology

## 2019-12-15 NOTE — Progress Notes (Addendum)
Spoke w/ via phone for pre-op interview--- PT Lab needs dos----  Istat and EKG             Lab results------ no COVID test ------ 12-17-2019@ 1005 Arrive at ------- 1100 NPO after ------ MN w/ exception clear liquids until 0700 then nothing by mouth (no cream/ milk products) Medications to take morning of surgery ----- Flomax, Lopressor w/ sips of water Diabetic medication ----- do not take metformin morning of surgery Patient Special Instructions ----- n/a Pre-Op special Istructions ----- n/a Patient verbalized understanding of instructions that were given at this phone interview. Patient denies shortness of breath, chest pain, fever, cough a this phone interview.   Anesthesia Review:   Hx MI/ CAD s/p PCI with DES to RCA and PDA.  HTN, DM2, CKD 3.  Pt denies any cardiac s&s and stated has never taken nitro since having prescription in 2003.  Chart to be reviewed by Konrad Felix PA.  PCP: Dr Redmond School (per pt lov 01/ 2021) Cardiologist : Dr Bronson Ing (lov 08-18-2019 epic) Chest x-ray : 02-02*2016 epic EKG : 03-12-2017 epic Echo : 03-30-2006  Epic Stress test:  Nuclear 05-11-2018 epic Cardiac Cath : last one 10-04-2002 epic Sleep Study/ CPAP :  NO Fasting Blood Sugar :  120 - 140    / Checks Blood Sugar -- times a day:  Checks 3 times per week Blood Thinner/ Instructions Maryjane Hurter Dose: NO ASA / Instructions/ Last Dose : ASA 81mg /  Per pt stated was given instructions by dr winter office to stop week before surgery/  Stated last dose 12-14-2019

## 2019-12-16 ENCOUNTER — Encounter (HOSPITAL_COMMUNITY): Payer: Self-pay | Admitting: Physician Assistant

## 2019-12-16 DIAGNOSIS — U071 COVID-19: Secondary | ICD-10-CM

## 2019-12-16 HISTORY — DX: COVID-19: U07.1

## 2019-12-16 NOTE — Progress Notes (Signed)
Spoke with Janett Billow zanetto pa ok to proceed.

## 2019-12-17 ENCOUNTER — Other Ambulatory Visit (HOSPITAL_COMMUNITY)
Admission: RE | Admit: 2019-12-17 | Discharge: 2019-12-17 | Disposition: A | Discharge status: Home / Self Care | Payer: PPO | Source: Ambulatory Visit | Attending: Urology | Admitting: Urology

## 2019-12-17 DIAGNOSIS — U071 COVID-19: Secondary | ICD-10-CM | POA: Diagnosis not present

## 2019-12-17 DIAGNOSIS — Z01812 Encounter for preprocedural laboratory examination: Secondary | ICD-10-CM | POA: Insufficient documentation

## 2019-12-18 LAB — SARS CORONAVIRUS 2 (TAT 6-24 HRS): SARS Coronavirus 2: POSITIVE — AB

## 2019-12-18 NOTE — Progress Notes (Signed)
Spoke with Dr. Louis Meckel at [facility] regarding pt's positive covid test.  Jacqlyn Larsen, RN

## 2019-12-19 ENCOUNTER — Other Ambulatory Visit: Payer: Self-pay | Admitting: Physician Assistant

## 2019-12-19 DIAGNOSIS — I10 Essential (primary) hypertension: Secondary | ICD-10-CM

## 2019-12-19 DIAGNOSIS — U071 COVID-19: Secondary | ICD-10-CM

## 2019-12-19 DIAGNOSIS — E119 Type 2 diabetes mellitus without complications: Secondary | ICD-10-CM

## 2019-12-19 DIAGNOSIS — I25118 Atherosclerotic heart disease of native coronary artery with other forms of angina pectoris: Secondary | ICD-10-CM

## 2019-12-19 NOTE — Progress Notes (Signed)
Spoke with Beola Cord at [facility] and made aware covid positive test on 12-17-2019, Marlowe Kays to make dr winter aware.

## 2019-12-19 NOTE — Progress Notes (Signed)
  I connected by phone with Craig Taylor on 12/19/2019 at 7:43 AM to discuss the potential use of an new treatment for mild to moderate COVID-19 viral infection in non-hospitalized patients.  This patient is a 79 y.o. male that meets the FDA criteria for Emergency Use Authorization of bamlanivimab/etesevimab or casirivimab/imdevimab.  Has a (+) direct SARS-CoV-2 viral test result  Has mild or moderate COVID-19   Is ? 79 years of age and weighs ? 40 kg  Is NOT hospitalized due to COVID-19  Is NOT requiring oxygen therapy or requiring an increase in baseline oxygen flow rate due to COVID-19  Is within 10 days of symptom onset  Has at least one of the high risk factor(s) for progression to severe COVID-19 and/or hospitalization as defined in EUA.  Specific high risk criteria : >/= 78 yo, HTN, CVD, cancer   I have spoken and communicated the following to the patient or parent/caregiver:  1. FDA has authorized the emergency use of bamlanivimab/etesevimab and casirivimab\imdevimab for the treatment of mild to moderate COVID-19 in adults and pediatric patients with positive results of direct SARS-CoV-2 viral testing who are 73 years of age and older weighing at least 40 kg, and who are at high risk for progressing to severe COVID-19 and/or hospitalization.  2. The significant known and potential risks and benefits of bamlanivimab/etesevimab and casirivimab\imdevimab, and the extent to which such potential risks and benefits are unknown.  3. Information on available alternative treatments and the risks and benefits of those alternatives, including clinical trials.  4. Patients treated with bamlanivimab/etesevimab and casirivimab\imdevimab should continue to self-isolate and use infection control measures (e.g., wear mask, isolate, social distance, avoid sharing personal items, clean and disinfect "high touch" surfaces, and frequent handwashing) according to CDC guidelines.   5. The patient  or parent/caregiver has the option to accept or refuse bamlanivimab/etesevimab or casirivimab\imdevimab .  After reviewing this information with the patient, The patient agreed to proceed with receiving the bamlanivimab/etesevimab infusion and will be provided a copy of the Fact sheet prior to receiving the infusion.   Sx onset 5/3. Set up for infusion today at 12:30pm. Directions given.  Angelena Form 12/19/2019 7:43 AM

## 2019-12-20 ENCOUNTER — Ambulatory Visit (HOSPITAL_COMMUNITY)
Admission: RE | Admit: 2019-12-20 | Discharge: 2019-12-20 | Disposition: A | Discharge status: Home / Self Care | Payer: Medicare Other | Source: Ambulatory Visit | Attending: Pulmonary Disease | Admitting: Pulmonary Disease

## 2019-12-20 DIAGNOSIS — E119 Type 2 diabetes mellitus without complications: Secondary | ICD-10-CM | POA: Diagnosis not present

## 2019-12-20 DIAGNOSIS — I10 Essential (primary) hypertension: Secondary | ICD-10-CM | POA: Insufficient documentation

## 2019-12-20 DIAGNOSIS — I25118 Atherosclerotic heart disease of native coronary artery with other forms of angina pectoris: Secondary | ICD-10-CM | POA: Insufficient documentation

## 2019-12-20 DIAGNOSIS — U071 COVID-19: Secondary | ICD-10-CM | POA: Diagnosis present

## 2019-12-20 DIAGNOSIS — Z23 Encounter for immunization: Secondary | ICD-10-CM | POA: Diagnosis not present

## 2019-12-20 MED ORDER — SODIUM CHLORIDE 0.9 % IV SOLN: INTRAVENOUS | Status: DC | PRN

## 2019-12-20 MED ORDER — ALBUTEROL SULFATE HFA 108 (90 BASE) MCG/ACT IN AERS: 2.0000 | INHALATION_SPRAY | Freq: Once | RESPIRATORY_TRACT | Status: DC | PRN

## 2019-12-20 MED ORDER — EPINEPHRINE 0.3 MG/0.3ML IJ SOAJ: 0.3000 mg | Freq: Once | INTRAMUSCULAR | Status: DC | PRN

## 2019-12-20 MED ORDER — SODIUM CHLORIDE 0.9 % IV SOLN
Freq: Once | INTRAVENOUS | Status: AC
  Filled 2019-12-20: qty 20

## 2019-12-20 MED ORDER — METHYLPREDNISOLONE SODIUM SUCC 125 MG IJ SOLR: 125.0000 mg | Freq: Once | INTRAMUSCULAR | Status: DC | PRN

## 2019-12-20 MED ORDER — FAMOTIDINE IN NACL 20-0.9 MG/50ML-% IV SOLN: 20.0000 mg | Freq: Once | INTRAVENOUS | Status: DC | PRN

## 2019-12-20 MED ORDER — DIPHENHYDRAMINE HCL 50 MG/ML IJ SOLN: 50.0000 mg | Freq: Once | INTRAMUSCULAR | Status: DC | PRN

## 2019-12-20 NOTE — Progress Notes (Signed)
  Diagnosis: COVID-19  Physician: Patrick Wright  Procedure: [facility] Infusion [facility] Med: bamlanivimab\etesevimab infusion - Provided patient with bamlanimivab\etesevimab fact sheet for patients, parents and caregivers prior to infusion.  Complications: No immediate complications noted.  Discharge: Discharged home   Craig Taylor 12/20/2019  

## 2019-12-20 NOTE — Discharge Instructions (Signed)

## 2019-12-21 ENCOUNTER — Ambulatory Visit (HOSPITAL_BASED_OUTPATIENT_CLINIC_OR_DEPARTMENT_OTHER): Admission: RE | Admit: 2019-12-21 | Payer: PPO | Source: Home / Self Care | Admitting: Urology

## 2019-12-21 HISTORY — DX: Personal history of other diseases of the musculoskeletal system and connective tissue: Z87.39

## 2019-12-21 HISTORY — DX: Benign prostatic hyperplasia with lower urinary tract symptoms: N40.1

## 2019-12-21 HISTORY — DX: Unspecified glaucoma: H40.9

## 2019-12-21 HISTORY — DX: Personal history of other diseases of the digestive system: Z87.19

## 2019-12-21 HISTORY — DX: Personal history of other malignant neoplasm of skin: Z85.828

## 2019-12-21 HISTORY — DX: Chronic kidney disease, stage 3 unspecified: N18.30

## 2019-12-21 HISTORY — DX: Complete loss of teeth, unspecified cause, unspecified class: K08.109

## 2019-12-21 HISTORY — DX: Malignant neoplasm of bladder, unspecified: C67.9

## 2019-12-21 HISTORY — DX: Iron deficiency anemia, unspecified: D50.9

## 2019-12-21 HISTORY — DX: Presence of spectacles and contact lenses: Z97.3

## 2019-12-21 HISTORY — DX: Personal history of other malignant neoplasm of skin: Z98.890

## 2019-12-21 SURGERY — TURBT (TRANSURETHRAL RESECTION OF BLADDER TUMOR)
Anesthesia: General

## 2019-12-29 ENCOUNTER — Other Ambulatory Visit: Payer: Self-pay | Admitting: Urology

## 2020-01-09 DIAGNOSIS — N1832 Chronic kidney disease, stage 3b: Secondary | ICD-10-CM | POA: Diagnosis not present

## 2020-01-09 DIAGNOSIS — E1122 Type 2 diabetes mellitus with diabetic chronic kidney disease: Secondary | ICD-10-CM | POA: Diagnosis not present

## 2020-01-09 DIAGNOSIS — I129 Hypertensive chronic kidney disease with stage 1 through stage 4 chronic kidney disease, or unspecified chronic kidney disease: Secondary | ICD-10-CM | POA: Diagnosis not present

## 2020-01-09 DIAGNOSIS — I2510 Atherosclerotic heart disease of native coronary artery without angina pectoris: Secondary | ICD-10-CM | POA: Diagnosis not present

## 2020-01-17 ENCOUNTER — Other Ambulatory Visit: Payer: Self-pay

## 2020-01-17 ENCOUNTER — Encounter (HOSPITAL_BASED_OUTPATIENT_CLINIC_OR_DEPARTMENT_OTHER): Payer: Self-pay | Admitting: Urology

## 2020-01-17 NOTE — Progress Notes (Addendum)
Addendum: spoke with [place] zanetto pa patient meets [facility] guidelines per Janett Billow zanetto pa  Spoke w/ via phone for pre-op interview---patient Lab needs dos---- I stat 8 and ekg             Lab results------none COVID test ------+ covid 12-17-2019 no retest needed Arrive at -------630 am 01-25-2020 NPO after ------midnight Medications to take morning of surgery -----flomax, lopressor with sips of water Diabetic medication -----do not take metformin morning of surgery Patient Special Instructions -----n/a Pre-Op special Istructions -----n/a Patient verbalized understanding of instructions that were given at this phone interview. Patient denies shortness of breath, chest pain, fever, cough a this phone interview.  Anesthesia : Hx MI/cad s/p Pci with des to rca and pda, htn type 2 dm, ckd 3. Patient denies any cardiac s and s and stated has has never taken nitro since prescription in 2003. Chart to be reviewed by Konrad Felix pa  PCP: dr lawrence fusco (per pt lov 08-2019) Cardiologist : dr Natividad Brood 08-18-2019 epic Chest x-ray : 09-12-2014 epic EKG :8-09-12-16 epic Echo : 03-30-2006 epic Cardiac Cath : last one 10-04-2002 epic Stress test nuclear 05-11-2018 epic Sleep Study/ CPAP :no Fasting Blood Sugar :120-140      / Checks Blood Sugar -3  times a day:   Blood Thinner/ Instructions /Last Dose:n/a ASA / Instructions/ Last Dose : per pt instructed by dr winter to stop aspirin 1 week before surgery  Patient denies shortness of breath, chest pain, fever, and cough at this phone interview.

## 2020-01-23 ENCOUNTER — Encounter (HOSPITAL_BASED_OUTPATIENT_CLINIC_OR_DEPARTMENT_OTHER): Payer: Self-pay | Admitting: Urology

## 2020-01-25 ENCOUNTER — Encounter (HOSPITAL_BASED_OUTPATIENT_CLINIC_OR_DEPARTMENT_OTHER): Payer: Self-pay | Admitting: Urology

## 2020-01-25 ENCOUNTER — Encounter (HOSPITAL_BASED_OUTPATIENT_CLINIC_OR_DEPARTMENT_OTHER)
Admission: RE | Disposition: A | Discharge status: Home / Self Care | Payer: Self-pay | Source: Home / Self Care | Attending: Urology

## 2020-01-25 ENCOUNTER — Ambulatory Visit (HOSPITAL_BASED_OUTPATIENT_CLINIC_OR_DEPARTMENT_OTHER): Payer: PPO | Admitting: Physician Assistant

## 2020-01-25 ENCOUNTER — Other Ambulatory Visit: Payer: Self-pay

## 2020-01-25 ENCOUNTER — Ambulatory Visit (HOSPITAL_BASED_OUTPATIENT_CLINIC_OR_DEPARTMENT_OTHER)
Admission: RE | Admit: 2020-01-25 | Discharge: 2020-01-25 | Disposition: A | Discharge status: Home / Self Care | Payer: PPO | Attending: Urology | Admitting: Urology

## 2020-01-25 DIAGNOSIS — I4510 Unspecified right bundle-branch block: Secondary | ICD-10-CM | POA: Diagnosis not present

## 2020-01-25 DIAGNOSIS — Z79899 Other long term (current) drug therapy: Secondary | ICD-10-CM | POA: Insufficient documentation

## 2020-01-25 DIAGNOSIS — Z7984 Long term (current) use of oral hypoglycemic drugs: Secondary | ICD-10-CM | POA: Insufficient documentation

## 2020-01-25 DIAGNOSIS — I252 Old myocardial infarction: Secondary | ICD-10-CM | POA: Insufficient documentation

## 2020-01-25 DIAGNOSIS — Z85828 Personal history of other malignant neoplasm of skin: Secondary | ICD-10-CM | POA: Insufficient documentation

## 2020-01-25 DIAGNOSIS — Z7982 Long term (current) use of aspirin: Secondary | ICD-10-CM | POA: Diagnosis not present

## 2020-01-25 DIAGNOSIS — Z87891 Personal history of nicotine dependence: Secondary | ICD-10-CM | POA: Insufficient documentation

## 2020-01-25 DIAGNOSIS — D494 Neoplasm of unspecified behavior of bladder: Secondary | ICD-10-CM | POA: Diagnosis not present

## 2020-01-25 DIAGNOSIS — I2510 Atherosclerotic heart disease of native coronary artery without angina pectoris: Secondary | ICD-10-CM | POA: Diagnosis not present

## 2020-01-25 DIAGNOSIS — C671 Malignant neoplasm of dome of bladder: Secondary | ICD-10-CM | POA: Insufficient documentation

## 2020-01-25 DIAGNOSIS — M109 Gout, unspecified: Secondary | ICD-10-CM | POA: Insufficient documentation

## 2020-01-25 DIAGNOSIS — I129 Hypertensive chronic kidney disease with stage 1 through stage 4 chronic kidney disease, or unspecified chronic kidney disease: Secondary | ICD-10-CM | POA: Insufficient documentation

## 2020-01-25 DIAGNOSIS — I491 Atrial premature depolarization: Secondary | ICD-10-CM | POA: Insufficient documentation

## 2020-01-25 DIAGNOSIS — D090 Carcinoma in situ of bladder: Secondary | ICD-10-CM | POA: Diagnosis not present

## 2020-01-25 DIAGNOSIS — E785 Hyperlipidemia, unspecified: Secondary | ICD-10-CM | POA: Diagnosis not present

## 2020-01-25 DIAGNOSIS — E1122 Type 2 diabetes mellitus with diabetic chronic kidney disease: Secondary | ICD-10-CM | POA: Insufficient documentation

## 2020-01-25 DIAGNOSIS — D414 Neoplasm of uncertain behavior of bladder: Secondary | ICD-10-CM | POA: Diagnosis not present

## 2020-01-25 DIAGNOSIS — N183 Chronic kidney disease, stage 3 unspecified: Secondary | ICD-10-CM | POA: Insufficient documentation

## 2020-01-25 DIAGNOSIS — Z955 Presence of coronary angioplasty implant and graft: Secondary | ICD-10-CM | POA: Diagnosis not present

## 2020-01-25 DIAGNOSIS — Z8551 Personal history of malignant neoplasm of bladder: Secondary | ICD-10-CM | POA: Diagnosis not present

## 2020-01-25 DIAGNOSIS — I10 Essential (primary) hypertension: Secondary | ICD-10-CM | POA: Diagnosis not present

## 2020-01-25 HISTORY — PX: TRANSURETHRAL RESECTION OF BLADDER TUMOR: SHX2575

## 2020-01-25 LAB — POCT I-STAT, CHEM 8
BUN: 21 mg/dL (ref 8–23)
Calcium, Ion: 1.23 mmol/L (ref 1.15–1.40)
Chloride: 107 mmol/L (ref 98–111)
Creatinine, Ser: 1 mg/dL (ref 0.61–1.24)
Glucose, Bld: 151 mg/dL — ABNORMAL HIGH (ref 70–99)
HCT: 34 % — ABNORMAL LOW (ref 39.0–52.0)
Hemoglobin: 11.6 g/dL — ABNORMAL LOW (ref 13.0–17.0)
Potassium: 4.1 mmol/L (ref 3.5–5.1)
Sodium: 141 mmol/L (ref 135–145)
TCO2: 22 mmol/L (ref 22–32)

## 2020-01-25 LAB — GLUCOSE, CAPILLARY: Glucose-Capillary: 127 mg/dL — ABNORMAL HIGH (ref 70–99)

## 2020-01-25 SURGERY — TURBT (TRANSURETHRAL RESECTION OF BLADDER TUMOR)
Anesthesia: General

## 2020-01-25 MED ORDER — ONDANSETRON HCL 4 MG/2ML IJ SOLN
INTRAMUSCULAR | Status: AC
  Filled 2020-01-25: qty 2

## 2020-01-25 MED ORDER — FENTANYL CITRATE (PF) 100 MCG/2ML IJ SOLN
25.0000 ug | INTRAMUSCULAR | Status: DC | PRN
  Administered 2020-01-25: 25 ug via INTRAVENOUS

## 2020-01-25 MED ORDER — OXYCODONE HCL 5 MG PO TABS: 5.0000 mg | ORAL_TABLET | Freq: Once | ORAL | Status: DC | PRN

## 2020-01-25 MED ORDER — OXYBUTYNIN CHLORIDE 5 MG PO TABS: 5.0000 mg | ORAL_TABLET | Freq: Three times a day (TID) | ORAL | 1 refills | Status: DC | PRN

## 2020-01-25 MED ORDER — SODIUM CHLORIDE 0.9 % IV SOLN
INTRAVENOUS | Status: DC
  Administered 2020-01-25: 1000 mL via INTRAVENOUS

## 2020-01-25 MED ORDER — PROPOFOL 10 MG/ML IV BOLUS
INTRAVENOUS | Status: DC | PRN
  Administered 2020-01-25: 150 mg via INTRAVENOUS

## 2020-01-25 MED ORDER — ONDANSETRON HCL 4 MG/2ML IJ SOLN: 4.0000 mg | Freq: Once | INTRAMUSCULAR | Status: DC | PRN

## 2020-01-25 MED ORDER — FENTANYL CITRATE (PF) 100 MCG/2ML IJ SOLN
INTRAMUSCULAR | Status: AC
  Filled 2020-01-25: qty 2

## 2020-01-25 MED ORDER — EPHEDRINE SULFATE-NACL 50-0.9 MG/10ML-% IV SOSY
PREFILLED_SYRINGE | INTRAVENOUS | Status: DC | PRN
  Administered 2020-01-25: 10 mg via INTRAVENOUS

## 2020-01-25 MED ORDER — DEXAMETHASONE SODIUM PHOSPHATE 10 MG/ML IJ SOLN
INTRAMUSCULAR | Status: AC
  Filled 2020-01-25: qty 1

## 2020-01-25 MED ORDER — ONDANSETRON HCL 4 MG/2ML IJ SOLN
INTRAMUSCULAR | Status: DC | PRN
  Administered 2020-01-25: 4 mg via INTRAVENOUS

## 2020-01-25 MED ORDER — ACETAMINOPHEN 325 MG PO TABS: 325.0000 mg | ORAL_TABLET | ORAL | Status: DC | PRN

## 2020-01-25 MED ORDER — BELLADONNA ALKALOIDS-OPIUM 16.2-60 MG RE SUPP
RECTAL | Status: DC | PRN
  Administered 2020-01-25: 1 via RECTAL

## 2020-01-25 MED ORDER — SODIUM CHLORIDE 0.9 % IR SOLN
Status: DC | PRN
  Administered 2020-01-25: 3000 mL via INTRAVESICAL

## 2020-01-25 MED ORDER — FENTANYL CITRATE (PF) 100 MCG/2ML IJ SOLN
INTRAMUSCULAR | Status: DC | PRN
  Administered 2020-01-25 (×2): 50 ug via INTRAVENOUS
  Administered 2020-01-25: 100 ug via INTRAVENOUS

## 2020-01-25 MED ORDER — CIPROFLOXACIN IN D5W 400 MG/200ML IV SOLN
INTRAVENOUS | Status: AC
  Filled 2020-01-25: qty 200

## 2020-01-25 MED ORDER — ACETAMINOPHEN 160 MG/5ML PO SOLN: 325.0000 mg | ORAL | Status: DC | PRN

## 2020-01-25 MED ORDER — PHENAZOPYRIDINE HCL 200 MG PO TABS
200.0000 mg | ORAL_TABLET | Freq: Three times a day (TID) | ORAL | 0 refills | Status: DC | PRN
Start: 2020-01-25 — End: 2020-11-21

## 2020-01-25 MED ORDER — BELLADONNA ALKALOIDS-OPIUM 16.2-60 MG RE SUPP
RECTAL | Status: AC
  Filled 2020-01-25: qty 1

## 2020-01-25 MED ORDER — METOPROLOL TARTRATE 25 MG PO TABS
50.0000 mg | ORAL_TABLET | Freq: Every morning | ORAL | Status: DC
  Administered 2020-01-25: 50 mg via ORAL

## 2020-01-25 MED ORDER — CIPROFLOXACIN IN D5W 400 MG/200ML IV SOLN
400.0000 mg | Freq: Once | INTRAVENOUS | Status: AC
  Administered 2020-01-25: 400 mg via INTRAVENOUS

## 2020-01-25 MED ORDER — MEPERIDINE HCL 25 MG/ML IJ SOLN: 6.2500 mg | INTRAMUSCULAR | Status: DC | PRN

## 2020-01-25 MED ORDER — LIDOCAINE 2% (20 MG/ML) 5 ML SYRINGE
INTRAMUSCULAR | Status: DC | PRN
  Administered 2020-01-25: 80 mg via INTRAVENOUS

## 2020-01-25 MED ORDER — METOPROLOL TARTRATE 25 MG PO TABS
ORAL_TABLET | ORAL | Status: AC
  Filled 2020-01-25: qty 2

## 2020-01-25 MED ORDER — PROPOFOL 10 MG/ML IV BOLUS
INTRAVENOUS | Status: AC
  Filled 2020-01-25: qty 20

## 2020-01-25 MED ORDER — LIDOCAINE 2% (20 MG/ML) 5 ML SYRINGE
INTRAMUSCULAR | Status: AC
  Filled 2020-01-25: qty 5

## 2020-01-25 MED ORDER — OXYCODONE HCL 5 MG/5ML PO SOLN: 5.0000 mg | Freq: Once | ORAL | Status: DC | PRN

## 2020-01-25 SURGICAL SUPPLY — 20 items
BAG DRAIN URO-CYSTO SKYTR STRL (DRAIN) ×3 IMPLANT
BAG DRN RND TRDRP ANRFLXCHMBR (UROLOGICAL SUPPLIES)
BAG DRN UROCATH (DRAIN) ×1
BAG URINE DRAIN 2000ML AR STRL (UROLOGICAL SUPPLIES) IMPLANT
BAG URINE LEG 500ML (DRAIN) IMPLANT
GLOVE BIO SURGEON STRL SZ7.5 (GLOVE) ×3 IMPLANT
GOWN STRL REUS W/TWL XL LVL3 (GOWN DISPOSABLE) ×3 IMPLANT
HOLDER FOLEY CATH W/STRAP (MISCELLANEOUS) IMPLANT
IV NS IRRIG 3000ML ARTHROMATIC (IV SOLUTION) ×3 IMPLANT
LOOP CUT BIPOLAR 24F LRG (ELECTROSURGICAL) ×2 IMPLANT
MANIFOLD NEPTUNE II (INSTRUMENTS) ×3 IMPLANT
NS IRRIG 500ML POUR BTL (IV SOLUTION) IMPLANT
SYR TOOMEY IRRIG 70ML (MISCELLANEOUS)
SYRINGE TOOMEY IRRIG 70ML (MISCELLANEOUS) IMPLANT
TRAY CYSTO PACK (CUSTOM PROCEDURE TRAY) ×3 IMPLANT
TUBE CONNECTING 12'X1/4 (SUCTIONS) ×1
TUBE CONNECTING 12X1/4 (SUCTIONS) ×2 IMPLANT
TUBING UROLOGY SET (TUBING) ×3 IMPLANT
WATER STERILE IRR 3000ML UROMA (IV SOLUTION) IMPLANT
WATER STERILE IRR 500ML POUR (IV SOLUTION) IMPLANT

## 2020-01-25 NOTE — Addendum Note (Signed)
Addendum  created 01/25/20 1012 by Noralyn Pick D, CRNA   Flowsheet accepted, Intraprocedure Event edited, Intraprocedure Flowsheets edited

## 2020-01-25 NOTE — Anesthesia Postprocedure Evaluation (Signed)
Anesthesia Post Note  Patient: Craig Taylor  Procedure(s) Performed: TRANSURETHRAL RESECTION OF BLADDER TUMOR (TURBT)/ CYSTOSCOPY (N/A )     Patient location during evaluation: Phase II Anesthesia Type: General Level of consciousness: awake Pain management: pain level controlled Vital Signs Assessment: post-procedure vital signs reviewed and stable Respiratory status: spontaneous breathing Cardiovascular status: stable Postop Assessment: no apparent nausea or vomiting Anesthetic complications: no   No complications documented.  Last Vitals:  Vitals:   01/25/20 0945 01/25/20 0946  BP: (!) 163/84   Pulse: (!) 48 (!) 49  Resp: 20 13  Temp:    SpO2: 92% 97%    Last Pain:  Vitals:   01/25/20 0941  TempSrc:   PainSc: 4    Pain Goal: Patients Stated Pain Goal: 6 (01/25/20 0941)                 Huston Foley

## 2020-01-25 NOTE — Anesthesia Preprocedure Evaluation (Signed)
Anesthesia Evaluation  Patient identified by MRN, date of birth, ID band Patient awake    Reviewed: Allergy & Precautions, NPO status , Patient's Chart, lab work & pertinent test results, reviewed documented beta blocker date and time   History of Anesthesia Complications Negative for: history of anesthetic complications  Airway Mallampati: III  TM Distance: >3 FB Neck ROM: Full    Dental  (+) Edentulous Upper, Edentulous Lower   Pulmonary former smoker,    Pulmonary exam normal        Cardiovascular Exercise Tolerance: Good hypertension, Pt. on medications and Pt. on home beta blockers + CAD, + Past MI and + Cardiac Stents  Normal cardiovascular exam Rhythm:Regular Rate:Normal     Neuro/Psych negative neurological ROS  negative psych ROS   GI/Hepatic negative GI ROS, Neg liver ROS,   Endo/Other  diabetes, Type 2, Oral Hypoglycemic Agents  Renal/GU Renal InsufficiencyRenal disease  negative genitourinary   Musculoskeletal negative musculoskeletal ROS (+)   Abdominal Normal abdominal exam  (+)   Peds  Hematology   Anesthesia Other Findings   Reproductive/Obstetrics                             Anesthesia Physical  Anesthesia Plan  ASA: III  Anesthesia Plan: General   Post-op Pain Management:    Induction: Intravenous  PONV Risk Score and Plan: 3 and Ondansetron, Dexamethasone, Treatment may vary due to age or medical condition and Midazolam  Airway Management Planned: LMA  Additional Equipment: None  Intra-op Plan:   Post-operative Plan: Extubation in OR  Informed Consent: I have reviewed the patients History and Physical, chart, labs and discussed the procedure including the risks, benefits and alternatives for the proposed anesthesia with the patient or authorized representative who has indicated his/her understanding and acceptance.     Dental advisory given  Plan  Discussed with: CRNA  Anesthesia Plan Comments:         Anesthesia Quick Evaluation

## 2020-01-25 NOTE — H&P (Signed)
Urology Preoperative H&P   Chief Complaint: Recurrent urothelial carcinoma the bladder  History of Present Illness: Craig Taylor is a 79 y.o. 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  The patient underwent a surveillance cystoscopy in April and was found to have multiple small papillary bladder tumors involving the bladder dome.  He is here today for TURBT of his U CC recurrence.  He denies interval UTIs, dysuria or hematuria.  Past Medical History:  Diagnosis Date  . Benign localized prostatic hyperplasia with lower urinary tract symptoms (LUTS)   . Bladder cancer [facility])    urologist--- dr Javen Hinderliter---  s/p  TURBT 09/ 2020  . CKD (chronic kidney disease), stage III    neprologist--- dr Lowanda Foster  (12-15-2019  per pt was released to pcp at [facility] 02-06-2018 scanned in epic under media  . Coronary artery disease cardiologist--- dr Bronson Ing   hx MI in 2003--- 08-02-2002  s/p cardiac cath with PCI and DES to [facility] and PDA;  cath 10-04-2002 patent stents w/ 30-40% AV groove Cx with ef 50% and mild inferior hypokinesis  . COVID-19 12/16/2019   asymptomatic  . Full dentures   . Glaucoma, right eye   . History of basal cell carcinoma (BCC) excision   . History of gout    per pt one episode 01/ 2021   . History of kidney stones   . History of MI (myocardial infarction) 08/01/2002  . History of ulcer of large intestine    per pt approx. 2002 told due to oral voltaren , colonoscopy/ egd with no intervention,  but did have blood transfusions  . Hyperlipidemia   . Hypertension    followed by pcp   (nuclear stress test in epic 05-11-2018  low risk w/ no ischemia, nuclear ef 55%)  . IDA (iron deficiency anemia)   . Nephrolithiasis   . Non-insulin dependent type 2 diabetes mellitus ([place])    followed by pcp  (12-15-2019   per pt checks blood sugar 3 times a week,  fasting sugar-- 120-140)  . S/P drug eluting coronary stent placement 08/02/2002   to proximal RCA  and PDA  . Wears glasses     Past Surgical History:  Procedure Laterality Date  . BIOPSY  07/13/2018   Procedure: BIOPSY;  Surgeon: Daneil Dolin, MD;  [facility];  Service: Endoscopy;;  (gastric)  . CARDIAC CATHETERIZATION  08/02/2002   dr berry   Crux of RCA 95% stenosis, stented with a 2.5x69mm CYPHER stent resulting in reduction of 95% stenosis to 0% residual; overlapping CYPHER stents -3x84mm and 3x48mm- deployed at 15atm resulting in reduction of 70% stenosis to 0% residual.  . CARDIAC CATHETERIZATION  10/04/2002    dr berry   patent stents w/ 30-40% AV groove LCx , mild inferior hypokinesis, ef 50%  . COLONOSCOPY N/A 05/31/2014   Dr. Gala Romney: Normal  . COLONOSCOPY N/A 07/13/2018   Procedure: COLONOSCOPY;  Surgeon: Daneil Dolin, MD;  [facility];  Service: Endoscopy;  Laterality: N/A;  8:30am  . ESOPHAGOGASTRODUODENOSCOPY N/A 07/13/2018   Procedure: ESOPHAGOGASTRODUODENOSCOPY (EGD);  Surgeon: Daneil Dolin, MD;  Location: [facility];  Service: Endoscopy;  Laterality: N/A;  . TRANSURETHRAL RESECTION OF BLADDER TUMOR N/A 04/22/2019   Procedure: TRANSURETHRAL RESECTION OF BLADDER TUMOR (TURBT)/ CYSTOSCOPY BILATERAL RETROGRADE PYELOGRAMS,  GEMCITABINE INSTILLATION;  Surgeon: Ceasar Mons, MD;  Location: [facility];  Service: Urology;  Laterality: N/A;  ONLY NEEDS 45 MIN  Allergies:  Allergies  Allergen Reactions  . Lipitor [Atorvastatin]     pruritis  . Voltaren [Diclofenac Sodium] Other (See Comments)    "caused intestine ulcers with bleeding"  . Penicillins Rash    Has patient had a PCN reaction causing immediate rash, facial/tongue/throat swelling, SOB or lightheadedness with hypotension: No Has patient had a PCN reaction causing severe rash involving mucus membranes or skin necrosis: No Has patient had a PCN reaction that required hospitalization: No Has patient had a PCN reaction occurring within the last 10 years:  No If all of the above answers are "NO", then may proceed with Cephalosporin use.     Family History  Problem Relation Age of Onset  . Stroke Mother   . Diabetes Mother   . Colon cancer Neg Hx     Social History:  reports that he quit smoking about 36 years ago. His smoking use included cigarettes. He started smoking about 62 years ago. He has a 42.00 pack-year smoking history. He quit smokeless tobacco use about 7 years ago.  His smokeless tobacco use included chew. He reports that he does not drink alcohol and does not use drugs.  ROS: A complete review of systems was performed.  All systems are negative except for pertinent findings as noted.  Physical Exam:  Vital signs in last 24 hours: Temp:  [97.8 F (36.6 C)] 97.8 F (36.6 C) (06/16 0712) Pulse Rate:  [62] 62 (06/16 0712) Resp:  [16] 16 (06/16 0712) BP: (155)/(90) 155/90 (06/16 0712) SpO2:  [96 %] 96 % (06/16 0712) Weight:  [111.7 kg] 111.7 kg (06/16 0712) Constitutional:  Alert and oriented, No acute distress Cardiovascular: Regular rate and rhythm, No JVD Respiratory: Normal respiratory effort, Lungs clear bilaterally GI: Abdomen is soft, nontender, nondistended, no abdominal masses GU: No CVA tenderness Lymphatic: No lymphadenopathy Neurologic: Grossly intact, no focal deficits Psychiatric: Normal mood and affect  Laboratory Data:  No results for input(s): WBC, HGB, HCT, PLT in the last 72 hours.  No results for input(s): NA, K, CL, GLUCOSE, BUN, CALCIUM, CREATININE in the last 72 hours.  Invalid input(s): CO3   No results found for this or any previous visit (from the past 24 hour(s)). No results found for this or any previous visit (from the past 240 hour(s)).  Renal Function: No results for input(s): CREATININE in the last 168 hours. CrCl cannot be calculated (Patient's most recent lab result is older than the maximum 21 days allowed.).  Radiologic Imaging: No results found.  I independently  reviewed the above imaging studies.  Assessment and Plan Craig Taylor is a 79 y.o. male with a history of recurrent high-grade TA urothelial carcinoma of the bladder  -The risks, benefits and alternatives of cystoscopy with TURBT was discussed with the patient. The risks include, but are not limited to, bleeding, urinary tract infection, bladder perforation requiring prolonged catheterization and/or open bladder repair, ureteral obstruction, voiding dysfunction and the inherent risks of general anesthesia. The patient voices understanding and wishes to proceed.    Ellison Hughs, MD 01/25/2020, 7:36 AM  [facility] Pager: [phone]

## 2020-01-25 NOTE — Anesthesia Procedure Notes (Signed)
Procedure Name: LMA Insertion Date/Time: 01/25/2020 8:21 AM Performed by: Momin Misko D, CRNA Pre-anesthesia Checklist: Patient identified, Emergency Drugs available, Suction available and Patient being monitored Patient Re-evaluated:Patient Re-evaluated prior to induction Oxygen Delivery Method: Circle system utilized Preoxygenation: Pre-oxygenation with 100% oxygen Induction Type: IV induction Ventilation: Mask ventilation without difficulty LMA: LMA inserted LMA Size: 5.0 Tube type: Oral Number of attempts: 1 Placement Confirmation: positive ETCO2 and breath sounds checked- equal and bilateral Tube secured with: Tape Dental Injury: Teeth and Oropharynx as per pre-operative assessment

## 2020-01-25 NOTE — Discharge Instructions (Signed)

## 2020-01-25 NOTE — Transfer of Care (Signed)
Immediate Anesthesia Transfer of Care Note  Patient: Craig Taylor  Procedure(s) Performed: TRANSURETHRAL RESECTION OF BLADDER TUMOR (TURBT)/ CYSTOSCOPY (N/A )  Patient Location: PACU  Anesthesia Type:General  Level of Consciousness: awake, alert  and oriented  Airway & Oxygen Therapy: Patient Spontanous Breathing and Patient connected to nasal cannula oxygen  Post-op Assessment: Report given to RN and Post -op Vital signs reviewed and stable  Post vital signs: Reviewed and stable  Last Vitals:  Vitals Value Taken Time  BP 169/82 01/25/20 0915  Temp    Pulse 54 01/25/20 0916  Resp 17 01/25/20 0916  SpO2 97 % 01/25/20 0916  Vitals shown include unvalidated device data.  Last Pain:  Vitals:   01/25/20 0712  TempSrc: Oral  PainSc: 0-No pain      Patients Stated Pain Goal: 6 (76/39/43 2003)  Complications: No complications documented.

## 2020-01-25 NOTE — Op Note (Signed)
Operative Note  Preoperative diagnosis:  1.  Multiple 5 mm papillary bladder tumors involving the bladder dome 2.  History of high-grade TA urothelial carcinoma of the bladder  Postoperative diagnosis: 1.  Multiple 5 mm papillary bladder tumors involving the bladder dome 2.  Edematous and erythematous bladder neck 3.  History of high-grade TA urothelial carcinoma of the bladder  Procedure(s): 1.  Cystoscopy with TURBT (small)  Surgeon: Ellison Hughs, MD  Assistants:  None  Anesthesia:  General  Complications:  None  EBL: 5 mL  Specimens: 1.  Bladder dome tumors 2.  Bladder neck biopsy  Drains/Catheters: 1.  None  Intraoperative findings:   1. Multiple 5 mm papillary bladder tumors were seen involving the bladder dome 2. The bladder neck/prostate median lobe was edematous and erythematous but no definitive papillary bladder tumor was identified.  Indication:  Craig Taylor is a 79 y.o. male with a history of high-grade TA urothelial carcinoma the bladder.  He was found to have multiple small bladder tumor recurrences on office cystoscopy in April and is here today for TURBT.  He has been consented for the above procedures, voices understanding and wishes to proceed.  Description of procedure:  After informed consent was obtained, the patient was brought to the operating room and general LMA anesthesia was administered. The patient was then placed in the dorsolithotomy position and prepped and draped in the usual sterile fashion. A timeout was performed. A 23 French rigid cystoscope was then inserted into the urethral meatus and advanced into the bladder under direct vision. A complete bladder survey revealed the findings listed above.  The rigid cystoscope was exchanged for a 26 French resectoscope with a bipolar loop working element.  The papillary bladder tumors involving the bladder dome were then resected down to detrusor musculature.  The areas of resection were  then extensively fulgurated until hemostasis was achieved.  There was no evidence of bladder perforation.  The bladder neck was edematous and erythematous and multiple biopsies were obtained with the bipolar loop and sent off for permanent section.  The bladder neck was also extensively fulgurated until hemostasis was achieved.  His bladder was then drained.  He tolerated the procedure well and was transferred to the postanesthesia in stable condition.  Plan: Follow-up on 02/09/2020 to discuss pathology results

## 2020-01-26 ENCOUNTER — Encounter (HOSPITAL_BASED_OUTPATIENT_CLINIC_OR_DEPARTMENT_OTHER): Payer: Self-pay | Admitting: Urology

## 2020-01-26 LAB — SURGICAL PATHOLOGY

## 2020-02-08 DIAGNOSIS — N1832 Chronic kidney disease, stage 3b: Secondary | ICD-10-CM | POA: Diagnosis not present

## 2020-02-08 DIAGNOSIS — I129 Hypertensive chronic kidney disease with stage 1 through stage 4 chronic kidney disease, or unspecified chronic kidney disease: Secondary | ICD-10-CM | POA: Diagnosis not present

## 2020-02-08 DIAGNOSIS — E1122 Type 2 diabetes mellitus with diabetic chronic kidney disease: Secondary | ICD-10-CM | POA: Diagnosis not present

## 2020-02-08 DIAGNOSIS — I2510 Atherosclerotic heart disease of native coronary artery without angina pectoris: Secondary | ICD-10-CM | POA: Diagnosis not present

## 2020-02-16 DIAGNOSIS — C671 Malignant neoplasm of dome of bladder: Secondary | ICD-10-CM | POA: Diagnosis not present

## 2020-02-16 DIAGNOSIS — R310 Gross hematuria: Secondary | ICD-10-CM | POA: Diagnosis not present

## 2020-03-09 DIAGNOSIS — N1832 Chronic kidney disease, stage 3b: Secondary | ICD-10-CM | POA: Diagnosis not present

## 2020-03-09 DIAGNOSIS — I129 Hypertensive chronic kidney disease with stage 1 through stage 4 chronic kidney disease, or unspecified chronic kidney disease: Secondary | ICD-10-CM | POA: Diagnosis not present

## 2020-03-09 DIAGNOSIS — I2510 Atherosclerotic heart disease of native coronary artery without angina pectoris: Secondary | ICD-10-CM | POA: Diagnosis not present

## 2020-03-09 DIAGNOSIS — E1122 Type 2 diabetes mellitus with diabetic chronic kidney disease: Secondary | ICD-10-CM | POA: Diagnosis not present

## 2020-03-20 DIAGNOSIS — C671 Malignant neoplasm of dome of bladder: Secondary | ICD-10-CM | POA: Diagnosis not present

## 2020-03-20 DIAGNOSIS — Z5111 Encounter for antineoplastic chemotherapy: Secondary | ICD-10-CM | POA: Diagnosis not present

## 2020-03-22 DIAGNOSIS — E119 Type 2 diabetes mellitus without complications: Secondary | ICD-10-CM | POA: Diagnosis not present

## 2020-03-22 DIAGNOSIS — Z1389 Encounter for screening for other disorder: Secondary | ICD-10-CM | POA: Diagnosis not present

## 2020-03-22 DIAGNOSIS — I2510 Atherosclerotic heart disease of native coronary artery without angina pectoris: Secondary | ICD-10-CM | POA: Diagnosis not present

## 2020-03-22 DIAGNOSIS — E6609 Other obesity due to excess calories: Secondary | ICD-10-CM | POA: Diagnosis not present

## 2020-03-22 DIAGNOSIS — I10 Essential (primary) hypertension: Secondary | ICD-10-CM | POA: Diagnosis not present

## 2020-03-22 DIAGNOSIS — Z0000 Encounter for general adult medical examination without abnormal findings: Secondary | ICD-10-CM | POA: Diagnosis not present

## 2020-03-22 DIAGNOSIS — Z6831 Body mass index (BMI) 31.0-31.9, adult: Secondary | ICD-10-CM | POA: Diagnosis not present

## 2020-03-22 DIAGNOSIS — E7849 Other hyperlipidemia: Secondary | ICD-10-CM | POA: Diagnosis not present

## 2020-03-22 DIAGNOSIS — C672 Malignant neoplasm of lateral wall of bladder: Secondary | ICD-10-CM | POA: Diagnosis not present

## 2020-03-22 DIAGNOSIS — N401 Enlarged prostate with lower urinary tract symptoms: Secondary | ICD-10-CM | POA: Diagnosis not present

## 2020-03-22 DIAGNOSIS — Z0001 Encounter for general adult medical examination with abnormal findings: Secondary | ICD-10-CM | POA: Diagnosis not present

## 2020-03-27 DIAGNOSIS — Z5111 Encounter for antineoplastic chemotherapy: Secondary | ICD-10-CM | POA: Diagnosis not present

## 2020-03-27 DIAGNOSIS — C671 Malignant neoplasm of dome of bladder: Secondary | ICD-10-CM | POA: Diagnosis not present

## 2020-04-03 DIAGNOSIS — C672 Malignant neoplasm of lateral wall of bladder: Secondary | ICD-10-CM | POA: Diagnosis not present

## 2020-04-03 DIAGNOSIS — Z5111 Encounter for antineoplastic chemotherapy: Secondary | ICD-10-CM | POA: Diagnosis not present

## 2020-04-10 DIAGNOSIS — Z5111 Encounter for antineoplastic chemotherapy: Secondary | ICD-10-CM | POA: Diagnosis not present

## 2020-04-10 DIAGNOSIS — N1832 Chronic kidney disease, stage 3b: Secondary | ICD-10-CM | POA: Diagnosis not present

## 2020-04-10 DIAGNOSIS — E1122 Type 2 diabetes mellitus with diabetic chronic kidney disease: Secondary | ICD-10-CM | POA: Diagnosis not present

## 2020-04-10 DIAGNOSIS — I2510 Atherosclerotic heart disease of native coronary artery without angina pectoris: Secondary | ICD-10-CM | POA: Diagnosis not present

## 2020-04-10 DIAGNOSIS — I129 Hypertensive chronic kidney disease with stage 1 through stage 4 chronic kidney disease, or unspecified chronic kidney disease: Secondary | ICD-10-CM | POA: Diagnosis not present

## 2020-04-10 DIAGNOSIS — C671 Malignant neoplasm of dome of bladder: Secondary | ICD-10-CM | POA: Diagnosis not present

## 2020-04-17 DIAGNOSIS — Z5111 Encounter for antineoplastic chemotherapy: Secondary | ICD-10-CM | POA: Diagnosis not present

## 2020-04-17 DIAGNOSIS — C671 Malignant neoplasm of dome of bladder: Secondary | ICD-10-CM | POA: Diagnosis not present

## 2020-04-24 DIAGNOSIS — Z5111 Encounter for antineoplastic chemotherapy: Secondary | ICD-10-CM | POA: Diagnosis not present

## 2020-04-24 DIAGNOSIS — C671 Malignant neoplasm of dome of bladder: Secondary | ICD-10-CM | POA: Diagnosis not present

## 2020-05-10 DIAGNOSIS — I129 Hypertensive chronic kidney disease with stage 1 through stage 4 chronic kidney disease, or unspecified chronic kidney disease: Secondary | ICD-10-CM | POA: Diagnosis not present

## 2020-05-10 DIAGNOSIS — E7849 Other hyperlipidemia: Secondary | ICD-10-CM | POA: Diagnosis not present

## 2020-05-10 DIAGNOSIS — I2510 Atherosclerotic heart disease of native coronary artery without angina pectoris: Secondary | ICD-10-CM | POA: Diagnosis not present

## 2020-05-10 DIAGNOSIS — E1122 Type 2 diabetes mellitus with diabetic chronic kidney disease: Secondary | ICD-10-CM | POA: Diagnosis not present

## 2020-05-31 ENCOUNTER — Other Ambulatory Visit: Payer: Self-pay | Admitting: *Deleted

## 2020-05-31 MED ORDER — METOPROLOL TARTRATE 50 MG PO TABS: 50.0000 mg | ORAL_TABLET | Freq: Two times a day (BID) | ORAL | 1 refills | Status: DC

## 2020-06-09 DIAGNOSIS — I2510 Atherosclerotic heart disease of native coronary artery without angina pectoris: Secondary | ICD-10-CM | POA: Diagnosis not present

## 2020-06-09 DIAGNOSIS — E1122 Type 2 diabetes mellitus with diabetic chronic kidney disease: Secondary | ICD-10-CM | POA: Diagnosis not present

## 2020-06-09 DIAGNOSIS — E7849 Other hyperlipidemia: Secondary | ICD-10-CM | POA: Diagnosis not present

## 2020-06-09 DIAGNOSIS — I129 Hypertensive chronic kidney disease with stage 1 through stage 4 chronic kidney disease, or unspecified chronic kidney disease: Secondary | ICD-10-CM | POA: Diagnosis not present

## 2020-06-18 ENCOUNTER — Other Ambulatory Visit: Payer: Self-pay | Admitting: Cardiology

## 2020-06-18 DIAGNOSIS — R3915 Urgency of urination: Secondary | ICD-10-CM | POA: Diagnosis not present

## 2020-06-18 DIAGNOSIS — N528 Other male erectile dysfunction: Secondary | ICD-10-CM | POA: Diagnosis not present

## 2020-06-18 DIAGNOSIS — N401 Enlarged prostate with lower urinary tract symptoms: Secondary | ICD-10-CM | POA: Diagnosis not present

## 2020-06-18 DIAGNOSIS — C671 Malignant neoplasm of dome of bladder: Secondary | ICD-10-CM | POA: Diagnosis not present

## 2020-06-18 MED ORDER — NITROGLYCERIN 0.4 MG SL SUBL: 0.4000 mg | SUBLINGUAL_TABLET | SUBLINGUAL | 1 refills | Status: DC | PRN

## 2020-06-18 NOTE — Telephone Encounter (Signed)
    1. Which medications need to be refilled? (please list name of each medication and dose if known) NITROSTAT   2. Which pharmacy/location (including street and city if local pharmacy) is medication to be sent to? [facility]  3. Do they need a 30 day or 90 day supply?

## 2020-06-30 IMAGING — CT CT ABD-PELV W/ CM
2 of 3 series · 14 of 42 positions shown, 18 images · IV contrast (Isovue)
Comparison: Ultrasound, 10/29/2017.  CT, 03/29/2012.

CLINICAL DATA: Vomiting and headache beginning early this morning.

EXAM:
CT ABDOMEN AND PELVIS WITH CONTRAST
TECHNIQUE: Multidetector CT imaging of the abdomen and pelvis was performed
using the standard protocol following bolus administration of
intravenous contrast.
CONTRAST:  100mL [id number] IOPAMIDOL ([id number]) INJECTION 61%

[Series 2: axial st · axial · 0.82mm/px · z∈[-729,-239]mm · 11 of 112 slices shown, 15 images]
[im 9/112  soft-tissue]
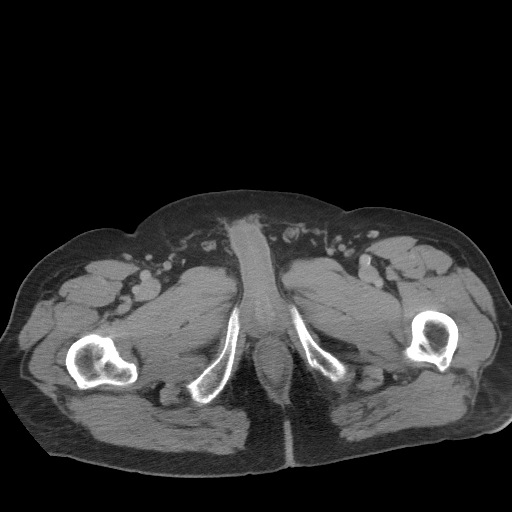
[im 9/112  bone]
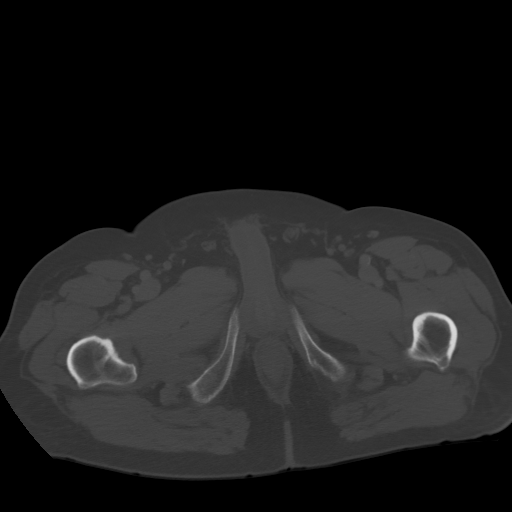
[im 23/112  soft-tissue]
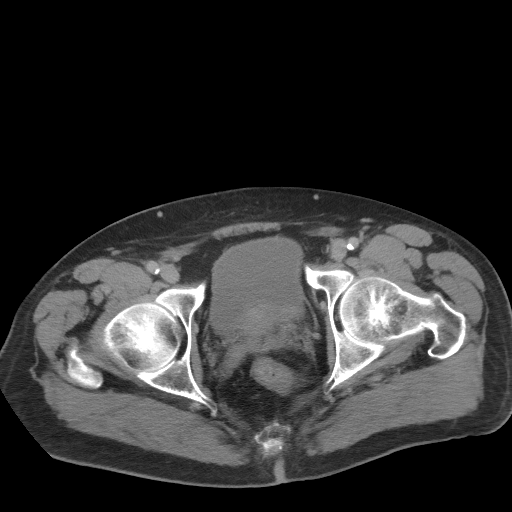
[im 32/112  soft-tissue]
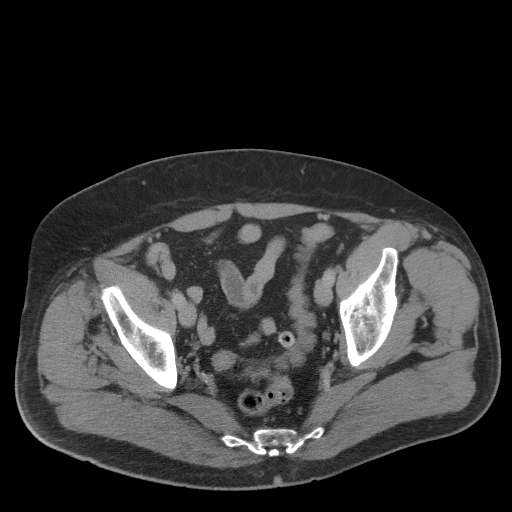
[im 45/112  soft-tissue]
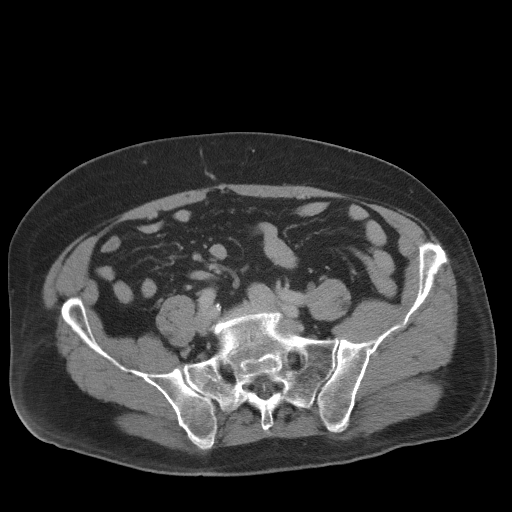
[im 58/112  soft-tissue]
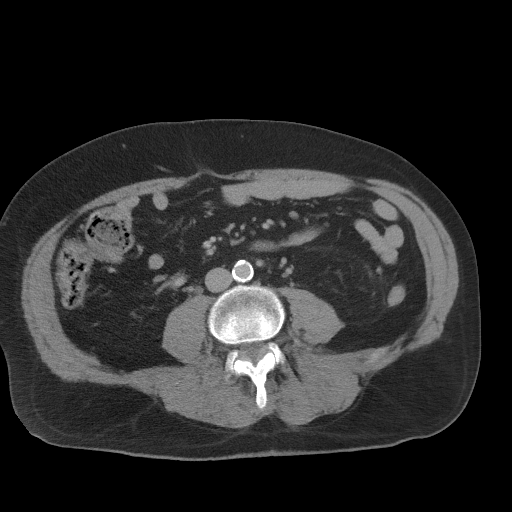
[im 67/112  soft-tissue]
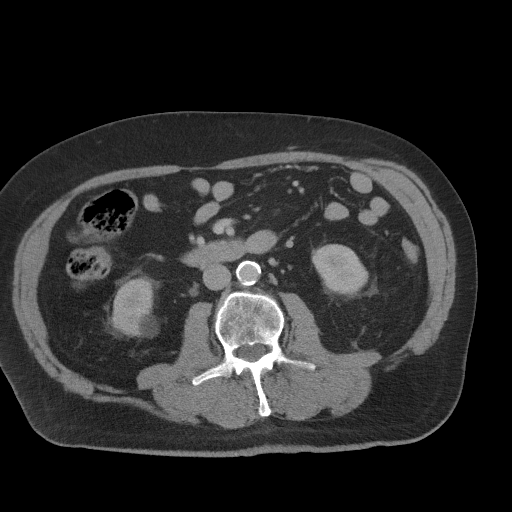
[im 80/112  soft-tissue]
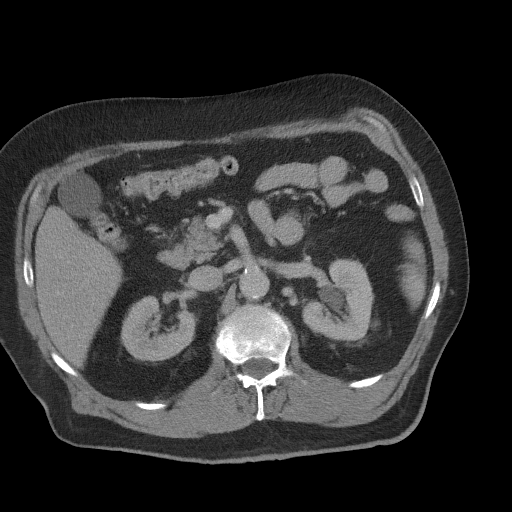
[im 94/112  soft-tissue]
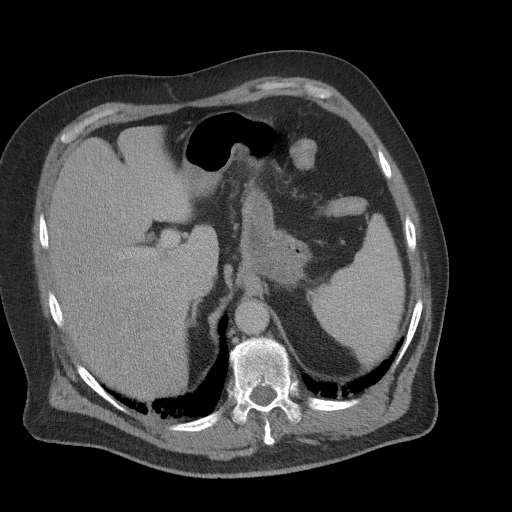
[im 94/112  lung]
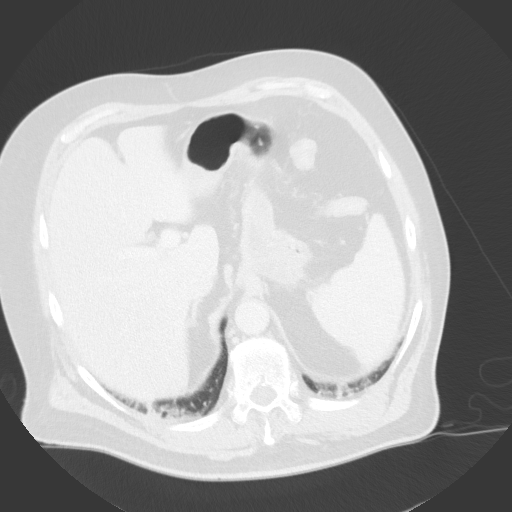
[im 98/112  lung]
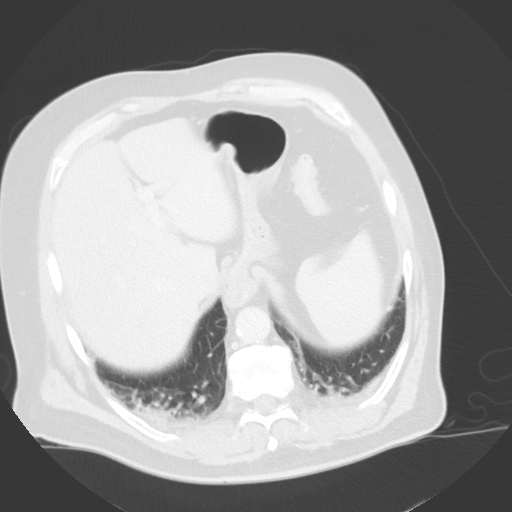
[im 103/112  soft-tissue]
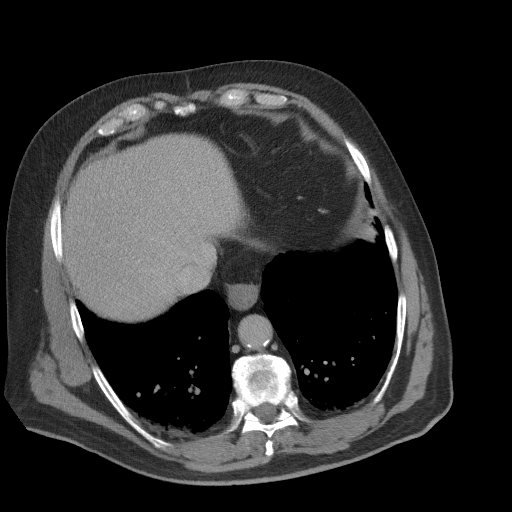
[im 103/112  lung]
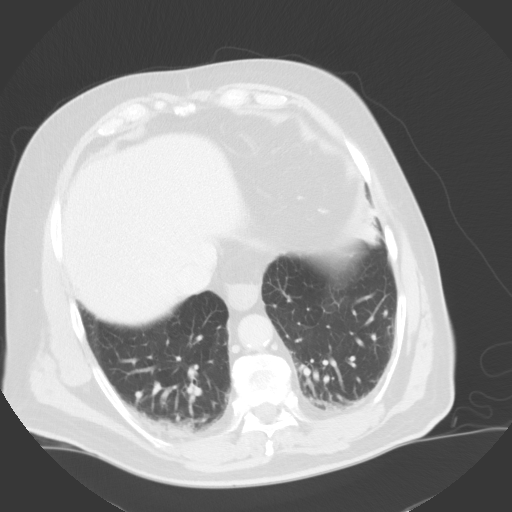
[im 103/112  bone]
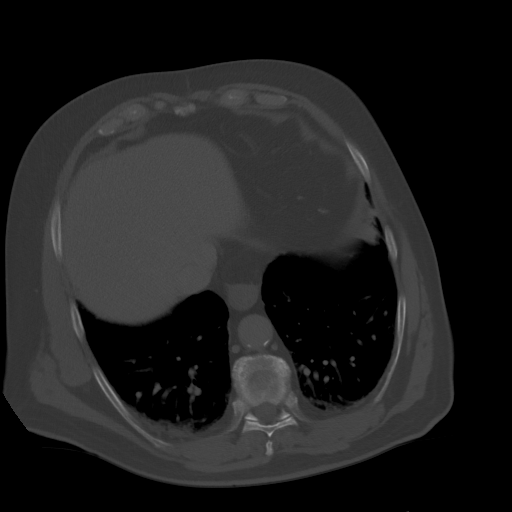
[im 107/112  lung]
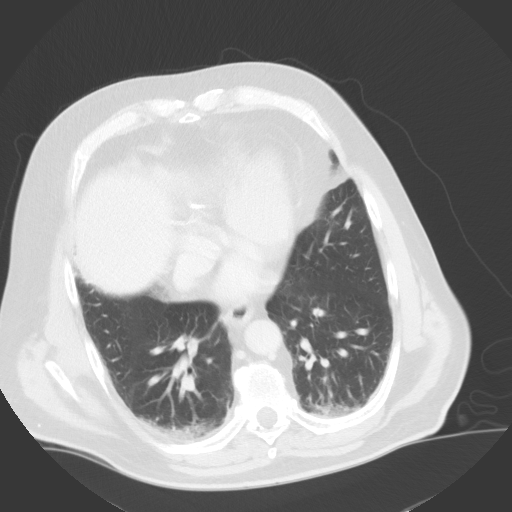

[Series 5: coronal st · coronal · 0.97mm/px · 3 of 111 slices shown]
[im 37/111  soft-tissue]
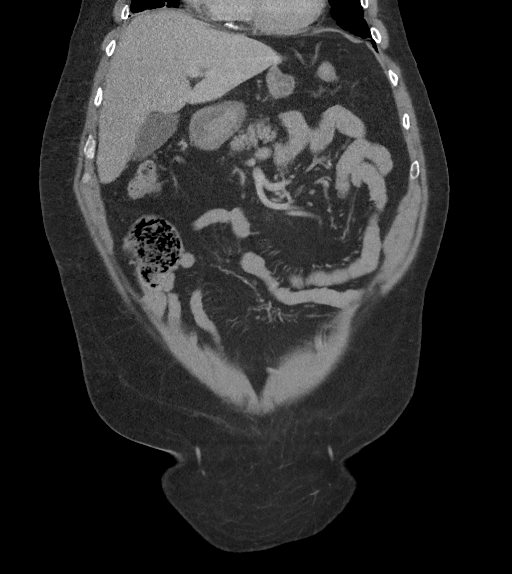
[im 49/111  soft-tissue]
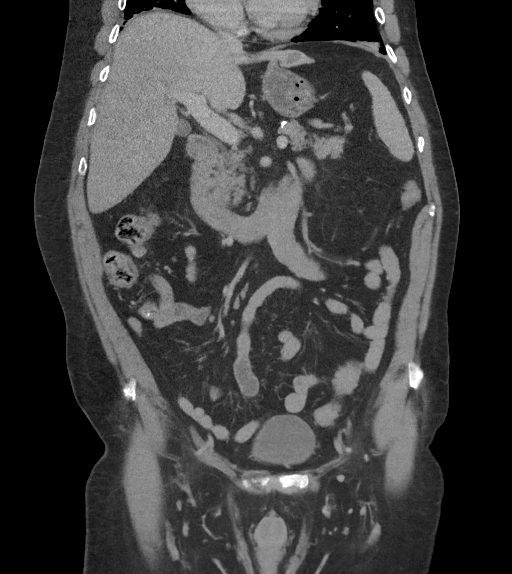
[im 62/111  soft-tissue]
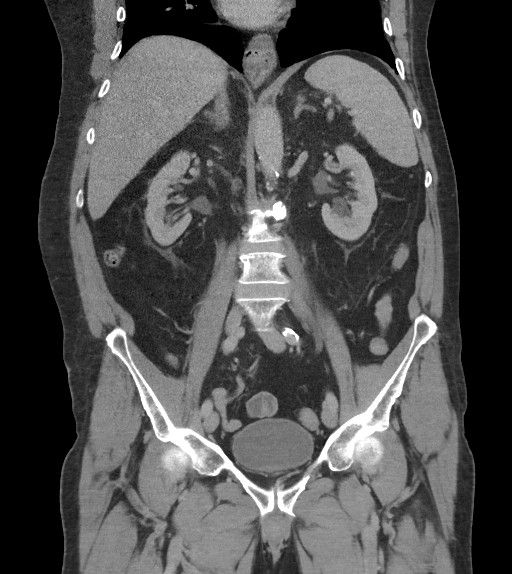

[14 of 42 positions shown; findings below may reference images not displayed]

FINDINGS: Lower chest: Mild dependent atelectasis. No acute findings. Mild
cardiomegaly.

Hepatobiliary: No focal liver abnormality is seen. No gallstones,
gallbladder wall thickening, or biliary dilatation.

Pancreas: Unremarkable. No pancreatic ductal dilatation or
surrounding inflammatory changes.

Spleen: Normal in size without focal abnormality.

Adrenals/Urinary Tract: No adrenal masses.

Kidneys normal in overall size and position. Symmetric renal
enhancement and excretion. Are tiny nonobstructing intrarenal
stones. Mood is the mass is noted bilaterally consistent with cysts,
largest from the lower pole the right kidney measuring 18 mm. Are
renal sinus cysts on the left. No hydronephrosis.

Ureters normal in course and in caliber. No ureteral stones. Bladder
is unremarkable

Stomach/Bowel: Small hiatal hernia. Stomach otherwise unremarkable
small bowel is normal in caliber. No wall thickening or inflammation
colon is normal in caliber with no wall thickening or inflammation.
Appendix not visualized. No evidence of appendicitis.

Vascular/Lymphatic: Aortic atherosclerosis. No aneurysm no
adenopathy.

Reproductive: Prostate enlarged measuring 5.6 x 4.1 x 6.1 cm.

Other: No abdominal wall hernia or abnormality. No abdominopelvic
ascites.

Musculoskeletal: There are vertebral endplate depressions, greatest
at L3. This appears chronic, is new when compared to the prior CT.
This could reflect a recent fracture there is back pain. No other
evidence of a fracture. No osteoblastic or osteolytic lesions.
IMPRESSION: 1. No acute findings within the abdomen or pelvis. No findings to
account for the patient's symptoms.
2. L3 vertebral compression deformity, most likely chronic.
3. Aortic atherosclerosis.
4. Tiny intrarenal stones. No ureteral stone or obstructive
uropathy. Bilateral renal cysts.

## 2020-07-10 DIAGNOSIS — I2510 Atherosclerotic heart disease of native coronary artery without angina pectoris: Secondary | ICD-10-CM | POA: Diagnosis not present

## 2020-07-10 DIAGNOSIS — E7849 Other hyperlipidemia: Secondary | ICD-10-CM | POA: Diagnosis not present

## 2020-07-10 DIAGNOSIS — I129 Hypertensive chronic kidney disease with stage 1 through stage 4 chronic kidney disease, or unspecified chronic kidney disease: Secondary | ICD-10-CM | POA: Diagnosis not present

## 2020-07-10 DIAGNOSIS — E1122 Type 2 diabetes mellitus with diabetic chronic kidney disease: Secondary | ICD-10-CM | POA: Diagnosis not present

## 2020-08-10 DIAGNOSIS — E1122 Type 2 diabetes mellitus with diabetic chronic kidney disease: Secondary | ICD-10-CM | POA: Diagnosis not present

## 2020-08-10 DIAGNOSIS — I2510 Atherosclerotic heart disease of native coronary artery without angina pectoris: Secondary | ICD-10-CM | POA: Diagnosis not present

## 2020-08-10 DIAGNOSIS — I129 Hypertensive chronic kidney disease with stage 1 through stage 4 chronic kidney disease, or unspecified chronic kidney disease: Secondary | ICD-10-CM | POA: Diagnosis not present

## 2020-08-10 DIAGNOSIS — E7849 Other hyperlipidemia: Secondary | ICD-10-CM | POA: Diagnosis not present

## 2020-08-16 ENCOUNTER — Ambulatory Visit: Payer: Medicare PPO | Admitting: Cardiology

## 2020-08-16 ENCOUNTER — Encounter: Payer: Self-pay | Admitting: *Deleted

## 2020-08-16 ENCOUNTER — Encounter: Payer: Self-pay | Admitting: Cardiology

## 2020-08-16 VITALS — BP 120/70 | HR 56 | Resp 16 | Ht 78.0 in | Wt 247.6 lb

## 2020-08-16 DIAGNOSIS — I10 Essential (primary) hypertension: Secondary | ICD-10-CM | POA: Diagnosis not present

## 2020-08-16 DIAGNOSIS — I2510 Atherosclerotic heart disease of native coronary artery without angina pectoris: Secondary | ICD-10-CM

## 2020-08-16 DIAGNOSIS — E782 Mixed hyperlipidemia: Secondary | ICD-10-CM

## 2020-08-16 NOTE — Progress Notes (Signed)
Clinical Summary Craig Taylor is a 80 y.o.male seen today for follow up of the following medical problems. Last seen by Dr Bronson Ing, this is our first visit together.   1. CAD - history of RCA stent in 2003 - no recent chest pains. No SOB or DOE -compliant with meds   2. HTN - compliant with meds  3. Hyperlipidemia - compliant with statin.  - labs followed pcp    Past Medical History:  Diagnosis Date  . Benign localized prostatic hyperplasia with lower urinary tract symptoms (LUTS)   . Bladder cancer [facility])    urologist--- dr winter---  s/p  TURBT 09/ 2020  . CKD (chronic kidney disease), stage III    neprologist--- dr Lowanda Foster  (12-15-2019  per pt was released to pcp at [facility] 02-06-2018 scanned in epic under media  . Coronary artery disease cardiologist--- dr Bronson Ing   hx MI in 2003--- 08-02-2002  s/p cardiac cath with PCI and DES to [facility] and PDA;  cath 10-04-2002 patent stents w/ 30-40% AV groove Cx with ef 50% and mild inferior hypokinesis  . COVID-19 12/16/2019   asymptomatic  . Full dentures   . Glaucoma, right eye   . History of basal cell carcinoma (BCC) excision   . History of gout    per pt one episode 01/ 2021   . History of kidney stones   . History of MI (myocardial infarction) 08/01/2002  . History of ulcer of large intestine    per pt approx. 2002 told due to oral voltaren , colonoscopy/ egd with no intervention,  but did have blood transfusions  . Hyperlipidemia   . Hypertension    followed by pcp   (nuclear stress test in epic 05-11-2018  low risk w/ no ischemia, nuclear ef 55%)  . IDA (iron deficiency anemia)   . Nephrolithiasis   . Non-insulin dependent type 2 diabetes mellitus ([place])    followed by pcp  (12-15-2019   per pt checks blood sugar 3 times a week,  fasting sugar-- 120-140)  . S/P drug eluting coronary stent placement 08/02/2002   to proximal RCA and PDA  . Wears glasses      Allergies  Allergen Reactions  . Lipitor  [Atorvastatin]     pruritis  . Voltaren [Diclofenac Sodium] Other (See Comments)    "caused intestine ulcers with bleeding"  . Penicillins Rash    Has patient had a PCN reaction causing immediate rash, facial/tongue/throat swelling, SOB or lightheadedness with hypotension: No Has patient had a PCN reaction causing severe rash involving mucus membranes or skin necrosis: No Has patient had a PCN reaction that required hospitalization: No Has patient had a PCN reaction occurring within the last 10 years: No If all of the above answers are "NO", then may proceed with Cephalosporin use.      Current Outpatient Medications  Medication Sig Dispense Refill  . aspirin EC 81 MG tablet Take 81 mg by mouth daily.    . Cholecalciferol (VITAMIN D-3) 125 MCG (5000 UT) TABS Take 1 tablet by mouth at bedtime.     . Cinnamon 500 MG TABS Take 1 tablet by mouth 2 (two) times daily.     . enalapril (VASOTEC) 2.5 MG tablet Take 2.5 mg by mouth daily.     . metFORMIN (GLUCOPHAGE) 500 MG tablet Take 1,000 mg by mouth 2 (two) times daily with a meal.     . metoprolol tartrate (LOPRESSOR) 50 MG tablet Take 1 tablet (50  mg total) by mouth 2 (two) times daily. 180 tablet 1  . nitroGLYCERIN (NITROSTAT) 0.4 MG SL tablet Place 1 tablet (0.4 mg total) under the tongue every 5 (five) minutes x 3 doses as needed for chest pain (if no relief after 2nd dose, proceed to the ED for an evaluation or call 911). 25 tablet 1  . Omega-3 Fatty Acids (FISH OIL PO) Take by mouth at bedtime.     Marland Kitchen oxybutynin (DITROPAN) 5 MG tablet Take 1 tablet (5 mg total) by mouth every 8 (eight) hours as needed for bladder spasms. 30 tablet 1  . phenazopyridine (PYRIDIUM) 200 MG tablet Take 1 tablet (200 mg total) by mouth 3 (three) times daily as needed (for pain with urination). 30 tablet 0  . ROCKLATAN 0.02-0.005 % SOLN Place 1 drop into the right eye 2 (two) times a day.   11  . simvastatin (ZOCOR) 40 MG tablet TAKE 1 TABLET BY MOUTH ONCE DAILY  (DOSE  INCREASE  9/9) (Patient taking differently: Take 40 mg by mouth every evening. TAKE 1 TABLET BY MOUTH ONCE DAILY (DOSE  INCREASE  9/9)) 90 tablet 2  . SODIUM BICARBONATE PO Take 1 tablet by mouth at bedtime. 10gr tab    . tamsulosin (FLOMAX) 0.4 MG CAPS capsule Take 0.4 mg by mouth daily.      No current facility-administered medications for this visit.     Past Surgical History:  Procedure Laterality Date  . BIOPSY  07/13/2018   Procedure: BIOPSY;  Surgeon: Corbin Ade, MD;  [facility];  Service: Endoscopy;;  (gastric)  . CARDIAC CATHETERIZATION  08/02/2002   dr berry   Crux of RCA 95% stenosis, stented with a 2.5x32mm CYPHER stent resulting in reduction of 95% stenosis to 0% residual; overlapping CYPHER stents -3x72mm and 3x39mm- deployed at 15atm resulting in reduction of 70% stenosis to 0% residual.  . CARDIAC CATHETERIZATION  10/04/2002    dr berry   patent stents w/ 30-40% AV groove LCx , mild inferior hypokinesis, ef 50%  . COLONOSCOPY N/A 05/31/2014   Dr. Jena Gauss: Normal  . COLONOSCOPY N/A 07/13/2018   Procedure: COLONOSCOPY;  Surgeon: Corbin Ade, MD;  [facility];  Service: Endoscopy;  Laterality: N/A;  8:30am  . ESOPHAGOGASTRODUODENOSCOPY N/A 07/13/2018   Procedure: ESOPHAGOGASTRODUODENOSCOPY (EGD);  Surgeon: Corbin Ade, MD;  [facility];  Service: Endoscopy;  Laterality: N/A;  . TRANSURETHRAL RESECTION OF BLADDER TUMOR N/A 04/22/2019   Procedure: TRANSURETHRAL RESECTION OF BLADDER TUMOR (TURBT)/ CYSTOSCOPY BILATERAL RETROGRADE PYELOGRAMS,  GEMCITABINE INSTILLATION;  Surgeon: Rene Paci, MD;  Location: [facility];  Service: Urology;  Laterality: N/A;  ONLY NEEDS 45 MIN  . TRANSURETHRAL RESECTION OF BLADDER TUMOR N/A 01/25/2020   Procedure: TRANSURETHRAL RESECTION OF BLADDER TUMOR (TURBT)/ CYSTOSCOPY;  Surgeon: Rene Paci, MD;  Location: [facility];  Service: Urology;   Laterality: N/A;  ONLY NEEDS 30 MIN     Allergies  Allergen Reactions  . Lipitor [Atorvastatin]     pruritis  . Voltaren [Diclofenac Sodium] Other (See Comments)    "caused intestine ulcers with bleeding"  . Penicillins Rash    Has patient had a PCN reaction causing immediate rash, facial/tongue/throat swelling, SOB or lightheadedness with hypotension: No Has patient had a PCN reaction causing severe rash involving mucus membranes or skin necrosis: No Has patient had a PCN reaction that required hospitalization: No Has patient had a PCN reaction occurring within the last 10  years: No If all of the above answers are "NO", then may proceed with Cephalosporin use.       Family History  Problem Relation Age of Onset  . Stroke Mother   . Diabetes Mother   . Colon cancer Neg Hx      Social History Mr. Limas reports that he quit smoking about 37 years ago. His smoking use included cigarettes. He started smoking about 63 years ago. He has a 42.00 pack-year smoking history. He quit smokeless tobacco use about 8 years ago.  His smokeless tobacco use included chew. Mr. Heis reports no history of alcohol use.   Review of Systems CONSTITUTIONAL: No weight loss, fever, chills, weakness or fatigue.  HEENT: Eyes: No visual loss, blurred vision, double vision or yellow sclerae.No hearing loss, sneezing, congestion, runny nose or sore throat.  SKIN: No rash or itching.  CARDIOVASCULAR: per hpi RESPIRATORY: No shortness of breath, cough or sputum.  GASTROINTESTINAL: No anorexia, nausea, vomiting or diarrhea. No abdominal pain or blood.  GENITOURINARY: No burning on urination, no polyuria NEUROLOGICAL: No headache, dizziness, syncope, paralysis, ataxia, numbness or tingling in the extremities. No change in bowel or bladder control.  MUSCULOSKELETAL: No muscle, back pain, joint pain or stiffness.  LYMPHATICS: No enlarged nodes. No history of splenectomy.  PSYCHIATRIC: No history of  depression or anxiety.  ENDOCRINOLOGIC: No reports of sweating, cold or heat intolerance. No polyuria or polydipsia.  Marland Kitchen   Physical Examination Today's Vitals   08/16/20 0836  BP: 120/70  Pulse: (!) 56  Resp: 16  SpO2: 98%  Weight: 247 lb 9.6 oz (112.3 kg)  Height: 6\' 6"  (1.981 m)   Body mass index is 28.61 kg/m.  Gen: resting comfortably, no acute distress HEENT: no scleral icterus, pupils equal round and reactive, no palptable cervical adenopathy,  CV: RRR, no m/r/g, no jvd Resp: Clear to auscultation bilaterally GI: abdomen is soft, non-tender, non-distended, normal bowel sounds, no hepatosplenomegaly MSK: extremities are warm, no edema.  Skin: warm, no rash Neuro:  no focal deficits Psych: appropriate affect   Diagnostic Studies  Stress test 05/11/18:   Blood pressure demonstrated a hypertensive response to exercise.  There was no ST segment deviation noted during stress.  Defect 1: There is a medium defect of moderate severity present in the mid inferoseptal, mid inferior and apical inferior location. This is consistent with myocardial scar as well as overlying soft tissue attenuation. There are no significant ischemic territories.  This is a low risk study.  Nuclear stress EF: 55%.    Assessment and Plan  1. CAD - no recent symptoms, continue current meds  2. HTN - at goal, continue current meds  3. Hyperlipidemia - continue statni, request labs from pcp        Arnoldo Lenis, M.D.

## 2020-08-16 NOTE — Patient Instructions (Signed)
Medication Instructions:   Your physician recommends that you continue on your current medications as directed. Please refer to the Current Medication list given to you today.  Labwork:  None  Testing/Procedures:  None  Follow-Up:  Your physician recommends that you schedule a follow-up appointment in: 1 year.  Any Other Special Instructions Will Be Listed Below (If Applicable).  If you need a refill on your cardiac medications before your next appointment, please call your pharmacy. 

## 2020-11-21 ENCOUNTER — Emergency Department (HOSPITAL_COMMUNITY): Payer: Medicare PPO

## 2020-11-21 ENCOUNTER — Other Ambulatory Visit: Payer: Self-pay

## 2020-11-21 ENCOUNTER — Observation Stay (HOSPITAL_COMMUNITY): Payer: Medicare PPO

## 2020-11-21 ENCOUNTER — Inpatient Hospital Stay (HOSPITAL_COMMUNITY)
Admission: EM | Admit: 2020-11-21 | Discharge: 2020-12-01 | DRG: 234 | Disposition: A | Discharge status: Home / Self Care | Payer: Medicare PPO | Attending: Cardiothoracic Surgery | Admitting: Cardiothoracic Surgery

## 2020-11-21 DIAGNOSIS — Z79899 Other long term (current) drug therapy: Secondary | ICD-10-CM

## 2020-11-21 DIAGNOSIS — M109 Gout, unspecified: Secondary | ICD-10-CM | POA: Diagnosis present

## 2020-11-21 DIAGNOSIS — E1122 Type 2 diabetes mellitus with diabetic chronic kidney disease: Secondary | ICD-10-CM | POA: Diagnosis present

## 2020-11-21 DIAGNOSIS — I252 Old myocardial infarction: Secondary | ICD-10-CM

## 2020-11-21 DIAGNOSIS — Z09 Encounter for follow-up examination after completed treatment for conditions other than malignant neoplasm: Secondary | ICD-10-CM

## 2020-11-21 DIAGNOSIS — Z833 Family history of diabetes mellitus: Secondary | ICD-10-CM

## 2020-11-21 DIAGNOSIS — N1831 Chronic kidney disease, stage 3a: Secondary | ICD-10-CM | POA: Diagnosis not present

## 2020-11-21 DIAGNOSIS — I200 Unstable angina: Secondary | ICD-10-CM

## 2020-11-21 DIAGNOSIS — Z951 Presence of aortocoronary bypass graft: Secondary | ICD-10-CM

## 2020-11-21 DIAGNOSIS — Z823 Family history of stroke: Secondary | ICD-10-CM

## 2020-11-21 DIAGNOSIS — I25110 Atherosclerotic heart disease of native coronary artery with unstable angina pectoris: Secondary | ICD-10-CM | POA: Diagnosis not present

## 2020-11-21 DIAGNOSIS — R079 Chest pain, unspecified: Secondary | ICD-10-CM | POA: Diagnosis not present

## 2020-11-21 DIAGNOSIS — E785 Hyperlipidemia, unspecified: Secondary | ICD-10-CM | POA: Diagnosis present

## 2020-11-21 DIAGNOSIS — D62 Acute posthemorrhagic anemia: Secondary | ICD-10-CM | POA: Diagnosis not present

## 2020-11-21 DIAGNOSIS — Z880 Allergy status to penicillin: Secondary | ICD-10-CM

## 2020-11-21 DIAGNOSIS — I452 Bifascicular block: Secondary | ICD-10-CM | POA: Diagnosis present

## 2020-11-21 DIAGNOSIS — I129 Hypertensive chronic kidney disease with stage 1 through stage 4 chronic kidney disease, or unspecified chronic kidney disease: Secondary | ICD-10-CM | POA: Diagnosis present

## 2020-11-21 DIAGNOSIS — Z955 Presence of coronary angioplasty implant and graft: Secondary | ICD-10-CM

## 2020-11-21 DIAGNOSIS — H409 Unspecified glaucoma: Secondary | ICD-10-CM | POA: Diagnosis present

## 2020-11-21 DIAGNOSIS — Z20822 Contact with and (suspected) exposure to covid-19: Secondary | ICD-10-CM | POA: Diagnosis present

## 2020-11-21 DIAGNOSIS — I10 Essential (primary) hypertension: Secondary | ICD-10-CM | POA: Diagnosis not present

## 2020-11-21 DIAGNOSIS — Z85828 Personal history of other malignant neoplasm of skin: Secondary | ICD-10-CM

## 2020-11-21 DIAGNOSIS — E876 Hypokalemia: Secondary | ICD-10-CM | POA: Diagnosis not present

## 2020-11-21 DIAGNOSIS — Z7982 Long term (current) use of aspirin: Secondary | ICD-10-CM

## 2020-11-21 DIAGNOSIS — N179 Acute kidney failure, unspecified: Secondary | ICD-10-CM | POA: Diagnosis present

## 2020-11-21 DIAGNOSIS — Z888 Allergy status to other drugs, medicaments and biological substances status: Secondary | ICD-10-CM

## 2020-11-21 DIAGNOSIS — E8770 Fluid overload, unspecified: Secondary | ICD-10-CM | POA: Diagnosis not present

## 2020-11-21 DIAGNOSIS — Z8551 Personal history of malignant neoplasm of bladder: Secondary | ICD-10-CM

## 2020-11-21 DIAGNOSIS — I4891 Unspecified atrial fibrillation: Secondary | ICD-10-CM | POA: Diagnosis not present

## 2020-11-21 DIAGNOSIS — Z9889 Other specified postprocedural states: Secondary | ICD-10-CM

## 2020-11-21 DIAGNOSIS — Z87891 Personal history of nicotine dependence: Secondary | ICD-10-CM

## 2020-11-21 LAB — CBC
HCT: 36.9 % — ABNORMAL LOW (ref 39.0–52.0)
Hemoglobin: 12.1 g/dL — ABNORMAL LOW (ref 13.0–17.0)
MCH: 28.5 pg (ref 26.0–34.0)
MCHC: 32.8 g/dL (ref 30.0–36.0)
MCV: 87 fL (ref 80.0–100.0)
Platelets: 258 10*3/uL (ref 150–400)
RBC: 4.24 MIL/uL (ref 4.22–5.81)
RDW: 13.2 % (ref 11.5–15.5)
WBC: 6.4 10*3/uL (ref 4.0–10.5)
nRBC: 0 % (ref 0.0–0.2)

## 2020-11-21 LAB — RESP PANEL BY RT-PCR (FLU A&B, COVID) ARPGX2
Influenza A by PCR: NEGATIVE
Influenza B by PCR: NEGATIVE
SARS Coronavirus 2 by RT PCR: NEGATIVE

## 2020-11-21 LAB — BASIC METABOLIC PANEL
Anion gap: 8 (ref 5–15)
BUN: 21 mg/dL (ref 8–23)
CO2: 21 mmol/L — ABNORMAL LOW (ref 22–32)
Calcium: 9.4 mg/dL (ref 8.9–10.3)
Chloride: 109 mmol/L (ref 98–111)
Creatinine, Ser: 1.27 mg/dL — ABNORMAL HIGH (ref 0.61–1.24)
GFR, Estimated: 58 mL/min — ABNORMAL LOW (ref >60)
Glucose, Bld: 128 mg/dL — ABNORMAL HIGH (ref 70–99)
Potassium: 4.6 mmol/L (ref 3.5–5.1)
Sodium: 138 mmol/L (ref 135–145)

## 2020-11-21 LAB — CBG MONITORING, ED: Glucose-Capillary: 117 mg/dL — ABNORMAL HIGH (ref 70–99)

## 2020-11-21 LAB — TROPONIN I (HIGH SENSITIVITY)
Troponin I (High Sensitivity): 5 ng/L (ref <18)
Troponin I (High Sensitivity): 4 ng/L (ref <18)

## 2020-11-21 LAB — GLUCOSE, CAPILLARY: Glucose-Capillary: 93 mg/dL (ref 70–99)

## 2020-11-21 LAB — HEPARIN LEVEL (UNFRACTIONATED): Heparin Unfractionated: 0.21 IU/mL — ABNORMAL LOW (ref 0.30–0.70)

## 2020-11-21 MED ORDER — HEPARIN (PORCINE) 25000 UT/250ML-% IV SOLN
1700.0000 [IU]/h | INTRAVENOUS | Status: DC
  Administered 2020-11-21: 1450 [IU]/h via INTRAVENOUS
  Filled 2020-11-21 (×2): qty 250

## 2020-11-21 MED ORDER — NITROGLYCERIN 0.4 MG SL SUBL: 0.4000 mg | SUBLINGUAL_TABLET | SUBLINGUAL | Status: DC | PRN

## 2020-11-21 MED ORDER — ONDANSETRON HCL 4 MG/2ML IJ SOLN: 4.0000 mg | Freq: Four times a day (QID) | INTRAMUSCULAR | Status: DC | PRN

## 2020-11-21 MED ORDER — TAMSULOSIN HCL 0.4 MG PO CAPS
0.4000 mg | ORAL_CAPSULE | Freq: Every day | ORAL | Status: DC
  Administered 2020-11-21 – 2020-11-30 (×10): 0.4 mg via ORAL
  Filled 2020-11-21 (×10): qty 1

## 2020-11-21 MED ORDER — SODIUM CHLORIDE 0.9% FLUSH
3.0000 mL | Freq: Two times a day (BID) | INTRAVENOUS | Status: DC
Start: 2020-11-21 — End: 2020-11-26

## 2020-11-21 MED ORDER — INSULIN ASPART 100 UNIT/ML ~~LOC~~ SOLN: 0.0000 [IU] | Freq: Every day | SUBCUTANEOUS | Status: DC

## 2020-11-21 MED ORDER — ASPIRIN EC 81 MG PO TBEC
81.0000 mg | DELAYED_RELEASE_TABLET | Freq: Every day | ORAL | Status: DC
  Administered 2020-11-22 – 2020-11-25 (×4): 81 mg via ORAL
  Filled 2020-11-21 (×4): qty 1

## 2020-11-21 MED ORDER — INSULIN ASPART 100 UNIT/ML ~~LOC~~ SOLN
0.0000 [IU] | Freq: Three times a day (TID) | SUBCUTANEOUS | Status: DC
  Administered 2020-11-23 – 2020-11-25 (×2): 2 [IU] via SUBCUTANEOUS
  Administered 2020-11-26: 1 [IU] via SUBCUTANEOUS

## 2020-11-21 MED ORDER — HEPARIN BOLUS VIA INFUSION
4000.0000 [IU] | Freq: Once | INTRAVENOUS | Status: AC
  Administered 2020-11-21: 4000 [IU] via INTRAVENOUS
  Filled 2020-11-21: qty 4000

## 2020-11-21 MED ORDER — ASPIRIN 81 MG PO CHEW
324.0000 mg | CHEWABLE_TABLET | Freq: Once | ORAL | Status: AC
  Administered 2020-11-21: 324 mg via ORAL
  Filled 2020-11-21: qty 4

## 2020-11-21 MED ORDER — DORZOLAMIDE HCL-TIMOLOL MAL 2-0.5 % OP SOLN
1.0000 [drp] | Freq: Two times a day (BID) | OPHTHALMIC | Status: DC
  Administered 2020-11-22 – 2020-12-01 (×18): 1 [drp] via OPHTHALMIC
  Filled 2020-11-21 (×4): qty 10

## 2020-11-21 MED ORDER — SODIUM CHLORIDE 0.9 % WEIGHT BASED INFUSION
1.0000 mL/kg/h | INTRAVENOUS | Status: DC
  Administered 2020-11-22 (×2): 1 mL/kg/h via INTRAVENOUS

## 2020-11-21 MED ORDER — SIMVASTATIN 20 MG PO TABS
40.0000 mg | ORAL_TABLET | Freq: Every evening | ORAL | Status: DC
Start: 2020-11-21 — End: 2020-11-30
  Administered 2020-11-21 – 2020-11-29 (×7): 40 mg via ORAL
  Filled 2020-11-21 (×8): qty 2

## 2020-11-21 MED ORDER — SODIUM CHLORIDE 0.9 % IV SOLN: 250.0000 mL | INTRAVENOUS | Status: DC | PRN

## 2020-11-21 MED ORDER — METOPROLOL TARTRATE 50 MG PO TABS
50.0000 mg | ORAL_TABLET | Freq: Two times a day (BID) | ORAL | Status: DC
  Administered 2020-11-21 – 2020-11-25 (×8): 50 mg via ORAL
  Filled 2020-11-21 (×8): qty 1

## 2020-11-21 MED ORDER — SODIUM CHLORIDE 0.9 % WEIGHT BASED INFUSION
3.0000 mL/kg/h | INTRAVENOUS | Status: DC
  Administered 2020-11-22: 3 mL/kg/h via INTRAVENOUS

## 2020-11-21 MED ORDER — SODIUM CHLORIDE 0.9% FLUSH: 3.0000 mL | INTRAVENOUS | Status: DC | PRN

## 2020-11-21 MED ORDER — ACETAMINOPHEN 325 MG PO TABS
650.0000 mg | ORAL_TABLET | ORAL | Status: DC | PRN
  Administered 2020-11-22 (×3): 650 mg via ORAL
  Filled 2020-11-21 (×3): qty 2

## 2020-11-21 NOTE — ED Provider Notes (Signed)
[place] EMERGENCY DEPARTMENT Provider Note   CSN: [id number] Arrival date & time: 11/21/20  1100     History Chief Complaint  Patient presents with  . Chest Pain    Craig Taylor is a 80 y.o. male.  HPI  Patient presents with chest pain.  In his upper chest and goes to his lower neck.  Comes with exertion.  Has had for around the last week now.  States it comes on when he tries to exert himself.  States before 2 weeks ago he would not have pain like this.  States the pain does feel like his previous heart attack.  Had a heart cath with stent in 2003.  No fevers or chills.  No cough.  No swelling his legs.  States he cannot do nearly the amount of activity now that he could 2 weeks ago.  States after he takes a break the pain will go away.    Past Medical History:  Diagnosis Date  . Benign localized prostatic hyperplasia with lower urinary tract symptoms (LUTS)   . Bladder cancer [facility])    urologist--- dr winter---  s/p  TURBT 09/ 2020  . CKD (chronic kidney disease), stage III ([place])    neprologist--- dr Lowanda Foster  (12-15-2019  per pt was released to pcp at [facility] 02-06-2018 scanned in epic under media  . Coronary artery disease cardiologist--- dr Bronson Ing   hx MI in 2003--- 08-02-2002  s/p cardiac cath with PCI and DES to [facility] and PDA;  cath 10-04-2002 patent stents w/ 30-40% AV groove Cx with ef 50% and mild inferior hypokinesis  . COVID-19 12/16/2019   asymptomatic  . Full dentures   . Glaucoma, right eye   . History of basal cell carcinoma (BCC) excision   . History of gout    per pt one episode 01/ 2021   . History of kidney stones   . History of MI (myocardial infarction) 08/01/2002  . History of ulcer of large intestine    per pt approx. 2002 told due to oral voltaren , colonoscopy/ egd with no intervention,  but did have blood transfusions  . Hyperlipidemia   . Hypertension    followed by pcp   (nuclear stress test in epic 05-11-2018  low risk w/  no ischemia, nuclear ef 55%)  . IDA (iron deficiency anemia)   . Nephrolithiasis   . Non-insulin dependent type 2 diabetes mellitus ([place])    followed by pcp  (12-15-2019   per pt checks blood sugar 3 times a week,  fasting sugar-- 120-140)  . S/P drug eluting coronary stent placement 08/02/2002   to proximal RCA and PDA  . Wears glasses     Patient Active Problem List   Diagnosis Date Noted  . Unstable angina ([place]) 11/21/2020  . IDA (iron deficiency anemia) 05/24/2018  . B12 deficiency 05/24/2018  . Constipation 05/24/2018  . Urinary tract infectious disease 06/15/2015  . UTI (lower urinary tract infection) 06/15/2015  . Essential hypertension 08/08/2013  . Hyperlipidemia 08/08/2013  . Type 2 diabetes mellitus ([place]) 08/08/2013  . Coronary artery disease 08/08/2013  . Pain in joint, lower leg 11/11/2011  . Stiffness of joint, not elsewhere classified, lower leg 11/11/2011    Past Surgical History:  Procedure Laterality Date  . BIOPSY  07/13/2018   Procedure: BIOPSY;  Surgeon: Daneil Dolin, MD;  [facility];  Service: Endoscopy;;  (gastric)  . CARDIAC CATHETERIZATION  08/02/2002   dr berry  Crux of RCA 95% stenosis, stented with a 2.5x55mm CYPHER stent resulting in reduction of 95% stenosis to 0% residual; overlapping CYPHER stents -3x52mm and 3x47mm- deployed at 15atm resulting in reduction of 70% stenosis to 0% residual.  . CARDIAC CATHETERIZATION  10/04/2002    dr berry   patent stents w/ 30-40% AV groove LCx , mild inferior hypokinesis, ef 50%  . COLONOSCOPY N/A 05/31/2014   Dr. Gala Romney: Normal  . COLONOSCOPY N/A 07/13/2018   Procedure: COLONOSCOPY;  Surgeon: Daneil Dolin, MD;  [facility];  Service: Endoscopy;  Laterality: N/A;  8:30am  . ESOPHAGOGASTRODUODENOSCOPY N/A 07/13/2018   Procedure: ESOPHAGOGASTRODUODENOSCOPY (EGD);  Surgeon: Daneil Dolin, MD;  [facility];  Service: Endoscopy;  Laterality: N/A;  . TRANSURETHRAL RESECTION OF  BLADDER TUMOR N/A 04/22/2019   Procedure: TRANSURETHRAL RESECTION OF BLADDER TUMOR (TURBT)/ CYSTOSCOPY BILATERAL RETROGRADE PYELOGRAMS,  GEMCITABINE INSTILLATION;  Surgeon: Ceasar Mons, MD;  Location: [facility];  Service: Urology;  Laterality: N/A;  ONLY NEEDS 45 MIN  . TRANSURETHRAL RESECTION OF BLADDER TUMOR N/A 01/25/2020   Procedure: TRANSURETHRAL RESECTION OF BLADDER TUMOR (TURBT)/ CYSTOSCOPY;  Surgeon: Ceasar Mons, MD;  Location[facility];  Service: Urology;  Laterality: N/A;  ONLY NEEDS 30 MIN       Family History  Problem Relation Age of Onset  . Stroke Mother   . Diabetes Mother   . Colon cancer Neg Hx     Social History   Tobacco Use  . Smoking status: Former Smoker    Packs/day: 2.00    Years: 21.00    Pack years: 42.00    Types: Cigarettes    Start date: 07/04/1957    Quit date: 08/09/1983    Years since quitting: 37.3  . Smokeless tobacco: Former Systems developer    Types: Chew    Quit date: 08/11/2012  Vaping Use  . Vaping Use: Never used  Substance Use Topics  . Alcohol use: No    Alcohol/week: 0.0 standard drinks  . Drug use: Never    Home Medications Prior to Admission medications   Medication Sig Start Date End Date Taking? Authorizing Provider  aspirin EC 81 MG tablet Take 81 mg by mouth daily.    [provider]  Cholecalciferol (VITAMIN D-3) 125 MCG (5000 UT) TABS Take 1 tablet by mouth at bedtime.     [provider]  Cinnamon 500 MG TABS Take 1 tablet by mouth 2 (two) times daily.     [provider]  dorzolamide-timolol (COSOPT) 22.3-6.8 MG/ML ophthalmic solution Place 1 drop into the right eye in the morning and at bedtime. 08/14/20 08/14/21  [provider]  enalapril (VASOTEC) 2.5 MG tablet Take 2.5 mg by mouth daily.     [provider]  metoprolol tartrate (LOPRESSOR) 50 MG tablet Take 1 tablet (50 mg total) by mouth 2 (two) times daily. 05/31/20   Verta Ellen., NP  nitroGLYCERIN (NITROSTAT) 0.4 MG SL tablet Place 1 tablet (0.4 mg total) under the tongue every 5 (five) minutes x 3 doses as needed for chest pain (if no relief after 2nd dose, proceed to the ED for an evaluation or call 911). 06/18/20   Verta Ellen., NP  Omega-3 Fatty Acids (FISH OIL PO) Take by mouth at bedtime.     [provider]  oxybutynin (DITROPAN) 5 MG tablet Take 1 tablet (5 mg total) by mouth every 8 (eight) hours as needed for  bladder spasms. 01/25/20   Ceasar Mons, MD  phenazopyridine (PYRIDIUM) 200 MG tablet Take 1 tablet (200 mg total) by mouth 3 (three) times daily as needed (for pain with urination). 01/25/20 01/24/21  Ceasar Mons, MD  ROCKLATAN 0.02-0.005 % SOLN Place 1 drop into the right eye 2 (two) times a day.  05/28/18   [provider]  sildenafil (VIAGRA) 100 MG tablet Take 100 mg by mouth at bedtime. 06/18/20   [provider]  simvastatin (ZOCOR) 40 MG tablet TAKE 1 TABLET BY MOUTH ONCE DAILY (DOSE  INCREASE  9/9) Patient taking differently: Take 40 mg by mouth every evening. TAKE 1 TABLET BY MOUTH ONCE DAILY (DOSE  INCREASE  9/9) 11/07/19   Herminio Commons, MD  SODIUM BICARBONATE PO Take 1 tablet by mouth at bedtime. 10gr tab    [provider]  tamsulosin (FLOMAX) 0.4 MG CAPS capsule Take 0.4 mg by mouth daily.  07/22/19   [provider]    Allergies    Lipitor [atorvastatin], Voltaren [diclofenac sodium], and Penicillins  Review of Systems   Review of Systems  Constitutional: Negative for appetite change and fever.  HENT: Negative for congestion.   Respiratory: Positive for shortness of breath.   Cardiovascular: Positive for chest pain.  Gastrointestinal: Negative for abdominal pain.  Genitourinary: Negative for flank pain.  Musculoskeletal: Negative for back pain.  Skin: Negative for rash.  Neurological: Negative for weakness.  Psychiatric/Behavioral: Negative  for confusion.    Physical Exam Updated Vital Signs BP 127/68   Pulse (!) 56   Temp 98.3 F (36.8 C) (Oral)   Resp 16   Ht 6\' 6"  (1.981 m)   Wt 112 kg   SpO2 96%   BMI 28.54 kg/m   Physical Exam Vitals and nursing note reviewed.  Constitutional:      Appearance: He is well-developed.  HENT:     Head: Normocephalic.  Cardiovascular:     Rate and Rhythm: Normal rate and regular rhythm.  Pulmonary:     Breath sounds: No wheezing or rhonchi.  Chest:     Chest wall: No tenderness.  Abdominal:     Tenderness: There is no abdominal tenderness.  Musculoskeletal:     Right lower leg: No edema.     Left lower leg: No edema.  Skin:    General: Skin is warm.     Capillary Refill: Capillary refill takes less than 2 seconds.  Neurological:     Mental Status: He is alert and oriented to person, place, and time.     ED Results / Procedures / Treatments   Labs (all labs ordered are listed, but only abnormal results are displayed) Labs Reviewed  BASIC METABOLIC PANEL - Abnormal; Notable for the following components:      Result Value   CO2 21 (*)    Glucose, Bld 128 (*)    Creatinine, Ser 1.27 (*)    GFR, Estimated 58 (*)    All other components within normal limits  CBC - Abnormal; Notable for the following components:   Hemoglobin 12.1 (*)    HCT 36.9 (*)    All other components within normal limits  CBG MONITORING, ED - Abnormal; Notable for the following components:   Glucose-Capillary 117 (*)    All other components within normal limits  RESP PANEL BY RT-PCR (FLU A&B, COVID) ARPGX2  HEPARIN LEVEL (UNFRACTIONATED)  TROPONIN I (HIGH SENSITIVITY)    EKG EKG Interpretation  Date/Time:  Wednesday November 21 2020 11:28:59 EDT Ventricular Rate:  56 PR Interval:  192 QRS Duration: 158 QT Interval:  415 QTC Calculation: 401 R Axis:   -56 Text Interpretation: Sinus rhythm RBBB and LAFB No significant change since last tracing Confirmed by Davonna Belling [phone]) on  11/21/2020 11:55:11 AM   Radiology DG Chest Portable 1 View  Result Date: 11/21/2020 CLINICAL DATA:  Chest pain EXAM: PORTABLE CHEST 1 VIEW COMPARISON:  09/12/2014 FINDINGS: Heart and mediastinal contours are within normal limits. No focal opacities or effusions. No acute bony abnormality. IMPRESSION: No active disease. Electronically Signed   By: Rolm Baptise M.D.   On: 11/21/2020 11:55    Procedures Procedures   Medications Ordered in ED Medications  heparin ADULT infusion 100 units/mL (25000 units/246mL) (1,450 Units/hr Intravenous New Bag/Given 11/21/20 1205)  aspirin chewable tablet 324 mg (324 mg Oral Given 11/21/20 1159)  heparin bolus via infusion 4,000 Units (4,000 Units Intravenous Bolus from Bag 11/21/20 1205)    ED Course  I have reviewed the triage vital signs and the nursing notes.  Pertinent labs & imaging results that were available during my care of the patient were reviewed by me and considered in my medical decision making (see chart for details).    MDM Rules/Calculators/A&P                          Patient presents with chest pain.  Upper chest and into neck.  Feels like previous pain he had with his heart attack.  Began about 2 weeks ago.  Does not occur at rest but does occur with decreasing amount of exertion.  EKG reassuring.  No STEMI.  However this is clinically an unstable angina.  Will give aspirin and heparinization.  Discussed with cardiology who will see patient..  Lab work reassuring.  Creatinine may be mildly above baseline but troponin is negative.  Story is very worrisome for unstable angina.  However is pain-free at rest at this time.  Chest x-ray does not show other abnormalities such as pneumonia or pneumothorax.  CRITICAL CARE Performed by: Davonna Belling Total critical care time: 30 minutes Critical care time was exclusive of separately billable procedures and treating other patients. Critical care was necessary to treat or prevent imminent  or life-threatening deterioration. Critical care was time spent personally by me on the following activities: development of treatment plan with patient and/or surrogate as well as nursing, discussions with consultants, evaluation of patient's response to treatment, examination of patient, obtaining history from patient or surrogate, ordering and performing treatments and interventions, ordering and review of laboratory studies, ordering and review of radiographic studies, pulse oximetry and re-evaluation of patient's condition.     Final Clinical Impression(s) / ED Diagnoses Final diagnoses:  Unstable angina [facility])    Rx / DC Orders ED Discharge Orders    None       Davonna Belling, MD 11/21/20 1250

## 2020-11-21 NOTE — Progress Notes (Signed)
[place] for heparin Indication: chest pain/ACS  Heparin Dosing Weight: 112 kg  Labs: No results for input(s): HGB, HCT, PLT, APTT, LABPROT, INR, HEPARINUNFRC, HEPRLOWMOCWT, CREATININE, CKTOTAL, CKMB, TROPONINIHS in the last 72 hours.  CrCl cannot be calculated (Patient's most recent lab result is older than the maximum 21 days allowed.).  Assessment: 89 yom presenting with CP. Pharmacy consulted to dose heparin. Patient is not on anticoagulation PTA. CBC pending. No active bleed issues documented.  Goal of Therapy:  Heparin level 0.3-0.7 units/ml Monitor platelets by anticoagulation protocol: Yes   Plan:  Heparin 4000 unit bolus Start heparin at 1450 units/hr 6hr heparin level Monitor daily CBC, s/sx bleeding F/u Cardiology plans   Arturo Morton, PharmD, BCPS Please check AMION for all [place] contact numbers Clinical Pharmacist 11/21/2020 11:35 AM

## 2020-11-21 NOTE — ED Triage Notes (Signed)
Pt report he is here due to cp. Pt reports cp started during exertion.

## 2020-11-21 NOTE — Progress Notes (Signed)
[place] for heparin Indication: chest pain/ACS  Heparin Dosing Weight: 112 kg  Labs: Recent Labs    11/21/20 1140 11/21/20 1500 11/21/20 1952  HGB 12.1*  --   --   HCT 36.9*  --   --   PLT 258  --   --   HEPARINUNFRC  --   --  0.21*  CREATININE 1.27*  --   --   TROPONINIHS 5 4  --     Estimated Creatinine Clearance: 67.5 mL/min (A) (by C-G formula based on SCr of 1.27 mg/dL (H)).  Assessment: 79 yom presenting with CP. Pharmacy consulted to dose heparin.   Initial heparin level is low at 0.21 units/mL.  No issue with heparin infusion; no bleeding per RN.  Goal of Therapy:  Heparin level 0.3-0.7 units/ml Monitor platelets by anticoagulation protocol: Yes   Plan:  Increase heparin gtt to 1700 units/hr F/U AM labs  Antonette Hendricks D. Mina Marble, PharmD, BCPS, [place] 11/21/2020, 8:34 PM

## 2020-11-21 NOTE — H&P (Addendum)
Cardiology Admission History and Physical:   Patient ID: Craig Taylor MRN: [id number]; DOB: 04/14/1941   Admission date: 11/21/2020  PCP:  [facility], [place]  Cardiologist:  Carlyle Dolly, MD  Advanced Practice Provider:  No care team member to display Electrophysiologist:  None    Chief Complaint:  Chest pain  Patient Profile:   Craig Taylor is a 80 y.o. male with a PMH of CAD s/p stent to RCA in 2003, HTN, HLD, DM type 2, bladder cancer s/p TURBT 2020, CKD stage 3a, who presented with chest pain.  History of Present Illness:   Craig Taylor reports experiencing intermittent chest pain for the past month or two. He reports over the past 1-2 weeks symptoms have been occurring with less and less activity. He describes chest pain in the upper chest/lower neck which occurs exclusively with activity and resolves with rest. He has associated mild SOB, though otherwise denies radiation of pain, diaphoresis, dizziness, lightheadedness, syncope, palpitations, or N/V. He states symptoms feel consistent with his prior MI prompting him to present to the ED for further evaluation.   He was last evaluated by cardiology at an outpatient visit with Dr. Harl Bowie 08/2020, at which time he had no anginal complaints. No medication changes occurred and he was recommended to follow-up in 1 year. His last ischemic evaluation was a NST in 2019 showed evidence of prior MI in the inferoseptal/apical region without ischemia. Last echo in 2007 showed EF 50-55%, G1DD, inferior hypokinesis, moderate-severe LAE, and mild MR.   At the time of this evaluation he is chest pain free. He reports last episode of chest pain was in the evening of 11/20/20. He denies recent orthopnea, PND, or LE edema. No recent fevers, coughs, colds, abdominal pain, or urinary complaints. He reports compliance with his medications.   ED course: bradycardic in the upper 50s, otherwise VS normal.  Labs notable for electrolytes wnl, Cr 1.27, Hgb 12.1. PLT 258, HsTrop 5. COVID-19 negative. EKG showed sinus bradycardia, rate 56 bpm, chronic RBBB and LAFB, no STE/D; no significant change from previous. CXR without acute findings. He was given aspirin 324mg  and started on a heparin gtt. Cardiology asked to evaluate.   Past Medical History:  Diagnosis Date  . Benign localized prostatic hyperplasia with lower urinary tract symptoms (LUTS)   . Bladder cancer [facility])    urologist--- dr winter---  s/p  TURBT 09/ 2020  . CKD (chronic kidney disease), stage III ([place])    neprologist--- dr Lowanda Foster  (12-15-2019  per pt was released to pcp at [facility] 02-06-2018 scanned in epic under media  . Coronary artery disease cardiologist--- dr Bronson Ing   hx MI in 2003--- 08-02-2002  s/p cardiac cath with PCI and DES to [facility] and PDA;  cath 10-04-2002 patent stents w/ 30-40% AV groove Cx with ef 50% and mild inferior hypokinesis  . COVID-19 12/16/2019   asymptomatic  . Full dentures   . Glaucoma, right eye   . History of basal cell carcinoma (BCC) excision   . History of gout    per pt one episode 01/ 2021   . History of kidney stones   . History of MI (myocardial infarction) 08/01/2002  . History of ulcer of large intestine    per pt approx. 2002 told due to oral voltaren , colonoscopy/ egd with no intervention,  but did have blood transfusions  . Hyperlipidemia   . Hypertension    followed by pcp   (  nuclear stress test in epic 05-11-2018  low risk w/ no ischemia, nuclear ef 55%)  . IDA (iron deficiency anemia)   . Nephrolithiasis   . Non-insulin dependent type 2 diabetes mellitus ([place])    followed by pcp  (12-15-2019   per pt checks blood sugar 3 times a week,  fasting sugar-- 120-140)  . S/P drug eluting coronary stent placement 08/02/2002   to proximal RCA and PDA  . Wears glasses     Past Surgical History:  Procedure Laterality Date  . BIOPSY  07/13/2018   Procedure: BIOPSY;  Surgeon: Daneil Dolin, MD;  [facility];  Service: Endoscopy;;  (gastric)  . CARDIAC CATHETERIZATION  08/02/2002   dr berry   Crux of RCA 95% stenosis, stented with a 2.5x63mm CYPHER stent resulting in reduction of 95% stenosis to 0% residual; overlapping CYPHER stents -3x73mm and 3x56mm- deployed at 15atm resulting in reduction of 70% stenosis to 0% residual.  . CARDIAC CATHETERIZATION  10/04/2002    dr berry   patent stents w/ 30-40% AV groove LCx , mild inferior hypokinesis, ef 50%  . COLONOSCOPY N/A 05/31/2014   Dr. Gala Romney: Normal  . COLONOSCOPY N/A 07/13/2018   Procedure: COLONOSCOPY;  Surgeon: Daneil Dolin, MD;  [facility];  Service: Endoscopy;  Laterality: N/A;  8:30am  . ESOPHAGOGASTRODUODENOSCOPY N/A 07/13/2018   Procedure: ESOPHAGOGASTRODUODENOSCOPY (EGD);  Surgeon: Daneil Dolin, MD;  [facility];  Service: Endoscopy;  Laterality: N/A;  . TRANSURETHRAL RESECTION OF BLADDER TUMOR N/A 04/22/2019   Procedure: TRANSURETHRAL RESECTION OF BLADDER TUMOR (TURBT)/ CYSTOSCOPY BILATERAL RETROGRADE PYELOGRAMS,  GEMCITABINE INSTILLATION;  Surgeon: Ceasar Mons, MD;  Location: [facility];  Service: Urology;  Laterality: N/A;  ONLY NEEDS 45 MIN  . TRANSURETHRAL RESECTION OF BLADDER TUMOR N/A 01/25/2020   Procedure: TRANSURETHRAL RESECTION OF BLADDER TUMOR (TURBT)/ CYSTOSCOPY;  Surgeon: Ceasar Mons, MD;  Location: [facility];  Service: Urology;  Laterality: N/A;  ONLY NEEDS 30 MIN     Medications Prior to Admission: Prior to Admission medications   Medication Sig Start Date End Date Taking? Authorizing Provider  aspirin EC 81 MG tablet Take 81 mg by mouth daily.    [provider]  Cholecalciferol (VITAMIN D-3) 125 MCG (5000 UT) TABS Take 1 tablet by mouth at bedtime.     [provider]  Cinnamon 500 MG TABS Take 1 tablet by mouth 2 (two) times daily.     [provider]  dorzolamide-timolol  (COSOPT) 22.3-6.8 MG/ML ophthalmic solution Place 1 drop into the right eye in the morning and at bedtime. 08/14/20 08/14/21  [provider]  enalapril (VASOTEC) 2.5 MG tablet Take 2.5 mg by mouth daily.     [provider]  metoprolol tartrate (LOPRESSOR) 50 MG tablet Take 1 tablet (50 mg total) by mouth 2 (two) times daily. 05/31/20   Verta Ellen., NP  nitroGLYCERIN (NITROSTAT) 0.4 MG SL tablet Place 1 tablet (0.4 mg total) under the tongue every 5 (five) minutes x 3 doses as needed for chest pain (if no relief after 2nd dose, proceed to the ED for an evaluation or call 911). 06/18/20   Verta Ellen., NP  Omega-3 Fatty Acids (FISH OIL PO) Take by mouth at bedtime.     [provider]  oxybutynin (DITROPAN) 5 MG tablet Take 1 tablet (5 mg total) by mouth every 8 (eight) hours as needed for bladder spasms. 01/25/20  Ceasar Mons, MD  phenazopyridine (PYRIDIUM) 200 MG tablet Take 1 tablet (200 mg total) by mouth 3 (three) times daily as needed (for pain with urination). 01/25/20 01/24/21  Ceasar Mons, MD  ROCKLATAN 0.02-0.005 % SOLN Place 1 drop into the right eye 2 (two) times a day.  05/28/18   [provider]  sildenafil (VIAGRA) 100 MG tablet Take 100 mg by mouth at bedtime. 06/18/20   [provider]  simvastatin (ZOCOR) 40 MG tablet TAKE 1 TABLET BY MOUTH ONCE DAILY (DOSE  INCREASE  9/9) Patient taking differently: Take 40 mg by mouth every evening. TAKE 1 TABLET BY MOUTH ONCE DAILY (DOSE  INCREASE  9/9) 11/07/19   Herminio Commons, MD  SODIUM BICARBONATE PO Take 1 tablet by mouth at bedtime. 10gr tab    [provider]  tamsulosin (FLOMAX) 0.4 MG CAPS capsule Take 0.4 mg by mouth daily.  07/22/19   [provider]     Allergies:    Allergies  Allergen Reactions  . Lipitor [Atorvastatin]     pruritis  . Voltaren [Diclofenac Sodium] Other (See Comments)    "caused intestine ulcers with  bleeding"  . Penicillins Rash    Has patient had a PCN reaction causing immediate rash, facial/tongue/throat swelling, SOB or lightheadedness with hypotension: No Has patient had a PCN reaction causing severe rash involving mucus membranes or skin necrosis: No Has patient had a PCN reaction that required hospitalization: No Has patient had a PCN reaction occurring within the last 10 years: No If all of the above answers are "NO", then may proceed with Cephalosporin use.     Social History:   Social History   Socioeconomic History  . Marital status: Widowed    Spouse name: Not on file  . Number of children: Not on file  . Years of education: Not on file  . Highest education level: Not on file  Occupational History  . Not on file  Tobacco Use  . Smoking status: Former Smoker    Packs/day: 2.00    Years: 21.00    Pack years: 42.00    Types: Cigarettes    Start date: 07/04/1957    Quit date: 08/09/1983    Years since quitting: 37.3  . Smokeless tobacco: Former Systems developer    Types: Chew    Quit date: 08/11/2012  Vaping Use  . Vaping Use: Never used  Substance and Sexual Activity  . Alcohol use: No    Alcohol/week: 0.0 standard drinks  . Drug use: Never  . Sexual activity: Never  Other Topics Concern  . Not on file  Social History Narrative  . Not on file   Social Determinants of Health   Financial Resource Strain: Not on file  Food Insecurity: Not on file  Transportation Needs: Not on file  Physical Activity: Not on file  Stress: Not on file  Social Connections: Not on file  Intimate Partner Violence: Not on file    Family History:   The patient's family history includes Diabetes in his mother; Stroke in his mother. There is no history of Colon cancer.    ROS:  Please see the history of present illness.  All other ROS reviewed and negative.     Physical Exam/Data:   Vitals:   11/21/20 1129 11/21/20 1145 11/21/20 1200 11/21/20 1330  BP: 115/69 121/67 127/68 132/70   Pulse: (!) 58 (!) 58 (!) 56 (!) 56  Resp: 18 18 16 15   Temp: 98.3 F (  36.8 C)     TempSrc: Oral     SpO2: 97% 96% 96% 97%  Weight: 112 kg     Height: 6\' 6"  (1.981 m)      No intake or output data in the 24 hours ending 11/21/20 1352 Last 3 Weights 11/21/2020 08/16/2020 01/25/2020  Weight (lbs) 247 lb 247 lb 9.6 oz 246 lb 4.8 oz  Weight (kg) 112.038 kg 112.311 kg 111.721 kg     Body mass index is 28.54 kg/m.  General:  Well nourished, well developed, in no acute distress HEENT: sclera anicteric Neck: no JVD Vascular: No carotid bruits; distal pulses 2+ bilaterally Cardiac:  normal S1, S2; bradycardic, regular rhythm; no murmurs, rubs, or gallops Lungs:  clear to auscultation bilaterally, no wheezing, rhonchi or rales  Abd: soft, obese, nontender, no hepatomegaly  Ext: no edema Musculoskeletal:  No deformities, BUE and BLE strength normal and equal Skin: warm and dry  Neuro:  CNs 2-12 intact, no focal abnormalities noted Psych:  Normal affect    EKG: sinus bradycardia, rate 56 bpm, chronic RBBB and LAFB, no STE/D; no significant change from previous.   Relevant CV Studies: NST 2019:  Blood pressure demonstrated a hypertensive response to exercise.  There was no ST segment deviation noted during stress.  Defect 1: There is a medium defect of moderate severity present in the mid inferoseptal, mid inferior and apical inferior location. This is consistent with myocardial scar as well as overlying soft tissue attenuation. There are no significant ischemic territories.  This is a low risk study.  Nuclear stress EF: 55%.   Laboratory Data:  High Sensitivity Troponin:   Recent Labs  Lab 11/21/20 1140  TROPONINIHS 5      Chemistry Recent Labs  Lab 11/21/20 1140  NA 138  K 4.6  CL 109  CO2 21*  GLUCOSE 128*  BUN 21  CREATININE 1.27*  CALCIUM 9.4  GFRNONAA 58*  ANIONGAP 8    No results for input(s): PROT, ALBUMIN, AST, ALT, ALKPHOS, BILITOT in the last 168  hours. Hematology Recent Labs  Lab 11/21/20 1140  WBC 6.4  RBC 4.24  HGB 12.1*  HCT 36.9*  MCV 87.0  MCH 28.5  MCHC 32.8  RDW 13.2  PLT 258   BNPNo results for input(s): BNP, PROBNP in the last 168 hours.  DDimer No results for input(s): DDIMER in the last 168 hours.   Radiology/Studies:  DG Chest Portable 1 View  Result Date: 11/21/2020 CLINICAL DATA:  Chest pain EXAM: PORTABLE CHEST 1 VIEW COMPARISON:  09/12/2014 FINDINGS: Heart and mediastinal contours are within normal limits. No focal opacities or effusions. No acute bony abnormality. IMPRESSION: No active disease. Electronically Signed   By: Rolm Baptise M.D.   On: 11/21/2020 11:55     Assessment and Plan:   1. Unstable angina in patient with CAD s/p stent to RCA in 2003: patient presented with intermittent chest pain, worse with exertion, with associated radiation of pain to neck and exertional fatigue. He reported symptoms felt similar to prior MI resulting in stenting to RCA in 2003. EKG on arrival showed sinus brady with chronic RBBB/LAFB, though no ischemia. HsTrop is negative x1. Symptoms c/f unstable angina.  - Will plan for [facility] tomorrow to further evaluate symptoms. The patient understands that risks included but are not limited to stroke (1 in 1000), death (1 in 75), kidney failure [usually temporary] (1 in 500), bleeding (1 in 200), allergic reaction [possibly serious] (1 in 200).  The  patient understands and agrees to proceed.  - Will update an echocardiogram to re-evaluate LV function and wall motion - Continue aspirin and statin - Will check FLP/A1C for risk stratification  2. HTN: BP stable - Continue metoprolol - Will hold enalapril in anticipation of LHC tomorrow - plan to restart afterwards if Cr stable  3. HLD: no recent lipids on file - Will update FLP - Continue simvastatin for now with plans to transition to high intensity statin if LDL >70  4. DM type 2: listed in patients problem list though do  not seen any previous A1C records and patient is not on medications - Will check A1C for risk stratification  5. CKD stage 3a: Cr 1.27 today; 1.0 on last check 01/2020 - Will hydrate prior to [place] - Continue to monitor closely    Risk Assessment/Risk Scores:   TIMI Risk Score for Unstable Angina or Non-ST Elevation MI:   The patient's TIMI risk score is 5, which indicates a 26% risk of all cause mortality, new or recurrent myocardial infarction or need for urgent revascularization in the next 14 days.{       Severity of Illness: The appropriate patient status for this patient is OBSERVATION. Observation status is judged to be reasonable and necessary in order to provide the required intensity of service to ensure the patient's safety. The patient's presenting symptoms, physical exam findings, and initial radiographic and laboratory data in the context of their medical condition is felt to place them at decreased risk for further clinical deterioration. Furthermore, it is anticipated that the patient will be medically stable for discharge from the hospital within 2 midnights of admission. The following factors support the patient status of observation.   " The patient's presenting symptoms include exertional chest pain. " The physical exam findings include benign cardiopulmonary exam. " The initial radiographic and laboratory data are non-ischemic EKG and negative troponin.     For questions or updates, please contact [place] Please consult [email or web address] for contact info under     Signed, Abigail Butts, PA-C  11/21/2020 1:52 PM   Patient seen and examined.  Agree with above documentation.  Mr. Roger is a 80 year old male with a history of CAD status post RCA stent in 2003, bladder cancer, CKD stage IIIa, T2DM, hypertension, hyperlipidemia who presents with chest pain.  He reports that he has been in his usual state of health until about 2 weeks ago.  Reports that he started  having exertional chest pain at that time.  He described chest pain in center of upper chest that occurs with exertion.  States that it is now occurring with minimal exertion, such as walking across the room.  Resolves after 1 to 2 minutes with rest.  Also feels short of breath during episodes.  Reports symptoms are similar to what he had prior to his stent in 2003.  Given the symptoms, presented to ED today.  Initial vital signs notable for pulse 57, BP 115/69, SPO2 95% on room air.  Labs show creatinine 1.27, hemoglobin 12.1, platelets 258, troponin 5 >4.  Chest x-ray unremarkable.  EKG shows normal sinus rhythm, rate 56, right bundle branch block, left anterior fascicular block, no ST changes.  On exam, patient is alert and oriented, regular rate and rhythm, no murmurs, lungs CTAB, no LE edema or JVD.  For his chest pain, description suggests unstable angina, as describes worsening exertional chest pain over the last 2 weeks.  No rest pain.  Continue aspirin,  will start heparin drip.  Check echocardiogram.  Plan LHC tomorrow.  Donato Heinz, MD

## 2020-11-22 ENCOUNTER — Encounter (HOSPITAL_COMMUNITY)
Admission: EM | Disposition: A | Discharge status: Home / Self Care | Payer: Self-pay | Source: Home / Self Care | Attending: Cardiothoracic Surgery

## 2020-11-22 ENCOUNTER — Observation Stay (HOSPITAL_COMMUNITY): Payer: Medicare PPO

## 2020-11-22 DIAGNOSIS — I25110 Atherosclerotic heart disease of native coronary artery with unstable angina pectoris: Principal | ICD-10-CM

## 2020-11-22 DIAGNOSIS — E8770 Fluid overload, unspecified: Secondary | ICD-10-CM | POA: Diagnosis not present

## 2020-11-22 DIAGNOSIS — Z7982 Long term (current) use of aspirin: Secondary | ICD-10-CM | POA: Diagnosis not present

## 2020-11-22 DIAGNOSIS — I252 Old myocardial infarction: Secondary | ICD-10-CM | POA: Diagnosis not present

## 2020-11-22 DIAGNOSIS — M109 Gout, unspecified: Secondary | ICD-10-CM | POA: Diagnosis present

## 2020-11-22 DIAGNOSIS — R079 Chest pain, unspecified: Secondary | ICD-10-CM | POA: Diagnosis present

## 2020-11-22 DIAGNOSIS — Z85828 Personal history of other malignant neoplasm of skin: Secondary | ICD-10-CM | POA: Diagnosis not present

## 2020-11-22 DIAGNOSIS — Z79899 Other long term (current) drug therapy: Secondary | ICD-10-CM | POA: Diagnosis not present

## 2020-11-22 DIAGNOSIS — I200 Unstable angina: Secondary | ICD-10-CM | POA: Diagnosis not present

## 2020-11-22 DIAGNOSIS — N179 Acute kidney failure, unspecified: Secondary | ICD-10-CM | POA: Diagnosis present

## 2020-11-22 DIAGNOSIS — E7800 Pure hypercholesterolemia, unspecified: Secondary | ICD-10-CM | POA: Diagnosis not present

## 2020-11-22 DIAGNOSIS — D62 Acute posthemorrhagic anemia: Secondary | ICD-10-CM | POA: Diagnosis not present

## 2020-11-22 DIAGNOSIS — Z955 Presence of coronary angioplasty implant and graft: Secondary | ICD-10-CM | POA: Diagnosis not present

## 2020-11-22 DIAGNOSIS — Z20822 Contact with and (suspected) exposure to covid-19: Secondary | ICD-10-CM | POA: Diagnosis present

## 2020-11-22 DIAGNOSIS — Z833 Family history of diabetes mellitus: Secondary | ICD-10-CM | POA: Diagnosis not present

## 2020-11-22 DIAGNOSIS — N1831 Chronic kidney disease, stage 3a: Secondary | ICD-10-CM | POA: Diagnosis present

## 2020-11-22 DIAGNOSIS — I452 Bifascicular block: Secondary | ICD-10-CM | POA: Diagnosis present

## 2020-11-22 DIAGNOSIS — Z823 Family history of stroke: Secondary | ICD-10-CM | POA: Diagnosis not present

## 2020-11-22 DIAGNOSIS — I129 Hypertensive chronic kidney disease with stage 1 through stage 4 chronic kidney disease, or unspecified chronic kidney disease: Secondary | ICD-10-CM | POA: Diagnosis present

## 2020-11-22 DIAGNOSIS — Z951 Presence of aortocoronary bypass graft: Secondary | ICD-10-CM | POA: Diagnosis not present

## 2020-11-22 DIAGNOSIS — Z880 Allergy status to penicillin: Secondary | ICD-10-CM | POA: Diagnosis not present

## 2020-11-22 DIAGNOSIS — Z01810 Encounter for preprocedural cardiovascular examination: Secondary | ICD-10-CM | POA: Diagnosis not present

## 2020-11-22 DIAGNOSIS — E876 Hypokalemia: Secondary | ICD-10-CM | POA: Diagnosis not present

## 2020-11-22 DIAGNOSIS — I4891 Unspecified atrial fibrillation: Secondary | ICD-10-CM | POA: Diagnosis not present

## 2020-11-22 DIAGNOSIS — Z8551 Personal history of malignant neoplasm of bladder: Secondary | ICD-10-CM | POA: Diagnosis not present

## 2020-11-22 DIAGNOSIS — Z87891 Personal history of nicotine dependence: Secondary | ICD-10-CM | POA: Diagnosis not present

## 2020-11-22 DIAGNOSIS — I10 Essential (primary) hypertension: Secondary | ICD-10-CM | POA: Diagnosis not present

## 2020-11-22 DIAGNOSIS — H409 Unspecified glaucoma: Secondary | ICD-10-CM | POA: Diagnosis present

## 2020-11-22 DIAGNOSIS — E1122 Type 2 diabetes mellitus with diabetic chronic kidney disease: Secondary | ICD-10-CM | POA: Diagnosis present

## 2020-11-22 DIAGNOSIS — E785 Hyperlipidemia, unspecified: Secondary | ICD-10-CM | POA: Diagnosis present

## 2020-11-22 HISTORY — PX: LEFT HEART CATH AND CORONARY ANGIOGRAPHY: CATH118249

## 2020-11-22 LAB — CBC
HCT: 35.6 % — ABNORMAL LOW (ref 39.0–52.0)
Hemoglobin: 11.8 g/dL — ABNORMAL LOW (ref 13.0–17.0)
MCH: 28.9 pg (ref 26.0–34.0)
MCHC: 33.1 g/dL (ref 30.0–36.0)
MCV: 87 fL (ref 80.0–100.0)
Platelets: 194 10*3/uL (ref 150–400)
RBC: 4.09 MIL/uL — ABNORMAL LOW (ref 4.22–5.81)
RDW: 13.2 % (ref 11.5–15.5)
WBC: 6 10*3/uL (ref 4.0–10.5)
nRBC: 0 % (ref 0.0–0.2)

## 2020-11-22 LAB — BASIC METABOLIC PANEL
Anion gap: 7 (ref 5–15)
BUN: 20 mg/dL (ref 8–23)
CO2: 24 mmol/L (ref 22–32)
Calcium: 9.1 mg/dL (ref 8.9–10.3)
Chloride: 108 mmol/L (ref 98–111)
Creatinine, Ser: 1.21 mg/dL (ref 0.61–1.24)
GFR, Estimated: >60 mL/min (ref >60)
Glucose, Bld: 143 mg/dL — ABNORMAL HIGH (ref 70–99)
Potassium: 3.9 mmol/L (ref 3.5–5.1)
Sodium: 139 mmol/L (ref 135–145)

## 2020-11-22 LAB — LIPID PANEL
Cholesterol: 105 mg/dL (ref 0–200)
HDL: 25 mg/dL — ABNORMAL LOW (ref >40)
LDL Cholesterol: 54 mg/dL (ref 0–99)
Total CHOL/HDL Ratio: 4.2 RATIO
Triglycerides: 131 mg/dL (ref <150)
VLDL: 26 mg/dL (ref 0–40)

## 2020-11-22 LAB — GLUCOSE, CAPILLARY
Glucose-Capillary: 118 mg/dL — ABNORMAL HIGH (ref 70–99)
Glucose-Capillary: 120 mg/dL — ABNORMAL HIGH (ref 70–99)
Glucose-Capillary: 101 mg/dL — ABNORMAL HIGH (ref 70–99)
Glucose-Capillary: 175 mg/dL — ABNORMAL HIGH (ref 70–99)

## 2020-11-22 LAB — ECHOCARDIOGRAM COMPLETE
Area-P 1/2: 1.65 cm2
Height: 78 in
S' Lateral: 3.6 cm
Weight: 3776 oz

## 2020-11-22 LAB — HEPARIN LEVEL (UNFRACTIONATED)
Heparin Unfractionated: 0.37 IU/mL (ref 0.30–0.70)
Heparin Unfractionated: 0.54 IU/mL (ref 0.30–0.70)

## 2020-11-22 LAB — HEMOGLOBIN A1C
Hgb A1c MFr Bld: 6.3 % — ABNORMAL HIGH (ref 4.8–5.6)
Mean Plasma Glucose: 134.11 mg/dL

## 2020-11-22 SURGERY — LEFT HEART CATH AND CORONARY ANGIOGRAPHY
Anesthesia: LOCAL

## 2020-11-22 MED ORDER — IOHEXOL 350 MG/ML SOLN
INTRAVENOUS | Status: DC | PRN
  Administered 2020-11-22: 80 mL

## 2020-11-22 MED ORDER — HEPARIN SODIUM (PORCINE) 1000 UNIT/ML IJ SOLN
INTRAMUSCULAR | Status: AC
  Filled 2020-11-22: qty 1

## 2020-11-22 MED ORDER — LABETALOL HCL 5 MG/ML IV SOLN: 10.0000 mg | INTRAVENOUS | Status: AC | PRN

## 2020-11-22 MED ORDER — HEPARIN SODIUM (PORCINE) 1000 UNIT/ML IJ SOLN
INTRAMUSCULAR | Status: DC | PRN
  Administered 2020-11-22: 5000 [IU] via INTRAVENOUS

## 2020-11-22 MED ORDER — SODIUM CHLORIDE 0.9 % IV SOLN: INTRAVENOUS | Status: AC

## 2020-11-22 MED ORDER — HEPARIN (PORCINE) IN NACL 1000-0.9 UT/500ML-% IV SOLN
INTRAVENOUS | Status: DC | PRN
  Administered 2020-11-22 (×3): 500 mL

## 2020-11-22 MED ORDER — SODIUM CHLORIDE 0.9% FLUSH
3.0000 mL | Freq: Two times a day (BID) | INTRAVENOUS | Status: DC
  Administered 2020-11-25: 3 mL via INTRAVENOUS

## 2020-11-22 MED ORDER — HEPARIN (PORCINE) 25000 UT/250ML-% IV SOLN
1900.0000 [IU]/h | INTRAVENOUS | Status: DC
  Administered 2020-11-23: 1700 [IU]/h via INTRAVENOUS
  Filled 2020-11-22 (×5): qty 250

## 2020-11-22 MED ORDER — FENTANYL CITRATE (PF) 100 MCG/2ML IJ SOLN
INTRAMUSCULAR | Status: DC | PRN
  Administered 2020-11-22: 50 ug via INTRAVENOUS

## 2020-11-22 MED ORDER — NITROGLYCERIN 1 MG/10 ML FOR IR/CATH LAB
INTRA_ARTERIAL | Status: AC
  Filled 2020-11-22: qty 10

## 2020-11-22 MED ORDER — MIDAZOLAM HCL 2 MG/2ML IJ SOLN
INTRAMUSCULAR | Status: DC | PRN
  Administered 2020-11-22: 2 mg via INTRAVENOUS

## 2020-11-22 MED ORDER — SODIUM CHLORIDE 0.9% FLUSH: 3.0000 mL | INTRAVENOUS | Status: DC | PRN

## 2020-11-22 MED ORDER — HYDRALAZINE HCL 20 MG/ML IJ SOLN: 10.0000 mg | INTRAMUSCULAR | Status: AC | PRN

## 2020-11-22 MED ORDER — VERAPAMIL HCL 2.5 MG/ML IV SOLN
INTRAVENOUS | Status: AC
  Filled 2020-11-22: qty 2

## 2020-11-22 MED ORDER — MIDAZOLAM HCL 2 MG/2ML IJ SOLN
INTRAMUSCULAR | Status: AC
  Filled 2020-11-22: qty 2

## 2020-11-22 MED ORDER — NITROGLYCERIN 1 MG/10 ML FOR IR/CATH LAB
INTRA_ARTERIAL | Status: DC | PRN
  Administered 2020-11-22: 200 ug via INTRACORONARY

## 2020-11-22 MED ORDER — LIDOCAINE HCL (PF) 1 % IJ SOLN
INTRAMUSCULAR | Status: DC | PRN
  Administered 2020-11-22: 5 mL

## 2020-11-22 MED ORDER — SODIUM CHLORIDE 0.9 % IV SOLN: 250.0000 mL | INTRAVENOUS | Status: DC | PRN

## 2020-11-22 MED ORDER — FENTANYL CITRATE (PF) 100 MCG/2ML IJ SOLN
INTRAMUSCULAR | Status: AC
  Filled 2020-11-22: qty 2

## 2020-11-22 MED ORDER — HEPARIN (PORCINE) IN NACL 1000-0.9 UT/500ML-% IV SOLN
INTRAVENOUS | Status: AC
  Filled 2020-11-22: qty 1000

## 2020-11-22 MED ORDER — LIDOCAINE HCL (PF) 1 % IJ SOLN
INTRAMUSCULAR | Status: AC
  Filled 2020-11-22: qty 30

## 2020-11-22 MED ORDER — VERAPAMIL HCL 2.5 MG/ML IV SOLN
INTRAVENOUS | Status: DC | PRN
  Administered 2020-11-22: 10 mL via INTRA_ARTERIAL

## 2020-11-22 SURGICAL SUPPLY — 13 items
CATH INFINITI 5F JL4 125CM (CATHETERS) ×1 IMPLANT
CATH INFINITI 5FR JL4 (CATHETERS) ×1 IMPLANT
CATH LAUNCHER 6FR EBU3.5 (CATHETERS) ×1 IMPLANT
CATH OPTITORQUE TIG 4.0 5F (CATHETERS) ×1 IMPLANT
CATH VISTA GUIDE 6FR XBLAD3.5 (CATHETERS) ×1 IMPLANT
DEVICE RAD COMP TR BAND LRG (VASCULAR PRODUCTS) ×1 IMPLANT
GLIDESHEATH SLEND SS 6F .021 (SHEATH) ×1 IMPLANT
GUIDEWIRE INQWIRE 1.5J.035X260 (WIRE) IMPLANT
INQWIRE 1.5J .035X260CM (WIRE) ×2
KIT HEART LEFT (KITS) ×2 IMPLANT
PACK CARDIAC CATHETERIZATION (CUSTOM PROCEDURE TRAY) ×2 IMPLANT
TRANSDUCER W/STOPCOCK (MISCELLANEOUS) ×2 IMPLANT
TUBING CIL FLEX 10 FLL-RA (TUBING) ×2 IMPLANT

## 2020-11-22 NOTE — Progress Notes (Signed)
ANTICOAGULATION CONSULT NOTE  Pharmacy Consult for heparin Indication: chest pain/ACS  Heparin Dosing Weight: 112 kg  Labs: Recent Labs    11/21/20 1140 11/21/20 1500 11/21/20 1952 11/22/20 0140 11/22/20 1155  HGB 12.1*  --   --  11.8*  --   HCT 36.9*  --   --  35.6*  --   PLT 258  --   --  194  --   HEPARINUNFRC  --   --  0.21* 0.37 0.54  CREATININE 1.27*  --   --  1.21  --   TROPONINIHS 5 4  --   --   --     Estimated Creatinine Clearance: 65 mL/min (by C-G formula based on SCr of 1.21 mg/dL).  Assessment: 2 yom presenting with CP. Pharmacy consulted to dose heparin. Plans noted for cath today -heparin level at goal  Goal of Therapy:  Heparin level 0.3-0.7 units/ml Monitor platelets by anticoagulation protocol: Yes   Plan:  Cont heparin drip 1700 units/hr Will follow plans post cath   Hildred Laser, PharmD Clinical Pharmacist **Pharmacist phone directory can now be found on [email or web address] ([email or web address]).  Listed under [place].

## 2020-11-22 NOTE — Progress Notes (Signed)
ANTICOAGULATION CONSULT NOTE  Pharmacy Consult for heparin Indication: chest pain/ACS  Heparin Dosing Weight: 112 kg  Labs: Recent Labs    11/21/20 1140 11/21/20 1500 11/21/20 1952 11/22/20 0140  HGB 12.1*  --   --  11.8*  HCT 36.9*  --   --  35.6*  PLT 258  --   --  194  HEPARINUNFRC  --   --  0.21* 0.37  CREATININE 1.27*  --   --  1.21  TROPONINIHS 5 4  --   --     Estimated Creatinine Clearance: 70.9 mL/min (by C-G formula based on SCr of 1.21 mg/dL).  Assessment: 3 yom presenting with CP. Pharmacy consulted to dose heparin.   Initial heparin level is low at 0.21 units/mL.  No issue with heparin infusion; no bleeding per RN.  4/14 AM update:  Heparin level therapeutic after rate increase  Goal of Therapy:  Heparin level 0.3-0.7 units/ml Monitor platelets by anticoagulation protocol: Yes   Plan:  Cont heparin drip 1700 units/hr 1200 heparin level  Narda Bonds, PharmD, BCPS Clinical Pharmacist Phone: [phone]

## 2020-11-22 NOTE — Progress Notes (Signed)
ANTICOAGULATION CONSULT NOTE - Follow Up Consult  Pharmacy Consult for IV Heparin Indication: chest pain/ACS  Heparin Dosing Weight: 112 kg  Labs: Recent Labs    11/21/20 1140 11/21/20 1500 11/21/20 1952 11/22/20 0140 11/22/20 1155  HGB 12.1*  --   --  11.8*  --   HCT 36.9*  --   --  35.6*  --   PLT 258  --   --  194  --   HEPARINUNFRC  --   --  0.21* 0.37 0.54  CREATININE 1.27*  --   --  1.21  --   TROPONINIHS 5 4  --   --   --     Estimated Creatinine Clearance: 65 mL/min (by C-G formula based on SCr of 1.21 mg/dL).  Assessment: 80 yr old Taylor presented with CP. Pharmacy was consulted to dose heparin (on no anticoagulants PTA).  Heparin level was at goal earlier today (0.54 units/ml) on heparin infusion at 1700 units/hr. H/H 11.8/35.6, plt 194.  Pt is S/P cardiac cath this afternoon, which showed severe multi-vessel disease; CVTS will be consult for cardiac surgery.  Pharmacy is consulted to resume heparin infusion 8 hrs after sheath removal (per cath lab procedure log, sheath was removed at ~1800). Per RN, no bleeding observed post cath.  Goal of Therapy:  Heparin level 0.3-0.7 units/ml Monitor platelets by anticoagulation protocol: Yes   Plan:  Resume heparin infusion at 1700 units/hr at 0200 AM on 11/23/20 (~8 hrs after sheath removal) Check heparin level 8 hrs after resuming heparin infusion Monitor daily heparin level, CBC Monitor for bleeding F/U CVTS plans  Gillermina Hu, PharmD, BCPS, [facility] Clinical Pharmacist

## 2020-11-22 NOTE — Progress Notes (Signed)
  Echocardiogram 2D Echocardiogram with 3D and strain has been performed.  Darlina Sicilian M 11/22/2020, 8:26 AM

## 2020-11-22 NOTE — Care Management Obs Status (Signed)
[facility] NOTIFICATION   Patient Details  Name: TAARIQ LEITZ MRN: [id number] Date of Birth: August 30, 1940   Medicare Observation Status Notification Given:  Yes    Dawayne Patricia, RN 11/22/2020, 4:00 PM

## 2020-11-22 NOTE — Progress Notes (Addendum)
Progress Note  Patient Name: Craig Taylor Date of Encounter: 11/22/2020  [facility] Cardiologist: Carlyle Dolly, MD   Subjective   Denies any chest pain or dyspnea  Inpatient Medications    Scheduled Meds: . aspirin EC  81 mg Oral Daily  . dorzolamide-timolol  1 drop Right Eye BID  . insulin aspart  0-5 Units Subcutaneous QHS  . insulin aspart  0-9 Units Subcutaneous TID WC  . metoprolol tartrate  50 mg Oral BID  . simvastatin  40 mg Oral QPM  . sodium chloride flush  3 mL Intravenous Q12H  . tamsulosin  0.4 mg Oral QHS   Continuous Infusions: . sodium chloride    . sodium chloride 1 mL/kg/hr (11/22/20 0500)  . heparin 1,700 Units/hr (11/21/20 2034)   PRN Meds: sodium chloride, acetaminophen, nitroGLYCERIN, ondansetron (ZOFRAN) IV, sodium chloride flush   Vital Signs    Vitals:   11/22/20 0050 11/22/20 0505 11/22/20 0823 11/22/20 0938  BP:  131/63 (!) 126/58   Pulse:  (!) 57 (!) 57 (!) 53  Resp:  14 14   Temp:  98.5 F (36.9 C) 98 F (36.7 C)   TempSrc: Oral Oral Oral   SpO2:  95% 95%   Weight:  107 kg    Height:       No intake or output data in the 24 hours ending 11/22/20 1035 Last 3 Weights 11/22/2020 11/21/2020 08/16/2020  Weight (lbs) 236 lb 247 lb 247 lb 9.6 oz  Weight (kg) 107.049 kg 112.038 kg 112.311 kg      Telemetry    Sinus rhythm with rate 60-70s- Personally Reviewed  ECG    No new ECG - Personally Reviewed  Physical Exam   GEN: No acute distress.   Neck: No JVD Cardiac:  Regular, bradycardic, no murmurs, rubs, or gallops.  Respiratory: Clear to auscultation bilaterally. GI: Soft, nontender, non-distended  MS: No edema; No deformity. Neuro:  Nonfocal  Psych: Normal affect   Labs    High Sensitivity Troponin:   Recent Labs  Lab 11/21/20 1140 11/21/20 1500  TROPONINIHS 5 4      Chemistry Recent Labs  Lab 11/21/20 1140 11/22/20 0140  NA 138 139  K 4.6 3.9  CL 109 108  CO2 21* 24  GLUCOSE 128* 143*  BUN 21  20  CREATININE 1.27* 1.21  CALCIUM 9.4 9.1  GFRNONAA 58* >60  ANIONGAP 8 7     Hematology Recent Labs  Lab 11/21/20 1140 11/22/20 0140  WBC 6.4 6.0  RBC 4.24 4.09*  HGB 12.1* 11.8*  HCT 36.9* 35.6*  MCV 87.0 87.0  MCH 28.5 28.9  MCHC 32.8 33.1  RDW 13.2 13.2  PLT 258 194    BNPNo results for input(s): BNP, PROBNP in the last 168 hours.   DDimer No results for input(s): DDIMER in the last 168 hours.   Radiology    DG Chest Portable 1 View  Result Date: 11/21/2020 CLINICAL DATA:  Chest pain EXAM: PORTABLE CHEST 1 VIEW COMPARISON:  09/12/2014 FINDINGS: Heart and mediastinal contours are within normal limits. No focal opacities or effusions. No acute bony abnormality. IMPRESSION: No active disease. Electronically Signed   By: Rolm Baptise M.D.   On: 11/21/2020 11:55    Cardiac Studies     Patient Profile     80 y.o. male with a PMH of CAD s/p stent to RCA in 2003, HTN, HLD, DM type 2, bladder cancer s/p TURBT 2020, CKD stage 3a, who presented  with chest pain.  Assessment & Plan    Unstable angina in patient with CAD s/p stent to RCA in 2003: patient presented with progressive exertional chest pain over last 2 weeks. He reported symptoms felt similar to what he had prior to stenting to RCA in 2003. EKG on arrival showed sinus brady with chronic RBBB/LAFB. HsTrop is negative x2. Presentation c/f unstable angina. Echo 4/14 shows normal systolic function.  Cath 4/14 showed distal left main 55-60% stenosis with 99% ostial LCX, 70% mid RCA - Cardic surgery consulted for CABG evaluation - Continue aspirin, heparin, and statin - Continue metoprolol  2. HTN: BP stable - Continue metoprolol - Plan to restart home enalapril if stable kidney function post cath  3. HLD:  Well-controlled on simvastatin, LDL 54  4. DM type 2: listed in patients problem list though do not seen any previous A1C records and patient is not on medications.  A1c's currently 6.3%  5. CKD stage 3a:  Cr 1.2 today; stable - Continue to monitor closely  For questions or updates, please contact [place] Please consult [email or web address] for contact info under        Signed, Donato Heinz, MD  11/22/2020, 10:35 AM

## 2020-11-23 ENCOUNTER — Inpatient Hospital Stay (HOSPITAL_COMMUNITY): Payer: Medicare PPO

## 2020-11-23 ENCOUNTER — Encounter (HOSPITAL_COMMUNITY): Payer: Self-pay | Admitting: Cardiology

## 2020-11-23 DIAGNOSIS — I200 Unstable angina: Secondary | ICD-10-CM | POA: Diagnosis not present

## 2020-11-23 DIAGNOSIS — Z01810 Encounter for preprocedural cardiovascular examination: Secondary | ICD-10-CM

## 2020-11-23 DIAGNOSIS — I10 Essential (primary) hypertension: Secondary | ICD-10-CM | POA: Diagnosis not present

## 2020-11-23 DIAGNOSIS — N1831 Chronic kidney disease, stage 3a: Secondary | ICD-10-CM | POA: Diagnosis not present

## 2020-11-23 LAB — CBC
HCT: 33.7 % — ABNORMAL LOW (ref 39.0–52.0)
Hemoglobin: 11.1 g/dL — ABNORMAL LOW (ref 13.0–17.0)
MCH: 28.6 pg (ref 26.0–34.0)
MCHC: 32.9 g/dL (ref 30.0–36.0)
MCV: 86.9 fL (ref 80.0–100.0)
Platelets: 207 10*3/uL (ref 150–400)
RBC: 3.88 MIL/uL — ABNORMAL LOW (ref 4.22–5.81)
RDW: 13.5 % (ref 11.5–15.5)
WBC: 5.7 10*3/uL (ref 4.0–10.5)
nRBC: 0 % (ref 0.0–0.2)

## 2020-11-23 LAB — BASIC METABOLIC PANEL
Anion gap: 5 (ref 5–15)
BUN: 13 mg/dL (ref 8–23)
CO2: 24 mmol/L (ref 22–32)
Calcium: 8.9 mg/dL (ref 8.9–10.3)
Chloride: 109 mmol/L (ref 98–111)
Creatinine, Ser: 1.14 mg/dL (ref 0.61–1.24)
GFR, Estimated: >60 mL/min (ref >60)
Glucose, Bld: 141 mg/dL — ABNORMAL HIGH (ref 70–99)
Potassium: 4.1 mmol/L (ref 3.5–5.1)
Sodium: 138 mmol/L (ref 135–145)

## 2020-11-23 LAB — GLUCOSE, CAPILLARY
Glucose-Capillary: 105 mg/dL — ABNORMAL HIGH (ref 70–99)
Glucose-Capillary: 172 mg/dL — ABNORMAL HIGH (ref 70–99)
Glucose-Capillary: 100 mg/dL — ABNORMAL HIGH (ref 70–99)
Glucose-Capillary: 124 mg/dL — ABNORMAL HIGH (ref 70–99)

## 2020-11-23 LAB — HEPARIN LEVEL (UNFRACTIONATED)
Heparin Unfractionated: 0.2 IU/mL — ABNORMAL LOW (ref 0.30–0.70)
Heparin Unfractionated: 0.59 IU/mL (ref 0.30–0.70)

## 2020-11-23 MED ORDER — POTASSIUM CHLORIDE 2 MEQ/ML IV SOLN
80.0000 meq | INTRAVENOUS | Status: DC
  Filled 2020-11-23: qty 40

## 2020-11-23 MED ORDER — INSULIN REGULAR(HUMAN) IN NACL 100-0.9 UT/100ML-% IV SOLN
INTRAVENOUS | Status: AC
  Administered 2020-11-26: 3.2 [IU]/h via INTRAVENOUS
  Filled 2020-11-23: qty 100

## 2020-11-23 MED ORDER — TRANEXAMIC ACID (OHS) BOLUS VIA INFUSION
15.0000 mg/kg | INTRAVENOUS | Status: AC
  Administered 2020-11-26: 1464 mg via INTRAVENOUS
  Filled 2020-11-23: qty 1464

## 2020-11-23 MED ORDER — DEXMEDETOMIDINE HCL IN NACL 400 MCG/100ML IV SOLN
0.1000 ug/kg/h | INTRAVENOUS | Status: AC
  Administered 2020-11-26: .7 ug/kg/h via INTRAVENOUS
  Filled 2020-11-23: qty 100

## 2020-11-23 MED ORDER — PLASMA-LYTE 148 IV SOLN
INTRAVENOUS | Status: DC
  Filled 2020-11-23: qty 2.5

## 2020-11-23 MED ORDER — SODIUM CHLORIDE 0.9 % IV SOLN
INTRAVENOUS | Status: DC
  Filled 2020-11-23: qty 30

## 2020-11-23 MED ORDER — NOREPINEPHRINE 4 MG/250ML-% IV SOLN
0.0000 ug/min | INTRAVENOUS | Status: DC
  Filled 2020-11-23: qty 250

## 2020-11-23 MED ORDER — MILRINONE LACTATE IN DEXTROSE 20-5 MG/100ML-% IV SOLN
0.3000 ug/kg/min | INTRAVENOUS | Status: DC
  Filled 2020-11-23: qty 100

## 2020-11-23 MED ORDER — TRANEXAMIC ACID (OHS) PUMP PRIME SOLUTION
2.0000 mg/kg | INTRAVENOUS | Status: DC
  Filled 2020-11-23: qty 2.14

## 2020-11-23 MED ORDER — EPINEPHRINE HCL 5 MG/250ML IV SOLN IN NS
0.0000 ug/min | INTRAVENOUS | Status: DC
  Filled 2020-11-23: qty 250

## 2020-11-23 MED ORDER — TRANEXAMIC ACID 1000 MG/10ML IV SOLN
1.5000 mg/kg/h | INTRAVENOUS | Status: AC
  Administered 2020-11-26: 1.5 mg/kg/h via INTRAVENOUS
  Filled 2020-11-23: qty 25

## 2020-11-23 MED ORDER — NITROGLYCERIN IN D5W 200-5 MCG/ML-% IV SOLN
2.0000 ug/min | INTRAVENOUS | Status: AC
  Administered 2020-11-26: 10 ug/min via INTRAVENOUS
  Filled 2020-11-23: qty 250

## 2020-11-23 MED ORDER — PHENYLEPHRINE HCL-NACL 20-0.9 MG/250ML-% IV SOLN
30.0000 ug/min | INTRAVENOUS | Status: DC
  Filled 2020-11-23: qty 250

## 2020-11-23 MED ORDER — MAGNESIUM SULFATE 50 % IJ SOLN
40.0000 meq | INTRAMUSCULAR | Status: DC
  Filled 2020-11-23: qty 9.85

## 2020-11-23 MED ORDER — LEVOFLOXACIN IN D5W 500 MG/100ML IV SOLN
500.0000 mg | INTRAVENOUS | Status: AC
  Administered 2020-11-26: 500 mg via INTRAVENOUS
  Filled 2020-11-23: qty 100

## 2020-11-23 MED ORDER — VANCOMYCIN HCL 1500 MG/300ML IV SOLN
1500.0000 mg | INTRAVENOUS | Status: AC
  Administered 2020-11-26: 1500 mg via INTRAVENOUS
  Filled 2020-11-23: qty 300

## 2020-11-23 NOTE — Progress Notes (Signed)
Pre-CABG carotid duplex study completed.   Please see CV Proc for preliminary results.   Darlin Coco, RDMS, RVT

## 2020-11-23 NOTE — Progress Notes (Signed)
ANTICOAGULATION CONSULT NOTE - Follow Up Consult  Pharmacy Consult for IV Heparin Indication: chest pain/ACS  Heparin Dosing Weight: 112 kg  Labs: Recent Labs    11/21/20 1140 11/21/20 1500 11/21/20 1952 11/22/20 0140 11/22/20 1155 11/23/20 0302 11/23/20 0854 11/23/20 1946  HGB 12.1*  --   --  11.8*  --  11.1*  --   --   HCT 36.9*  --   --  35.6*  --  33.7*  --   --   PLT 258  --   --  194  --  207  --   --   HEPARINUNFRC  --   --    < > 0.37 0.54  --  0.20* 0.59  CREATININE 1.27*  --   --  1.21  --   --  1.14  --   TROPONINIHS 5 4  --   --   --   --   --   --    < > = values in this interval not displayed.    Estimated Creatinine Clearance: 69 mL/min (by C-G formula based on SCr of 1.14 mg/dL).  Assessment: 80 yr old man presented with CP. Pharmacy was consulted to dose heparin (on no anticoagulants PTA).  Pt is S/P cardiac cath 4/14, which showed severe multi-vessel disease; CVTS will be consult for cardiac surgery.  Heparin level this am is low at 0.2 on 1700 units/hr. CBC stable overnight, no bleeding or IV issues noted  PM update: HL 0.59, therapeutic.   Goal of Therapy:  Heparin level 0.3-0.7 units/ml Monitor platelets by anticoagulation protocol: Yes   Plan:  Continue heparin infusion at 1900 units/hr  Check heparin level in AM Monitor daily heparin level, CBC Monitor for bleeding CABG tentative for Monday  Boy Delamater A. Levada Dy, PharmD, BCPS, FNKF Clinical Pharmacist [place] Please utilize Amion for appropriate phone number to reach the unit pharmacist ([place])   11/23/2020 8:54 PM

## 2020-11-23 NOTE — Progress Notes (Signed)
ANTICOAGULATION CONSULT NOTE - Follow Up Consult  Pharmacy Consult for IV Heparin Indication: chest pain/ACS  Heparin Dosing Weight: 112 kg  Labs: Recent Labs    11/21/20 1140 11/21/20 1500 11/21/20 1952 11/22/20 0140 11/22/20 1155 11/23/20 0302 11/23/20 0854  HGB 12.1*  --   --  11.8*  --  11.1*  --   HCT 36.9*  --   --  35.6*  --  33.7*  --   PLT 258  --   --  194  --  207  --   HEPARINUNFRC  --   --    < > 0.37 0.54  --  0.20*  CREATININE 1.27*  --   --  1.21  --   --  1.14  TROPONINIHS 5 4  --   --   --   --   --    < > = values in this interval not displayed.    Estimated Creatinine Clearance: 69 mL/min (by C-G formula based on SCr of 1.14 mg/dL).  Assessment: 80 yr old man presented with CP. Pharmacy was consulted to dose heparin (on no anticoagulants PTA).  Pt is S/P cardiac cath 4/14, which showed severe multi-vessel disease; CVTS will be consult for cardiac surgery.  Heparin level this am is low at 0.2 on 1700 units/hr. CBC stable overnight, no bleeding or IV issues noted.   Goal of Therapy:  Heparin level 0.3-0.7 units/ml Monitor platelets by anticoagulation protocol: Yes   Plan:  Increase heparin infusion to 1900 units/hr  Check heparin level 8 hrs  Monitor daily heparin level, CBC Monitor for bleeding CABG tentative for Monday  Erin Hearing PharmD., BCPS Clinical Pharmacist 11/23/2020 11:50 AM

## 2020-11-23 NOTE — Consult Note (Signed)
TCTS Preliminary Consult Note  Consult received for CABG, work-up underway. Will tentatively plan for CABG Monday. Ormond Lazo Z. Orvan Seen, [place]

## 2020-11-24 DIAGNOSIS — I200 Unstable angina: Secondary | ICD-10-CM | POA: Diagnosis not present

## 2020-11-24 DIAGNOSIS — I25110 Atherosclerotic heart disease of native coronary artery with unstable angina pectoris: Secondary | ICD-10-CM

## 2020-11-24 LAB — GLUCOSE, CAPILLARY
Glucose-Capillary: 125 mg/dL — ABNORMAL HIGH (ref 70–99)
Glucose-Capillary: 105 mg/dL — ABNORMAL HIGH (ref 70–99)
Glucose-Capillary: 163 mg/dL — ABNORMAL HIGH (ref 70–99)
Glucose-Capillary: 113 mg/dL — ABNORMAL HIGH (ref 70–99)

## 2020-11-24 LAB — CBC
HCT: 34.9 % — ABNORMAL LOW (ref 39.0–52.0)
Hemoglobin: 11.5 g/dL — ABNORMAL LOW (ref 13.0–17.0)
MCH: 28.5 pg (ref 26.0–34.0)
MCHC: 33 g/dL (ref 30.0–36.0)
MCV: 86.6 fL (ref 80.0–100.0)
Platelets: 193 10*3/uL (ref 150–400)
RBC: 4.03 MIL/uL — ABNORMAL LOW (ref 4.22–5.81)
RDW: 13.2 % (ref 11.5–15.5)
WBC: 5.6 10*3/uL (ref 4.0–10.5)
nRBC: 0 % (ref 0.0–0.2)

## 2020-11-24 LAB — BASIC METABOLIC PANEL
Anion gap: 8 (ref 5–15)
BUN: 12 mg/dL (ref 8–23)
CO2: 23 mmol/L (ref 22–32)
Calcium: 9.3 mg/dL (ref 8.9–10.3)
Chloride: 108 mmol/L (ref 98–111)
Creatinine, Ser: 1.12 mg/dL (ref 0.61–1.24)
GFR, Estimated: >60 mL/min (ref >60)
Glucose, Bld: 117 mg/dL — ABNORMAL HIGH (ref 70–99)
Potassium: 4.1 mmol/L (ref 3.5–5.1)
Sodium: 139 mmol/L (ref 135–145)

## 2020-11-24 LAB — HEPARIN LEVEL (UNFRACTIONATED): Heparin Unfractionated: 0.58 IU/mL (ref 0.30–0.70)

## 2020-11-24 NOTE — Progress Notes (Signed)
ANTICOAGULATION CONSULT NOTE - Follow Up Consult  Pharmacy Consult for IV Heparin Indication: chest pain/ACS  Heparin Dosing Weight: 112 kg  Labs: Recent Labs    11/21/20 1140 11/21/20 1500 11/21/20 1952 11/22/20 0140 11/22/20 1155 11/23/20 0302 11/23/20 0854 11/23/20 1946 11/24/20 0132  HGB 12.1*  --   --  11.8*  --  11.1*  --   --  11.5*  HCT 36.9*  --   --  35.6*  --  33.7*  --   --  34.9*  PLT 258  --   --  194  --  207  --   --  193  HEPARINUNFRC  --   --    < > 0.37   < >  --  0.20* 0.59 0.58  CREATININE 1.27*  --   --  1.21  --   --  1.14  --  1.12  TROPONINIHS 5 4  --   --   --   --   --   --   --    < > = values in this interval not displayed.    Estimated Creatinine Clearance: 70.3 mL/min (by C-G formula based on SCr of 1.12 mg/dL).  Assessment: 80 yr old man presented with CP. Pharmacy was consulted to dose heparin (on no anticoagulants PTA). Pt is S/P cardiac cath 4/14, which showed severe multi-vessel disease; CVTS planning for CABG 4/18.    Heparin level continues to be therapeutic at 0.58 on heparin 1900 units/hr. CBC stable, no bleeding or IV issues noted.  Goal of Therapy:  Heparin level 0.3-0.7 units/ml Monitor platelets by anticoagulation protocol: Yes   Plan:  Continue heparin infusion at 1900 units/hr  Monitor daily heparin level, CBC CABG tentative for Monday  Romilda Garret, PharmD PGY1 Acute Care Pharmacy Resident 11/24/2020 7:51 AM  Please check [email or web address] for unit specific pharmacy phone numbers.

## 2020-11-24 NOTE — Progress Notes (Signed)
Progress Note  Patient Name: Craig Taylor Date of Encounter: 11/24/2020  [facility] Cardiologist: Carlyle Dolly, MD   Subjective   No complaints  Inpatient Medications    Scheduled Meds: . aspirin EC  81 mg Oral Daily  . dorzolamide-timolol  1 drop Right Eye BID  . [START ON 11/26/2020] epinephrine  0-10 mcg/min Intravenous To OR  . [START ON 11/26/2020] heparin-papaverine-plasmalyte irrigation   Irrigation To OR  . insulin aspart  0-5 Units Subcutaneous QHS  . insulin aspart  0-9 Units Subcutaneous TID WC  . [START ON 11/26/2020] insulin   Intravenous To OR  . [START ON 11/26/2020] magnesium sulfate  40 mEq Other To OR  . metoprolol tartrate  50 mg Oral BID  . [START ON 11/26/2020] phenylephrine  30-200 mcg/min Intravenous To OR  . [START ON 11/26/2020] potassium chloride  80 mEq Other To OR  . simvastatin  40 mg Oral QPM  . sodium chloride flush  3 mL Intravenous Q12H  . sodium chloride flush  3 mL Intravenous Q12H  . tamsulosin  0.4 mg Oral QHS  . [START ON 11/26/2020] tranexamic acid  15 mg/kg (Adjusted) Intravenous To OR  . [START ON 11/26/2020] tranexamic acid  2 mg/kg Intracatheter To OR   Continuous Infusions: . sodium chloride    . [START ON 11/26/2020] dexmedetomidine    . [START ON 11/26/2020] heparin 30,000 units/NS 1000 mL solution for CELLSAVER    . heparin 1,900 Units/hr (11/24/20 0100)  . [START ON 11/26/2020] levofloxacin (LEVAQUIN) IV    . [START ON 11/26/2020] milrinone    . [START ON 11/26/2020] nitroGLYCERIN    . [START ON 11/26/2020] norepinephrine    . [START ON 11/26/2020] tranexamic acid (CYKLOKAPRON) infusion ([facility])    . [START ON 11/26/2020] vancomycin     PRN Meds: sodium chloride, acetaminophen, nitroGLYCERIN, ondansetron (ZOFRAN) IV, sodium chloride flush   Vital Signs    Vitals:   11/23/20 2017 11/23/20 2317 11/24/20 0320 11/24/20 0835  BP: 136/67 (!) 130/56 130/69 (!) 152/63  Pulse: 60 (!) 53 60 73  Resp: 14 13 17 13   Temp: 97.9 F  (36.6 C) 97.9 F (36.6 C) 97.6 F (36.4 C) 98.1 F (36.7 C)  TempSrc: Oral Oral Oral Oral  SpO2: 97% 96% 94% (!) 86%  Weight:   106.6 kg   Height:        Intake/Output Summary (Last 24 hours) at 11/24/2020 0949 Last data filed at 11/24/2020 0400 Gross per 24 hour  Intake 832.71 ml  Output --  Net 832.71 ml   Last 3 Weights 11/24/2020 11/23/2020 11/22/2020  Weight (lbs) 235 lb 235 lb 12.8 oz 236 lb  Weight (kg) 106.595 kg 106.958 kg 107.049 kg      Telemetry    NSR- Personally Reviewed  ECG    n/a - Personally Reviewed  Physical Exam   GEN: No acute distress.   Neck: No JVD Cardiac: RRR, no murmurs, rubs, or gallops.  Respiratory: Clear to auscultation bilaterally. GI: Soft, nontender, non-distended  MS: No edema; No deformity. Neuro:  Nonfocal  Psych: Normal affect   Labs    High Sensitivity Troponin:   Recent Labs  Lab 11/21/20 1140 11/21/20 1500  TROPONINIHS 5 4      Chemistry Recent Labs  Lab 11/22/20 0140 11/23/20 0854 11/24/20 0132  NA 139 138 139  K 3.9 4.1 4.1  CL 108 109 108  CO2 24 24 23   GLUCOSE 143* 141* 117*  BUN 20 13  12  CREATININE 1.21 1.14 1.12  CALCIUM 9.1 8.9 9.3  GFRNONAA >60 >60 >60  ANIONGAP 7 5 8      Hematology Recent Labs  Lab 11/22/20 0140 11/23/20 0302 11/24/20 0132  WBC 6.0 5.7 5.6  RBC 4.09* 3.88* 4.03*  HGB 11.8* 11.1* 11.5*  HCT 35.6* 33.7* 34.9*  MCV 87.0 86.9 86.6  MCH 28.9 28.6 28.5  MCHC 33.1 32.9 33.0  RDW 13.2 13.5 13.2  PLT 194 207 193    BNPNo results for input(s): BNP, PROBNP in the last 168 hours.   DDimer No results for input(s): DDIMER in the last 168 hours.   Radiology    CT CHEST WO CONTRAST  Result Date: 11/23/2020 CLINICAL DATA:  Thoracic aortic disease. Preop for coronary artery bypass graft. EXAM: CT CHEST WITHOUT CONTRAST TECHNIQUE: Multidetector CT imaging of the chest was performed following the standard protocol without IV contrast. COMPARISON:  None. FINDINGS: Cardiovascular:  Atherosclerosis of thoracic aorta is noted without aneurysm formation. Normal cardiac size. No pericardial effusion. 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 Coronary artery calcifications are noted. Mediastinum/Nodes: No enlarged mediastinal or axillary lymph nodes. Thyroid gland, trachea, and esophagus demonstrate no significant findings. Lungs/Pleura: No pneumothorax or pleural effusion is noted. Left lung is clear. 6 mm nodule is noted in right middle lobe adjacent to minor fissure best seen on image number 73 of series 4. Upper Abdomen: No acute abnormality. Musculoskeletal: No chest wall mass or suspicious bone lesions identified. IMPRESSION: 6 mm nodule seen in right middle lobe. Non-contrast chest CT at 6-12 months is recommended. If the nodule is stable at time of repeat CT, then future CT at 18-24 months (from today's scan) is considered optional for low-risk patients, but is recommended for high-risk patients. This recommendation follows the consensus statement: Guidelines for Management of Incidental Pulmonary Nodules Detected on CT Images: From the Fleischner Society 2017; Radiology 2017; 284:228-243. Coronary artery calcifications are noted suggesting coronary artery disease. Aortic Atherosclerosis (ICD10-I70.0). Electronically Signed   By: Marijo Conception M.D.   On: 11/23/2020 13:20   CARDIAC CATHETERIZATION  Result Date: 8/93/8101  LV end diastolic pressure is normal.  There is no aortic valve stenosis.  Prox RCA to Mid RCA lesion is 10% stenosed.  Mid RCA lesion is 70% stenosed.  Dist RCA lesion is 60% stenosed.  Mid LM to Ost LAD lesion is 60% stenosed with 99% stenosed side Lunden Stieber in Ost Cx.  Ost LAD to Mid LAD lesion is 45% stenosed.  SUMMARY  Severe multivessel CAD:  Distal left main 55-60% involving 99% ostial LCx and ostial high OM1/RI; LCx otherwise has mild proximal and mid disease terminating as major branching lateral a OM Amanpreet Delmont  40% proximal  LAD with relatively minimal disease throughout the remainder the LAD and major 1st Diag  Tandem mid and distal RCA lesions of 70% and 60%  Normal EDP.  Normal EF by echo RECOMMENDATIONS  Return to nursing unit for ongoing post-cath care.   Restart IV heparin 8 hours post sheath removal.  Patient has surgical disease with the distal left main and ostial LCx.  Will consult CVTS in the morning.  PCI options would be high risk given the sharp angle takeoff of the circumflex, calcified ostial lesion with a sidebranch (high OM/RI).  Would need to stent across the LCx into the LAD as well (DK crush versus DK culotte technique would be required)  Continue aggressive risk modification. Glenetta Hew, MD   VAS US  DOPPLER PRE CABG  Result Date: 11/23/2020 PREOPERATIVE VASCULAR EVALUATION  Indications:      Pre-CABG. Risk Factors:     Hypertension, hyperlipidemia, past history of smoking, prior                   MI, coronary artery disease. Comparison Study: 03-14-2016 Prior carotid artery duplex showed minimal intimal                   thickening of the LT and unremarkable RT carotid. Performing Technologist: Darlin Coco RDMS,RVT  Examination Guidelines: A complete evaluation includes B-mode imaging, spectral Doppler, color Doppler, and power Doppler as needed of all accessible portions of each vessel. Bilateral testing is considered an integral part of a complete examination. Limited examinations for reoccurring indications may be performed as noted.  Right Carotid Findings: +----------+--------+--------+--------+--------+--------+           PSV cm/sEDV cm/sStenosisDescribeComments +----------+--------+--------+--------+--------+--------+ CCA Prox  122     21                               +----------+--------+--------+--------+--------+--------+ CCA Distal76      16                               +----------+--------+--------+--------+--------+--------+ ICA Prox  57      15                                +----------+--------+--------+--------+--------+--------+ ICA Distal101     21                               +----------+--------+--------+--------+--------+--------+ ECA       103                                      +----------+--------+--------+--------+--------+--------+ Portions of this table do not appear on this page. +----------+--------+-------+----------------+------------+           PSV cm/sEDV cmsDescribe        Arm Pressure +----------+--------+-------+----------------+------------+ Subclavian114            Multiphasic, WNL             +----------+--------+-------+----------------+------------+ +---------+--------+--+--------+--+---------+ VertebralPSV cm/s59EDV cm/s15Antegrade +---------+--------+--+--------+--+---------+ Left Carotid Findings: +----------+--------+--------+--------+---------------------------+--------+           PSV cm/sEDV cm/sStenosisDescribe                   Comments +----------+--------+--------+--------+---------------------------+--------+ CCA Prox  90      12                                                  +----------+--------+--------+--------+---------------------------+--------+ CCA Distal68      19                                                  +----------+--------+--------+--------+---------------------------+--------+ ICA Prox  68      17  mild, focal and hyperechoic         +----------+--------+--------+--------+---------------------------+--------+ ICA Distal65      22                                                  +----------+--------+--------+--------+---------------------------+--------+ ECA       93                                                          +----------+--------+--------+--------+---------------------------+--------+ +----------+--------+--------+----------------+------------+ SubclavianPSV cm/sEDV cm/sDescribe        Arm Pressure  +----------+--------+--------+----------------+------------+           168             Multiphasic, WNL             +----------+--------+--------+----------------+------------+ +---------+--------+--+--------+--+---------+ VertebralPSV cm/s66EDV cm/s21Antegrade +---------+--------+--+--------+--+---------+  Summary: Right Carotid: The extracranial vessels were near-normal with only minimal wall                thickening or plaque. Left Carotid: The extracranial vessels were near-normal with only minimal wall               thickening or plaque.  Electronically signed by Servando Snare MD on 11/23/2020 at 2:26:50 PM.    Final     Cardiac Studies    Patient Profile        80 y.o. male with a PMH of CAD s/p stent to RCA in 2003, HTN, HLD, DM type 2, bladder cancer s/p TURBT 2020, CKD stage 3a, who presented with chest pain.  Assessment & Plan    1. CAD/Unstable angina - history of RCA stent in 2003 - trops neg x 2, no acute ishcemic EKG changes - 11/2020 echo LVE 55-60%, no WMAs, normal RV function - given typical unstable angina symptoms referred for cath  - 11/2020 cath: mid LM to ostial LAD 60% and 99% side Shontay Wallner in ostial LCX, mid LAD 45%, RCA mid 70% and distal 60% - CT surgery consulted, tenative plans for CABG Monday  - medical therapy with ASA 81, hep gtt, lopressor 50mg  bid, simva 40 - consider ACE/ARB after CABG .   2. Hyperlipidemia - has been on long standing simva 40mg  daily, LDL 54 on admission. Continue current therapy.  - listed as having itching on atorvastatin in the past  For questions or updates, please contact [place] Please consult [email or web address] for contact info under        Signed, Carlyle Dolly, MD  11/24/2020, 9:49 AM

## 2020-11-24 NOTE — Consult Note (Signed)
[address]       [place], [address]             [phone]        Keevin A Hoobler [place] Medical Record #[id number] Date of Birth: 23-Aug-1940  Referring: No ref. provider found Primary Care: Redmond School, MD Primary Cardiologist:Branch, Roderic Palau, MD  Chief Complaint:    Chief Complaint  Patient presents with  . Chest Pain    History of Present Illness:      80 with man with h/o CAD s/p PCI 20 years ago and PMHx otherwise notable for bladder CA, HTN, HLD, DM presented with exertional angina worsening over past 2 weeks. Had mild SOB/DOE as well. Pain described in upper chest without radiation. Sought evaluation in ED where Troponins negative, but hx concerning for [place] and underwent LHC, which shows severe multivessel CAD. Referral received for CABG. Has been intermittently bradycardiac in 50s, asx. Admitted awaiting CABG; has been CP free as long as he remains at rest. Current Activity/ Functional Status: Patient will be independent with mobility/ambulation, transfers, ADL's, IADL's.   Zubrod Score: At the time of surgery this patient's most appropriate activity status/level should be described as: []     0    Normal activity, no symptoms [x]     1    Restricted in physical strenuous activity but ambulatory, able to do out light work []     2    Ambulatory and capable of self care, unable to do work activities, up and about                 more than 50%  Of the time                            []     3    Only limited self care, in bed greater than 50% of waking hours []     4    Completely disabled, no self care, confined to bed or chair []     5    Moribund  Past Medical History:  Diagnosis Date  . Benign localized prostatic hyperplasia with lower urinary tract symptoms (LUTS)   . Bladder cancer [facility])    urologist--- dr winter---  s/p  TURBT 09/ 2020  . CKD (chronic kidney disease), stage III ([place])    neprologist--- dr Lowanda Foster  (12-15-2019  per pt  was released to pcp at [facility] 02-06-2018 scanned in epic under media  . Coronary artery disease cardiologist--- dr Bronson Ing   hx MI in 2003--- 08-02-2002  s/p cardiac cath with PCI and DES to [facility] and PDA;  cath 10-04-2002 patent stents w/ 30-40% AV groove Cx with ef 50% and mild inferior hypokinesis  . COVID-19 12/16/2019   asymptomatic  . Full dentures   . Glaucoma, right eye   . History of basal cell carcinoma (BCC) excision   . History of gout    per pt one episode 01/ 2021   . History of kidney stones   . History of MI (myocardial infarction) 08/01/2002  . History of ulcer of large intestine    per pt approx. 2002 told due to oral voltaren , colonoscopy/ egd with no intervention,  but did have blood transfusions  . Hyperlipidemia   . Hypertension    followed by pcp   (nuclear stress test in epic 05-11-2018  low risk w/ no ischemia, nuclear ef 55%)  . IDA (  iron deficiency anemia)   . Nephrolithiasis   . Non-insulin dependent type 2 diabetes mellitus ([place])    followed by pcp  (12-15-2019   per pt checks blood sugar 3 times a week,  fasting sugar-- 120-140)  . S/P drug eluting coronary stent placement 08/02/2002   to proximal RCA and PDA  . Wears glasses     Past Surgical History:  Procedure Laterality Date  . BIOPSY  07/13/2018   Procedure: BIOPSY;  Surgeon: Daneil Dolin, MD;  [facility];  Service: Endoscopy;;  (gastric)  . CARDIAC CATHETERIZATION  08/02/2002   dr berry   Crux of RCA 95% stenosis, stented with a 2.5x52mm CYPHER stent resulting in reduction of 95% stenosis to 0% residual; overlapping CYPHER stents -3x27mm and 3x45mm- deployed at 15atm resulting in reduction of 70% stenosis to 0% residual.  . CARDIAC CATHETERIZATION  10/04/2002    dr berry   patent stents w/ 30-40% AV groove LCx , mild inferior hypokinesis, ef 50%  . COLONOSCOPY N/A 05/31/2014   Dr. Gala Romney: Normal  . COLONOSCOPY N/A 07/13/2018   Procedure: COLONOSCOPY;  Surgeon: Daneil Dolin, MD;   [facility];  Service: Endoscopy;  Laterality: N/A;  8:30am  . ESOPHAGOGASTRODUODENOSCOPY N/A 07/13/2018   Procedure: ESOPHAGOGASTRODUODENOSCOPY (EGD);  Surgeon: Daneil Dolin, MD;  [facility];  Service: Endoscopy;  Laterality: N/A;  . LEFT HEART CATH AND CORONARY ANGIOGRAPHY N/A 11/22/2020   Procedure: LEFT HEART CATH AND CORONARY ANGIOGRAPHY;  Surgeon: Leonie Man, MD;  Location: [facility];  Service: Cardiovascular;  Laterality: N/A;  . TRANSURETHRAL RESECTION OF BLADDER TUMOR N/A 04/22/2019   Procedure: TRANSURETHRAL RESECTION OF BLADDER TUMOR (TURBT)/ CYSTOSCOPY BILATERAL RETROGRADE PYELOGRAMS,  GEMCITABINE INSTILLATION;  Surgeon: Ceasar Mons, MD;  Location: [facility];  Service: Urology;  Laterality: N/A;  ONLY NEEDS 45 MIN  . TRANSURETHRAL RESECTION OF BLADDER TUMOR N/A 01/25/2020   Procedure: TRANSURETHRAL RESECTION OF BLADDER TUMOR (TURBT)/ CYSTOSCOPY;  Surgeon: Ceasar Mons, MD;  Location: [facility];  Service: Urology;  Laterality: N/A;  ONLY NEEDS 30 MIN    Social History   Tobacco Use  Smoking Status Former Smoker  . Packs/day: 2.00  . Years: 21.00  . Pack years: 42.00  . Types: Cigarettes  . Start date: 07/04/1957  . Quit date: 08/09/1983  . Years since quitting: 37.3  Smokeless Tobacco Former Systems developer  . Types: Chew  . Quit date: 08/11/2012    Social History   Substance and Sexual Activity  Alcohol Use No  . Alcohol/week: 0.0 standard drinks     Allergies  Allergen Reactions  . Lipitor [Atorvastatin]     pruritis  . Voltaren [Diclofenac Sodium] Other (See Comments)    "caused intestine ulcers with bleeding"  . Penicillins Rash    Has patient had a PCN reaction causing immediate rash, facial/tongue/throat swelling, SOB or lightheadedness with hypotension: No Has patient had a PCN reaction causing severe rash involving mucus membranes or skin necrosis: No Has patient had a  PCN reaction that required hospitalization: No Has patient had a PCN reaction occurring within the last 10 years: No If all of the above answers are "NO", then may proceed with Cephalosporin use.     Current Facility-Administered Medications  Medication Dose Route Frequency Provider Last Rate Last Admin  . 0.9 %  sodium chloride infusion  250 mL Intravenous PRN Leonie Man, MD      . acetaminophen (TYLENOL)  tablet 650 mg  650 mg Oral Q4H PRN Leonie Man, MD   650 mg at 11/22/20 2009  . aspirin EC tablet 81 mg  81 mg Oral Daily Leonie Man, MD   81 mg at 11/24/20 0946  . [START ON 11/26/2020] dexmedetomidine (PRECEDEX) 400 MCG/100ML (4 mcg/mL) infusion  0.1-0.7 mcg/kg/hr Intravenous To OR Erika Slaby Z, MD      . dorzolamide-timolol (COSOPT) 22.3-6.8 MG/ML ophthalmic solution 1 drop  1 drop Right Eye BID Leonie Man, MD   1 drop at 11/24/20 0945  . [START ON 11/26/2020] EPINEPHrine (ADRENALIN) 4 mg in NS 250 mL (0.016 mg/mL) premix infusion  0-10 mcg/min Intravenous To OR Chanee Henrickson, Glenice Bow, MD      . Derrill Memo ON 11/26/2020] heparin 30,000 units/NS 1000 mL solution for CELLSAVER   Other To OR Jerzi Tigert Z, MD      . heparin ADULT infusion 100 units/mL (25000 units/222mL)  1,900 Units/hr Intravenous Continuous Lyndee Leo, RPH 19 mL/hr at 11/24/20 0100 1,900 Units/hr at 11/24/20 0100  . [START ON 11/26/2020] heparin sodium (porcine) 2,500 Units, papaverine 30 mg in electrolyte-148 (PLASMALYTE-148) 500 mL irrigation   Irrigation To OR Ronelle Smallman Z, MD      . insulin aspart (novoLOG) injection 0-5 Units  0-5 Units Subcutaneous QHS Leonie Man, MD      . insulin aspart (novoLOG) injection 0-9 Units  0-9 Units Subcutaneous TID WC Leonie Man, MD   2 Units at 11/23/20 1207  . [START ON 11/26/2020] insulin regular, human (MYXREDLIN) 100 units/ 100 mL infusion   Intravenous To OR Jamie Hafford, Glenice Bow, MD      . Derrill Memo ON 11/26/2020] levofloxacin (LEVAQUIN) IVPB 500 mg   500 mg Intravenous To OR Wonda Olds, MD      . Derrill Memo ON 11/26/2020] magnesium sulfate (IV Push/IM) injection 40 mEq  40 mEq Other To OR Jermanie Minshall, Glenice Bow, MD      . metoprolol tartrate (LOPRESSOR) tablet 50 mg  50 mg Oral BID Leonie Man, MD   50 mg at 11/24/20 0946  . [START ON 11/26/2020] milrinone (PRIMACOR) 20 MG/100 ML (0.2 mg/mL) infusion  0.3 mcg/kg/min Intravenous To OR Anden Bartolo, Glenice Bow, MD      . nitroGLYCERIN (NITROSTAT) SL tablet 0.4 mg  0.4 mg Sublingual Q5 Min x 3 PRN Leonie Man, MD      . Derrill Memo ON 11/26/2020] nitroGLYCERIN 50 mg in dextrose 5 % 250 mL (0.2 mg/mL) infusion  2-200 mcg/min Intravenous To OR Wonda Olds, MD      . Derrill Memo ON 11/26/2020] norepinephrine (LEVOPHED) 4mg  in 222mL premix infusion  0-40 mcg/min Intravenous To OR Lacinda Curvin, Glenice Bow, MD      . ondansetron (ZOFRAN) injection 4 mg  4 mg Intravenous Q6H PRN Leonie Man, MD      . Derrill Memo ON 11/26/2020] phenylephrine (NEOSYNEPHRINE) 20-0.9 MG/250ML-% infusion  30-200 mcg/min Intravenous To OR Wonda Olds, MD      . Derrill Memo ON 11/26/2020] potassium chloride injection 80 mEq  80 mEq Other To OR Hani Campusano, Glenice Bow, MD      . simvastatin (ZOCOR) tablet 40 mg  40 mg Oral QPM Leonie Man, MD   40 mg at 11/23/20 1717  . sodium chloride flush (NS) 0.9 % injection 3 mL  3 mL Intravenous Q12H Leonie Man, MD      . sodium chloride flush (NS) 0.9 % injection 3 mL  3 mL  Intravenous Q12H Leonie Man, MD      . sodium chloride flush (NS) 0.9 % injection 3 mL  3 mL Intravenous PRN Leonie Man, MD      . tamsulosin [facility]) capsule 0.4 mg  0.4 mg Oral QHS Leonie Man, MD   0.4 mg at 11/23/20 2154  . [START ON 11/26/2020] tranexamic acid (CYKLOKAPRON) 2,500 mg in sodium chloride 0.9 % 250 mL (10 mg/mL) infusion  1.5 mg/kg/hr Intravenous To OR Ahlayah Tarkowski, Glenice Bow, MD      . Derrill Memo ON 11/26/2020] tranexamic acid (CYKLOKAPRON) bolus via infusion - over 30 minutes 1,464 mg  15 mg/kg (Adjusted)  Intravenous To OR Wonda Olds, MD      . Derrill Memo ON 11/26/2020] tranexamic acid (CYKLOKAPRON) pump prime solution 214 mg  2 mg/kg Intracatheter To OR Corbyn Steedman, Glenice Bow, MD      . Derrill Memo ON 11/26/2020] vancomycin (VANCOREADY) IVPB 1500 mg/300 mL  1,500 mg Intravenous To OR Misaki Sozio, Glenice Bow, MD        Medications Prior to Admission  Medication Sig Dispense Refill Last Dose  . aspirin EC 81 MG tablet Take 81 mg by mouth daily.   11/21/2020 at Unknown time  . Cholecalciferol (VITAMIN D-3) 125 MCG (5000 UT) TABS Take 1 tablet by mouth at bedtime.    11/21/2020 at Unknown time  . Cinnamon 500 MG TABS Take 1 tablet by mouth 2 (two) times daily.    11/21/2020 at Unknown time  . dorzolamide-timolol (COSOPT) 22.3-6.8 MG/ML ophthalmic solution Place 1 drop into the right eye in the morning and at bedtime.   11/21/2020 at Unknown time  . enalapril (VASOTEC) 2.5 MG tablet Take 2.5 mg by mouth daily.    11/21/2020 at Unknown time  . metFORMIN (GLUCOPHAGE) 500 MG tablet Take 1,000 mg by mouth 2 (two) times daily with a meal.     . metoprolol tartrate (LOPRESSOR) 50 MG tablet Take 1 tablet (50 mg total) by mouth 2 (two) times daily. 180 tablet 1 11/21/2020 at 0800  . nitroGLYCERIN (NITROSTAT) 0.4 MG SL tablet Place 1 tablet (0.4 mg total) under the tongue every 5 (five) minutes x 3 doses as needed for chest pain (if no relief after 2nd dose, proceed to the ED for an evaluation or call 911). 25 tablet 1 unknown at unknown  . Omega-3 Fatty Acids (FISH OIL PO) Take 1 tablet by mouth in the morning and at bedtime.   11/21/2020 at Unknown time  . simvastatin (ZOCOR) 40 MG tablet TAKE 1 TABLET BY MOUTH ONCE DAILY (DOSE  INCREASE  9/9) (Patient taking differently: Take 40 mg by mouth every evening. TAKE 1 TABLET BY MOUTH ONCE DAILY (DOSE  INCREASE  9/9)) 90 tablet 2 11/21/2020 at Unknown time  . tamsulosin (FLOMAX) 0.4 MG CAPS capsule Take 0.4 mg by mouth in the morning and at bedtime.   11/21/2020 at Unknown time  . zinc  gluconate 50 MG tablet Take 50 mg by mouth daily.   11/21/2020 at Unknown time    Family History  Problem Relation Age of Onset  . Stroke Mother   . Diabetes Mother   . Colon cancer Neg Hx      Review of Systems:   ROS Pertinent items noted in HPI and remainder of comprehensive ROS otherwise negative.     Cardiac Review of Systems: Y or  [    ]= no  Chest Pain [    ]  Resting SOB [   ]  Exertional SOB  [  ]  Orthopnea [  ]   Pedal Edema [   ]    Palpitations [  ] Syncope  [  ]   Presyncope [   ]  General Review of Systems: [Y] = yes [  ]=no Constitional: recent weight change [  ]; anorexia [  ]; fatigue [  ]; nausea [  ]; night sweats [  ]; fever [  ]; or chills [  ]                                                               Dental: Last Dentist visit:   Eye : blurred vision [  ]; diplopia [   ]; vision changes [  ];  Amaurosis fugax[  ]; Resp: cough [  ];  wheezing[  ];  hemoptysis[  ]; shortness of breath[  ]; paroxysmal nocturnal dyspnea[  ]; dyspnea on exertion[  ]; or orthopnea[  ];  GI:  gallstones[  ], vomiting[  ];  dysphagia[  ]; melena[  ];  hematochezia [  ]; heartburn[  ];   Hx of  Colonoscopy[  ]; GU: kidney stones [  ]; hematuria[  ];   dysuria [  ];  nocturia[  ];  history of     obstruction [  ]; urinary frequency [  ]             Skin: rash, swelling[  ];, hair loss[  ];  peripheral edema[  ];  or itching[  ]; Musculosketetal: myalgias[  ];  joint swelling[  ];  joint erythema[  ];  joint pain[  ];  back pain[  ];  Heme/Lymph: bruising[  ];  bleeding[  ];  anemia[  ];  Neuro: TIA[  ];  headaches[  ];  stroke[  ];  vertigo[  ];  seizures[  ];   paresthesias[  ];  difficulty walking[  ];  Psych:depression[  ]; anxiety[  ];  Endocrine: diabetes[  ];  thyroid dysfunction[  ];      Physical Exam: BP 122/66 (BP Location: Left Arm)   Pulse (!) 39   Temp 98.1 F (36.7 C) (Oral)   Resp 16   Ht 6\' 6"  (1.981 m)   Wt 106.6 kg   SpO2 92%   BMI 27.16 kg/m    General  appearance: alert and cooperative Head: Normocephalic, without obvious abnormality, atraumatic Neck: no adenopathy, no carotid bruit, no JVD, supple, symmetrical, trachea midline and thyroid not enlarged, symmetric, no tenderness/mass/nodules Resp: clear to auscultation bilaterally Cardio: regular rate and rhythm, S1, S2 normal, no murmur, click, rub or gallop GI: soft, non-tender; bowel sounds normal; no masses,  no organomegaly Extremities: extremities normal, atraumatic, no cyanosis or edema Neurologic: Alert and oriented X 3, normal strength and tone. Normal symmetric reflexes. Normal coordination and gait  Diagnostic Studies & Laboratory data:     Recent Radiology Findings:   CT CHEST WO CONTRAST  Result Date: 11/23/2020 CLINICAL DATA:  Thoracic aortic disease. Preop for coronary artery bypass graft. EXAM: CT CHEST WITHOUT CONTRAST TECHNIQUE: Multidetector CT imaging of the chest was performed following the standard protocol without IV contrast. COMPARISON:  None. FINDINGS: Cardiovascular: Atherosclerosis of thoracic aorta is noted without aneurysm formation. Normal cardiac size.  No pericardial effusion. 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 Coronary artery calcifications are noted. Mediastinum/Nodes: No enlarged mediastinal or axillary lymph nodes. Thyroid gland, trachea, and esophagus demonstrate no significant findings. Lungs/Pleura: No pneumothorax or pleural effusion is noted. Left lung is clear. 6 mm nodule is noted in right middle lobe adjacent to minor fissure best seen on image number 73 of series 4. Upper Abdomen: No acute abnormality. Musculoskeletal: No chest wall mass or suspicious bone lesions identified. IMPRESSION: 6 mm nodule seen in right middle lobe. Non-contrast chest CT at 6-12 months is recommended. If the nodule is stable at time of repeat CT, then future CT at 18-24 months (from today's scan) is considered optional for low-risk patients, but  is recommended for high-risk patients. This recommendation follows the consensus statement: Guidelines for Management of Incidental Pulmonary Nodules Detected on CT Images: From the Fleischner Society 2017; Radiology 2017; 284:228-243. Coronary artery calcifications are noted suggesting coronary artery disease. Aortic Atherosclerosis (ICD10-I70.0). Electronically Signed   By: Marijo Conception M.D.   On: 11/23/2020 13:20   CARDIAC CATHETERIZATION  Result Date: 08/29/1476  LV end diastolic pressure is normal.  There is no aortic valve stenosis.  Prox RCA to Mid RCA lesion is 10% stenosed.  Mid RCA lesion is 70% stenosed.  Dist RCA lesion is 60% stenosed.  Mid LM to Ost LAD lesion is 60% stenosed with 99% stenosed side branch in Ost Cx.  Ost LAD to Mid LAD lesion is 45% stenosed.  SUMMARY  Severe multivessel CAD:  Distal left main 55-60% involving 99% ostial LCx and ostial high OM1/RI; LCx otherwise has mild proximal and mid disease terminating as major branching lateral a OM branch  40% proximal LAD with relatively minimal disease throughout the remainder the LAD and major 1st Diag  Tandem mid and distal RCA lesions of 70% and 60%  Normal EDP.  Normal EF by echo RECOMMENDATIONS  Return to nursing unit for ongoing post-cath care.   Restart IV heparin 8 hours post sheath removal.  Patient has surgical disease with the distal left main and ostial LCx.  Will consult CVTS in the morning.  PCI options would be high risk given the sharp angle takeoff of the circumflex, calcified ostial lesion with a sidebranch (high OM/RI).  Would need to stent across the LCx into the LAD as well (DK crush versus DK culotte technique would be required)  Continue aggressive risk modification. Glenetta Hew, MD   VAS US DOPPLER PRE CABG  Result Date: 11/23/2020 PREOPERATIVE VASCULAR EVALUATION  Indications:      Pre-CABG. Risk Factors:     Hypertension, hyperlipidemia, past history of smoking, prior                    MI, coronary artery disease. Comparison Study: 03-14-2016 Prior carotid artery duplex showed minimal intimal                   thickening of the LT and unremarkable RT carotid. Performing Technologist: Darlin Coco RDMS,RVT  Examination Guidelines: A complete evaluation includes B-mode imaging, spectral Doppler, color Doppler, and power Doppler as needed of all accessible portions of each vessel. Bilateral testing is considered an integral part of a complete examination. Limited examinations for reoccurring indications may be performed as noted.  Right Carotid Findings: +----------+--------+--------+--------+--------+--------+           PSV cm/sEDV cm/sStenosisDescribeComments +----------+--------+--------+--------+--------+--------+ CCA Prox  122     21                               +----------+--------+--------+--------+--------+--------+  CCA Distal76      16                               +----------+--------+--------+--------+--------+--------+ ICA Prox  57      15                               +----------+--------+--------+--------+--------+--------+ ICA Distal101     21                               +----------+--------+--------+--------+--------+--------+ ECA       103                                      +----------+--------+--------+--------+--------+--------+ Portions of this table do not appear on this page. +----------+--------+-------+----------------+------------+           PSV cm/sEDV cmsDescribe        Arm Pressure +----------+--------+-------+----------------+------------+ Subclavian114            Multiphasic, WNL             +----------+--------+-------+----------------+------------+ +---------+--------+--+--------+--+---------+ VertebralPSV cm/s59EDV cm/s15Antegrade +---------+--------+--+--------+--+---------+ Left Carotid Findings: +----------+--------+--------+--------+---------------------------+--------+           PSV cm/sEDV  cm/sStenosisDescribe                   Comments +----------+--------+--------+--------+---------------------------+--------+ CCA Prox  90      12                                                  +----------+--------+--------+--------+---------------------------+--------+ CCA Distal68      19                                                  +----------+--------+--------+--------+---------------------------+--------+ ICA Prox  68      17              mild, focal and hyperechoic         +----------+--------+--------+--------+---------------------------+--------+ ICA Distal65      22                                                  +----------+--------+--------+--------+---------------------------+--------+ ECA       93                                                          +----------+--------+--------+--------+---------------------------+--------+ +----------+--------+--------+----------------+------------+ SubclavianPSV cm/sEDV cm/sDescribe        Arm Pressure +----------+--------+--------+----------------+------------+           168             Multiphasic, WNL             +----------+--------+--------+----------------+------------+ +---------+--------+--+--------+--+---------+ VertebralPSV  cm/s66EDV cm/s21Antegrade +---------+--------+--+--------+--+---------+  Summary: Right Carotid: The extracranial vessels were near-normal with only minimal wall                thickening or plaque. Left Carotid: The extracranial vessels were near-normal with only minimal wall               thickening or plaque.  Electronically signed by Servando Snare MD on 11/23/2020 at 2:26:50 PM.    Final      I have independently reviewed the above radiologic studies and discussed with the patient   Recent Lab Findings: Lab Results  Component Value Date   WBC 5.6 11/24/2020   HGB 11.5 (L) 11/24/2020   HCT 34.9 (L) 11/24/2020   PLT 193 11/24/2020   GLUCOSE 117 (H) 11/24/2020    CHOL 105 11/22/2020   TRIG 131 11/22/2020   HDL 25 (L) 11/22/2020   LDLCALC 54 11/22/2020   ALT 17 06/18/2018   AST 22 06/18/2018   NA 139 11/24/2020   K 4.1 11/24/2020   CL 108 11/24/2020   CREATININE 1.12 11/24/2020   BUN 12 11/24/2020   CO2 23 11/24/2020   HGBA1C 6.3 (H) 11/22/2020      Assessment / Plan:      80 yo man with h/o CAD and progressive, exertional angina. Cath evidence for severe MV CAD. Agree with plan for CABG as best medical treatment option. Plan CABG on Monday. Preop orders to be written tomorrow.    I  spent 30 minutes counseling the patient face to face.   Ivar Domangue Z. Orvan Seen, MD [phone] 11/24/2020 1:16 PM

## 2020-11-25 DIAGNOSIS — I200 Unstable angina: Secondary | ICD-10-CM | POA: Diagnosis not present

## 2020-11-25 LAB — CBC
HCT: 34 % — ABNORMAL LOW (ref 39.0–52.0)
Hemoglobin: 11.3 g/dL — ABNORMAL LOW (ref 13.0–17.0)
MCH: 28.9 pg (ref 26.0–34.0)
MCHC: 33.2 g/dL (ref 30.0–36.0)
MCV: 87 fL (ref 80.0–100.0)
Platelets: 184 10*3/uL (ref 150–400)
RBC: 3.91 MIL/uL — ABNORMAL LOW (ref 4.22–5.81)
RDW: 13.3 % (ref 11.5–15.5)
WBC: 5.9 10*3/uL (ref 4.0–10.5)
nRBC: 0 % (ref 0.0–0.2)

## 2020-11-25 LAB — PROTIME-INR
INR: 1 (ref 0.8–1.2)
Prothrombin Time: 13.1 seconds (ref 11.4–15.2)

## 2020-11-25 LAB — BLOOD GAS, ARTERIAL
Acid-base deficit: 3.2 mmol/L — ABNORMAL HIGH (ref 0.0–2.0)
Bicarbonate: 20.8 mmol/L (ref 20.0–28.0)
Drawn by: 164
FIO2: 21
O2 Saturation: 95.6 %
Patient temperature: 36.6
pCO2 arterial: 33.9 mmHg (ref 32.0–48.0)
pH, Arterial: 7.403 (ref 7.350–7.450)
pO2, Arterial: 81.1 mmHg — ABNORMAL LOW (ref 83.0–108.0)

## 2020-11-25 LAB — URINALYSIS, ROUTINE W REFLEX MICROSCOPIC
Bilirubin Urine: NEGATIVE
Glucose, UA: NEGATIVE mg/dL
Hgb urine dipstick: NEGATIVE
Ketones, ur: NEGATIVE mg/dL
Leukocytes,Ua: NEGATIVE
Nitrite: NEGATIVE
Protein, ur: NEGATIVE mg/dL
Specific Gravity, Urine: 1.011 (ref 1.005–1.030)
pH: 5 (ref 5.0–8.0)

## 2020-11-25 LAB — TYPE AND SCREEN
ABO/RH(D): A POS
Antibody Screen: NEGATIVE

## 2020-11-25 LAB — BASIC METABOLIC PANEL
Anion gap: 6 (ref 5–15)
BUN: 16 mg/dL (ref 8–23)
CO2: 22 mmol/L (ref 22–32)
Calcium: 9 mg/dL (ref 8.9–10.3)
Chloride: 110 mmol/L (ref 98–111)
Creatinine, Ser: 1.14 mg/dL (ref 0.61–1.24)
GFR, Estimated: >60 mL/min (ref >60)
Glucose, Bld: 132 mg/dL — ABNORMAL HIGH (ref 70–99)
Potassium: 4 mmol/L (ref 3.5–5.1)
Sodium: 138 mmol/L (ref 135–145)

## 2020-11-25 LAB — APTT: aPTT: 100 seconds — ABNORMAL HIGH (ref 24–36)

## 2020-11-25 LAB — GLUCOSE, CAPILLARY
Glucose-Capillary: 133 mg/dL — ABNORMAL HIGH (ref 70–99)
Glucose-Capillary: 165 mg/dL — ABNORMAL HIGH (ref 70–99)
Glucose-Capillary: 95 mg/dL (ref 70–99)
Glucose-Capillary: 128 mg/dL — ABNORMAL HIGH (ref 70–99)

## 2020-11-25 LAB — HEPARIN LEVEL (UNFRACTIONATED): Heparin Unfractionated: 0.47 IU/mL (ref 0.30–0.70)

## 2020-11-25 LAB — ABO/RH: ABO/RH(D): A POS

## 2020-11-25 MED ORDER — BISACODYL 5 MG PO TBEC
5.0000 mg | DELAYED_RELEASE_TABLET | Freq: Once | ORAL | Status: AC
  Administered 2020-11-25: 5 mg via ORAL
  Filled 2020-11-25: qty 1

## 2020-11-25 MED ORDER — CHLORHEXIDINE GLUCONATE CLOTH 2 % EX PADS
6.0000 | MEDICATED_PAD | Freq: Once | CUTANEOUS | Status: AC
  Administered 2020-11-25: 6 via TOPICAL

## 2020-11-25 MED ORDER — CHLORHEXIDINE GLUCONATE CLOTH 2 % EX PADS: 6.0000 | MEDICATED_PAD | Freq: Once | CUTANEOUS | Status: DC

## 2020-11-25 MED ORDER — CHLORHEXIDINE GLUCONATE CLOTH 2 % EX PADS
6.0000 | MEDICATED_PAD | Freq: Once | CUTANEOUS | Status: AC
  Administered 2020-11-26: 6 via TOPICAL

## 2020-11-25 MED ORDER — TEMAZEPAM 15 MG PO CAPS: 15.0000 mg | ORAL_CAPSULE | Freq: Once | ORAL | Status: DC | PRN

## 2020-11-25 MED ORDER — METOPROLOL TARTRATE 12.5 MG HALF TABLET
12.5000 mg | ORAL_TABLET | Freq: Once | ORAL | Status: AC
Start: 2020-11-26 — End: 2020-11-26
  Administered 2020-11-26: 12.5 mg via ORAL
  Filled 2020-11-25: qty 1

## 2020-11-25 MED ORDER — CHLORHEXIDINE GLUCONATE 0.12 % MT SOLN
15.0000 mL | Freq: Once | OROMUCOSAL | Status: AC
Start: 2020-11-26 — End: 2020-11-26
  Administered 2020-11-26: 15 mL via OROMUCOSAL
  Filled 2020-11-25: qty 15

## 2020-11-25 NOTE — Anesthesia Preprocedure Evaluation (Addendum)
Anesthesia Evaluation  Patient identified by MRN, date of birth, ID band Patient awake    Reviewed: Allergy & Precautions, NPO status , Patient's Chart, lab work & pertinent test results, reviewed documented beta blocker date and time   Airway Mallampati: III  TM Distance: >3 FB Neck ROM: Full    Dental  (+) Dental Advisory Given, Edentulous Upper, Edentulous Lower   Pulmonary former smoker,  Covid 12/2019   Pulmonary exam normal breath sounds clear to auscultation       Cardiovascular hypertension, Pt. on medications and Pt. on home beta blockers + angina + CAD, + Past MI and + Cardiac Stents  Normal cardiovascular exam Rhythm:Regular Rate:Normal  Echo 11/22/20: 1. Left ventricular ejection fraction, by estimation, is 55 to 60%. The  left ventricle has normal function. The left ventricle has no regional  wall motion abnormalities. Left ventricular diastolic parameters are  consistent with Grade I diastolic  dysfunction (impaired relaxation). The average left ventricular global  longitudinal strain is 20.1 %. The global longitudinal strain is normal.  2. Right ventricular systolic function is normal. The right ventricular  size is normal. There is normal pulmonary artery systolic pressure. The  estimated right ventricular systolic pressure is 61.4 mmHg.  3. The mitral valve is normal in structure. No evidence of mitral valve  regurgitation. No evidence of mitral stenosis.  4. The aortic valve is tricuspid. Aortic valve regurgitation is not  visualized. No aortic stenosis is present.  5. Aortic dilatation noted. There is mild dilatation of the ascending  aorta, measuring 37 mm.  6. The inferior vena cava is normal in size with greater than 50%  respiratory variability, suggesting right atrial pressure of 3 mmHg.    Neuro/Psych negative neurological ROS     GI/Hepatic Neg liver ROS, PUD,   Endo/Other  diabetes, Type 2,  Oral Hypoglycemic Agents  Renal/GU Renal InsufficiencyRenal disease   H/o bladder cancer    Musculoskeletal negative musculoskeletal ROS (+)   Abdominal   Peds  Hematology  (+) Blood dyscrasia, anemia ,   Anesthesia Other Findings   Reproductive/Obstetrics                            Anesthesia Physical Anesthesia Plan  ASA: IV  Anesthesia Plan: General   Post-op Pain Management:    Induction: Intravenous  PONV Risk Score and Plan: 2 and Treatment may vary due to age or medical condition and Midazolam  Airway Management Planned: Oral ETT  Additional Equipment: Arterial line, CVP, PA Cath, TEE and Ultrasound Guidance Line Placement  Intra-op Plan:   Post-operative Plan: Post-operative intubation/ventilation  Informed Consent: I have reviewed the patients History and Physical, chart, labs and discussed the procedure including the risks, benefits and alternatives for the proposed anesthesia with the patient or authorized representative who has indicated his/her understanding and acceptance.     Dental advisory given  Plan Discussed with: CRNA  Anesthesia Plan Comments:        Anesthesia Quick Evaluation

## 2020-11-25 NOTE — Progress Notes (Signed)
Progress Note  Patient Name: Craig Taylor Date of Encounter: 11/25/2020  [facility] Cardiologist: Carlyle Dolly, MD  Subjective   No complaints  Inpatient Medications    Scheduled Meds: . aspirin EC  81 mg Oral Daily  . dorzolamide-timolol  1 drop Right Eye BID  . [START ON 11/26/2020] epinephrine  0-10 mcg/min Intravenous To OR  . [START ON 11/26/2020] heparin-papaverine-plasmalyte irrigation   Irrigation To OR  . insulin aspart  0-5 Units Subcutaneous QHS  . insulin aspart  0-9 Units Subcutaneous TID WC  . [START ON 11/26/2020] insulin   Intravenous To OR  . [START ON 11/26/2020] magnesium sulfate  40 mEq Other To OR  . metoprolol tartrate  50 mg Oral BID  . [START ON 11/26/2020] phenylephrine  30-200 mcg/min Intravenous To OR  . [START ON 11/26/2020] potassium chloride  80 mEq Other To OR  . simvastatin  40 mg Oral QPM  . sodium chloride flush  3 mL Intravenous Q12H  . sodium chloride flush  3 mL Intravenous Q12H  . tamsulosin  0.4 mg Oral QHS  . [START ON 11/26/2020] tranexamic acid  15 mg/kg (Adjusted) Intravenous To OR  . [START ON 11/26/2020] tranexamic acid  2 mg/kg Intracatheter To OR   Continuous Infusions: . sodium chloride    . [START ON 11/26/2020] dexmedetomidine    . [START ON 11/26/2020] heparin 30,000 units/NS 1000 mL solution for CELLSAVER    . heparin 1,900 Units/hr (11/25/20 0100)  . [START ON 11/26/2020] levofloxacin (LEVAQUIN) IV    . [START ON 11/26/2020] milrinone    . [START ON 11/26/2020] nitroGLYCERIN    . [START ON 11/26/2020] norepinephrine    . [START ON 11/26/2020] tranexamic acid (CYKLOKAPRON) infusion ([facility])    . [START ON 11/26/2020] vancomycin     PRN Meds: sodium chloride, acetaminophen, nitroGLYCERIN, ondansetron (ZOFRAN) IV, sodium chloride flush   Vital Signs    Vitals:   11/24/20 1930 11/24/20 2305 11/25/20 0632 11/25/20 0817  BP:  (!) 111/54  (!) 110/95  Pulse:  (!) 58  64  Resp: 20 17  15   Temp: 98.4 F (36.9 C) 98.5 F  (36.9 C)  98.3 F (36.8 C)  TempSrc: Oral Oral  Oral  SpO2:  94%  92%  Weight:   105.8 kg   Height:        Intake/Output Summary (Last 24 hours) at 11/25/2020 0938 Last data filed at 11/25/2020 0828 Gross per 24 hour  Intake 999.06 ml  Output --  Net 999.06 ml   Last 3 Weights 11/25/2020 11/24/2020 11/23/2020  Weight (lbs) 233 lb 4.8 oz 235 lb 235 lb 12.8 oz  Weight (kg) 105.824 kg 106.595 kg 106.958 kg      Telemetry    SR- Personally Reviewed  ECG    n/a - Personally Reviewed  Physical Exam   GEN: No acute distress.   Neck: No JVD Cardiac: RRR, no murmurs, rubs, or gallops.  Respiratory: Clear to auscultation bilaterally. GI: Soft, nontender, non-distended  MS: No edema; No deformity. Neuro:  Nonfocal  Psych: Normal affect   Labs    High Sensitivity Troponin:   Recent Labs  Lab 11/21/20 1140 11/21/20 1500  TROPONINIHS 5 4      Chemistry Recent Labs  Lab 11/23/20 0854 11/24/20 0132 11/25/20 0146  NA 138 139 138  K 4.1 4.1 4.0  CL 109 108 110  CO2 24 23 22   GLUCOSE 141* 117* 132*  BUN 13 12 16   CREATININE  1.14 1.12 1.14  CALCIUM 8.9 9.3 9.0  GFRNONAA >60 >60 >60  ANIONGAP 5 8 6      Hematology Recent Labs  Lab 11/23/20 0302 11/24/20 0132 11/25/20 0146  WBC 5.7 5.6 5.9  RBC 3.88* 4.03* 3.91*  HGB 11.1* 11.5* 11.3*  HCT 33.7* 34.9* 34.0*  MCV 86.9 86.6 87.0  MCH 28.6 28.5 28.9  MCHC 32.9 33.0 33.2  RDW 13.5 13.2 13.3  PLT 207 193 184    BNPNo results for input(s): BNP, PROBNP in the last 168 hours.   DDimer No results for input(s): DDIMER in the last 168 hours.   Radiology    CT CHEST WO CONTRAST  Result Date: 11/23/2020 CLINICAL DATA:  Thoracic aortic disease. Preop for coronary artery bypass graft. EXAM: CT CHEST WITHOUT CONTRAST TECHNIQUE: Multidetector CT imaging of the chest was performed following the standard protocol without IV contrast. COMPARISON:  None. FINDINGS: Cardiovascular: Atherosclerosis of thoracic aorta is noted  without aneurysm formation. Normal cardiac size. No pericardial effusion. 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 Coronary artery calcifications are noted. Mediastinum/Nodes: No enlarged mediastinal or axillary lymph nodes. Thyroid gland, trachea, and esophagus demonstrate no significant findings. Lungs/Pleura: No pneumothorax or pleural effusion is noted. Left lung is clear. 6 mm nodule is noted in right middle lobe adjacent to minor fissure best seen on image number 73 of series 4. Upper Abdomen: No acute abnormality. Musculoskeletal: No chest wall mass or suspicious bone lesions identified. IMPRESSION: 6 mm nodule seen in right middle lobe. Non-contrast chest CT at 6-12 months is recommended. If the nodule is stable at time of repeat CT, then future CT at 18-24 months (from today's scan) is considered optional for low-risk patients, but is recommended for high-risk patients. This recommendation follows the consensus statement: Guidelines for Management of Incidental Pulmonary Nodules Detected on CT Images: From the Fleischner Society 2017; Radiology 2017; 284:228-243. Coronary artery calcifications are noted suggesting coronary artery disease. Aortic Atherosclerosis (ICD10-I70.0). Electronically Signed   By: Marijo Conception M.D.   On: 11/23/2020 13:20   VAS US DOPPLER PRE CABG  Result Date: 11/23/2020 PREOPERATIVE VASCULAR EVALUATION  Indications:      Pre-CABG. Risk Factors:     Hypertension, hyperlipidemia, past history of smoking, prior                   MI, coronary artery disease. Comparison Study: 03-14-2016 Prior carotid artery duplex showed minimal intimal                   thickening of the LT and unremarkable RT carotid. Performing Technologist: Darlin Coco RDMS,RVT  Examination Guidelines: A complete evaluation includes B-mode imaging, spectral Doppler, color Doppler, and power Doppler as needed of all accessible portions of each vessel. Bilateral testing is  considered an integral part of a complete examination. Limited examinations for reoccurring indications may be performed as noted.  Right Carotid Findings: +----------+--------+--------+--------+--------+--------+           PSV cm/sEDV cm/sStenosisDescribeComments +----------+--------+--------+--------+--------+--------+ CCA Prox  122     21                               +----------+--------+--------+--------+--------+--------+ CCA Distal76      16                               +----------+--------+--------+--------+--------+--------+ ICA Prox  57      15                               +----------+--------+--------+--------+--------+--------+ ICA Distal101     21                               +----------+--------+--------+--------+--------+--------+ ECA       103                                      +----------+--------+--------+--------+--------+--------+ Portions of this table do not appear on this page. +----------+--------+-------+----------------+------------+           PSV cm/sEDV cmsDescribe        Arm Pressure +----------+--------+-------+----------------+------------+ Subclavian114            Multiphasic, WNL             +----------+--------+-------+----------------+------------+ +---------+--------+--+--------+--+---------+ VertebralPSV cm/s59EDV cm/s15Antegrade +---------+--------+--+--------+--+---------+ Left Carotid Findings: +----------+--------+--------+--------+---------------------------+--------+           PSV cm/sEDV cm/sStenosisDescribe                   Comments +----------+--------+--------+--------+---------------------------+--------+ CCA Prox  90      12                                                  +----------+--------+--------+--------+---------------------------+--------+ CCA Distal68      19                                                   +----------+--------+--------+--------+---------------------------+--------+ ICA Prox  68      17              mild, focal and hyperechoic         +----------+--------+--------+--------+---------------------------+--------+ ICA Distal65      22                                                  +----------+--------+--------+--------+---------------------------+--------+ ECA       93                                                          +----------+--------+--------+--------+---------------------------+--------+ +----------+--------+--------+----------------+------------+ SubclavianPSV cm/sEDV cm/sDescribe        Arm Pressure +----------+--------+--------+----------------+------------+           168             Multiphasic, WNL             +----------+--------+--------+----------------+------------+ +---------+--------+--+--------+--+---------+ VertebralPSV cm/s66EDV cm/s21Antegrade +---------+--------+--+--------+--+---------+  Summary: Right Carotid: The extracranial vessels were near-normal with only minimal wall                thickening or plaque. Left Carotid: The extracranial vessels were near-normal with  only minimal wall               thickening or plaque.  Electronically signed by Servando Snare MD on 11/23/2020 at 2:26:50 PM.    Final     Cardiac Studies     Patient Profile     80 y.o.malewith a PMH of CAD s/p stent to RCA in 2003, HTN, HLD, DM type 2, bladder cancer s/p TURBT 2020, CKD stage 3a, who presented with chest pain.  Assessment & Plan    1. CAD/Unstable angina - history of RCA stent in 2003 - trops neg x 2, no acute ishcemic EKG changes - 11/2020 echo LVE 55-60%, no WMAs, normal RV function - given typical unstable angina symptoms referred for cath  - 11/2020 cath: mid LM to ostial LAD 60% and 99% side [place] Fails in ostial LCX, mid LAD 45%, RCA mid 70% and distal 60% - CT surgery consulted,  plans for CABG Monday  - medical therapy with  ASA 81, hep gtt, lopressor 50mg  bid, simva 40 - consider ACE/ARB after CABG .  - continue current meds today, CABG tomorrow.    2. Hyperlipidemia - has been on long standing simva 40mg  daily, LDL 54 on admission. Continue current therapy.  - listed as having itching on atorvastatin in the past  For questions or updates, please contact [place] Please consult [email or web address] for contact info under        Signed, Carlyle Dolly, MD  11/25/2020, 9:38 AM

## 2020-11-25 NOTE — Progress Notes (Signed)
ANTICOAGULATION CONSULT NOTE - Follow Up Consult  Pharmacy Consult for IV Heparin Indication: chest pain/ACS  Heparin Dosing Weight: 112 kg  Labs: Recent Labs    11/23/20 0302 11/23/20 0854 11/23/20 1946 11/24/20 0132 11/25/20 0146  HGB 11.1*  --   --  11.5* 11.3*  HCT 33.7*  --   --  34.9* 34.0*  PLT 207  --   --  193 184  HEPARINUNFRC  --  0.20* 0.59 0.58 0.47  CREATININE  --  1.14  --  1.12 1.14    Estimated Creatinine Clearance: 69 mL/min (by C-G formula based on SCr of 1.14 mg/dL).  Assessment: 80 yr old man presented with CP. Pharmacy was consulted to dose heparin (no anticoagulants PTA). Pt is S/P cardiac cath 4/14, which showed severe multi-vessel disease; CVTS planning for CABG 4/18.    Heparin level continues to be therapeutic at 0.47 on heparin 1900 units/hr. CBC stable, no bleeding or IV issues noted.  Goal of Therapy:  Heparin level 0.3-0.7 units/ml Monitor platelets by anticoagulation protocol: Yes   Plan:  Continue heparin infusion at 1900 units/hr  Monitor daily heparin level, CBC CABG tentative for Monday  Romilda Garret, PharmD PGY1 Acute Care Pharmacy Resident 11/25/2020 7:34 AM  Please check [email or web address] for unit specific pharmacy phone numbers.

## 2020-11-25 NOTE — Progress Notes (Signed)
RT NOTES: ABG obtained and sent to lab. Lab tech notified.  

## 2020-11-26 ENCOUNTER — Inpatient Hospital Stay (HOSPITAL_COMMUNITY): Payer: Medicare PPO | Admitting: Certified Registered"

## 2020-11-26 ENCOUNTER — Inpatient Hospital Stay (HOSPITAL_COMMUNITY): Payer: Medicare PPO

## 2020-11-26 ENCOUNTER — Inpatient Hospital Stay (HOSPITAL_COMMUNITY)
Admission: EM | Disposition: A | Discharge status: Home / Self Care | Payer: Self-pay | Source: Home / Self Care | Attending: Cardiothoracic Surgery

## 2020-11-26 DIAGNOSIS — Z951 Presence of aortocoronary bypass graft: Secondary | ICD-10-CM

## 2020-11-26 HISTORY — PX: CORONARY ARTERY BYPASS GRAFT: SHX141

## 2020-11-26 HISTORY — PX: ENDOVEIN HARVEST OF GREATER SAPHENOUS VEIN: SHX5059

## 2020-11-26 HISTORY — PX: TEE WITHOUT CARDIOVERSION: SHX5443

## 2020-11-26 LAB — POCT I-STAT, CHEM 8
BUN: 18 mg/dL (ref 8–23)
BUN: 18 mg/dL (ref 8–23)
BUN: 16 mg/dL (ref 8–23)
BUN: 18 mg/dL (ref 8–23)
BUN: 18 mg/dL (ref 8–23)
Calcium, Ion: 1.34 mmol/L (ref 1.15–1.40)
Calcium, Ion: 1.32 mmol/L (ref 1.15–1.40)
Calcium, Ion: 1.16 mmol/L (ref 1.15–1.40)
Calcium, Ion: 1.18 mmol/L (ref 1.15–1.40)
Calcium, Ion: 1.2 mmol/L (ref 1.15–1.40)
Chloride: 107 mmol/L (ref 98–111)
Chloride: 106 mmol/L (ref 98–111)
Chloride: 106 mmol/L (ref 98–111)
Chloride: 107 mmol/L (ref 98–111)
Chloride: 109 mmol/L (ref 98–111)
Creatinine, Ser: 0.9 mg/dL (ref 0.61–1.24)
Creatinine, Ser: 1.1 mg/dL (ref 0.61–1.24)
Creatinine, Ser: 0.9 mg/dL (ref 0.61–1.24)
Creatinine, Ser: 0.9 mg/dL (ref 0.61–1.24)
Creatinine, Ser: 0.9 mg/dL (ref 0.61–1.24)
Glucose, Bld: 128 mg/dL — ABNORMAL HIGH (ref 70–99)
Glucose, Bld: 138 mg/dL — ABNORMAL HIGH (ref 70–99)
Glucose, Bld: 117 mg/dL — ABNORMAL HIGH (ref 70–99)
Glucose, Bld: 124 mg/dL — ABNORMAL HIGH (ref 70–99)
Glucose, Bld: 125 mg/dL — ABNORMAL HIGH (ref 70–99)
HCT: 34 % — ABNORMAL LOW (ref 39.0–52.0)
HCT: 31 % — ABNORMAL LOW (ref 39.0–52.0)
HCT: 26 % — ABNORMAL LOW (ref 39.0–52.0)
HCT: 27 % — ABNORMAL LOW (ref 39.0–52.0)
HCT: 29 % — ABNORMAL LOW (ref 39.0–52.0)
Hemoglobin: 11.6 g/dL — ABNORMAL LOW (ref 13.0–17.0)
Hemoglobin: 10.5 g/dL — ABNORMAL LOW (ref 13.0–17.0)
Hemoglobin: 8.8 g/dL — ABNORMAL LOW (ref 13.0–17.0)
Hemoglobin: 9.2 g/dL — ABNORMAL LOW (ref 13.0–17.0)
Hemoglobin: 9.9 g/dL — ABNORMAL LOW (ref 13.0–17.0)
Potassium: 4 mmol/L (ref 3.5–5.1)
Potassium: 4.1 mmol/L (ref 3.5–5.1)
Potassium: 5.1 mmol/L (ref 3.5–5.1)
Potassium: 5.1 mmol/L (ref 3.5–5.1)
Potassium: 5.6 mmol/L — ABNORMAL HIGH (ref 3.5–5.1)
Sodium: 142 mmol/L (ref 135–145)
Sodium: 140 mmol/L (ref 135–145)
Sodium: 141 mmol/L (ref 135–145)
Sodium: 140 mmol/L (ref 135–145)
Sodium: 141 mmol/L (ref 135–145)
TCO2: 23 mmol/L (ref 22–32)
TCO2: 23 mmol/L (ref 22–32)
TCO2: 25 mmol/L (ref 22–32)
TCO2: 21 mmol/L — ABNORMAL LOW (ref 22–32)
TCO2: 24 mmol/L (ref 22–32)

## 2020-11-26 LAB — POCT I-STAT 7, (LYTES, BLD GAS, ICA,H+H)
Acid-base deficit: 3 mmol/L — ABNORMAL HIGH (ref 0.0–2.0)
Acid-base deficit: 1 mmol/L (ref 0.0–2.0)
Acid-base deficit: 4 mmol/L — ABNORMAL HIGH (ref 0.0–2.0)
Acid-base deficit: 5 mmol/L — ABNORMAL HIGH (ref 0.0–2.0)
Acid-base deficit: 2 mmol/L (ref 0.0–2.0)
Acid-base deficit: 4 mmol/L — ABNORMAL HIGH (ref 0.0–2.0)
Acid-base deficit: 3 mmol/L — ABNORMAL HIGH (ref 0.0–2.0)
Acid-base deficit: 4 mmol/L — ABNORMAL HIGH (ref 0.0–2.0)
Bicarbonate: 24 mmol/L (ref 20.0–28.0)
Bicarbonate: 25.8 mmol/L (ref 20.0–28.0)
Bicarbonate: 21.2 mmol/L (ref 20.0–28.0)
Bicarbonate: 20.5 mmol/L (ref 20.0–28.0)
Bicarbonate: 23.4 mmol/L (ref 20.0–28.0)
Bicarbonate: 22.3 mmol/L (ref 20.0–28.0)
Bicarbonate: 23.5 mmol/L (ref 20.0–28.0)
Bicarbonate: 22.2 mmol/L (ref 20.0–28.0)
Calcium, Ion: 1.33 mmol/L (ref 1.15–1.40)
Calcium, Ion: 1.12 mmol/L — ABNORMAL LOW (ref 1.15–1.40)
Calcium, Ion: 1.19 mmol/L (ref 1.15–1.40)
Calcium, Ion: 1.21 mmol/L (ref 1.15–1.40)
Calcium, Ion: 1.17 mmol/L (ref 1.15–1.40)
Calcium, Ion: 1.19 mmol/L (ref 1.15–1.40)
Calcium, Ion: 1.24 mmol/L (ref 1.15–1.40)
Calcium, Ion: 1.21 mmol/L (ref 1.15–1.40)
HCT: 34 % — ABNORMAL LOW (ref 39.0–52.0)
HCT: 26 % — ABNORMAL LOW (ref 39.0–52.0)
HCT: 27 % — ABNORMAL LOW (ref 39.0–52.0)
HCT: 30 % — ABNORMAL LOW (ref 39.0–52.0)
HCT: 27 % — ABNORMAL LOW (ref 39.0–52.0)
HCT: 30 % — ABNORMAL LOW (ref 39.0–52.0)
HCT: 30 % — ABNORMAL LOW (ref 39.0–52.0)
HCT: 29 % — ABNORMAL LOW (ref 39.0–52.0)
Hemoglobin: 11.6 g/dL — ABNORMAL LOW (ref 13.0–17.0)
Hemoglobin: 8.8 g/dL — ABNORMAL LOW (ref 13.0–17.0)
Hemoglobin: 9.2 g/dL — ABNORMAL LOW (ref 13.0–17.0)
Hemoglobin: 10.2 g/dL — ABNORMAL LOW (ref 13.0–17.0)
Hemoglobin: 9.2 g/dL — ABNORMAL LOW (ref 13.0–17.0)
Hemoglobin: 10.2 g/dL — ABNORMAL LOW (ref 13.0–17.0)
Hemoglobin: 10.2 g/dL — ABNORMAL LOW (ref 13.0–17.0)
Hemoglobin: 9.9 g/dL — ABNORMAL LOW (ref 13.0–17.0)
O2 Saturation: 100 %
O2 Saturation: 100 %
O2 Saturation: 94 %
O2 Saturation: 92 %
O2 Saturation: 99 %
O2 Saturation: 97 %
O2 Saturation: 93 %
O2 Saturation: 95 %
Patient temperature: 35.8
Patient temperature: 36
Patient temperature: 36.7
Patient temperature: 37.1
Patient temperature: 37.5
Potassium: 4 mmol/L (ref 3.5–5.1)
Potassium: 4.2 mmol/L (ref 3.5–5.1)
Potassium: 5 mmol/L (ref 3.5–5.1)
Potassium: 4.5 mmol/L (ref 3.5–5.1)
Potassium: 3.8 mmol/L (ref 3.5–5.1)
Potassium: 4 mmol/L (ref 3.5–5.1)
Potassium: 3.9 mmol/L (ref 3.5–5.1)
Potassium: 3.9 mmol/L (ref 3.5–5.1)
Sodium: 142 mmol/L (ref 135–145)
Sodium: 142 mmol/L (ref 135–145)
Sodium: 140 mmol/L (ref 135–145)
Sodium: 140 mmol/L (ref 135–145)
Sodium: 143 mmol/L (ref 135–145)
Sodium: 142 mmol/L (ref 135–145)
Sodium: 142 mmol/L (ref 135–145)
Sodium: 142 mmol/L (ref 135–145)
TCO2: 25 mmol/L (ref 22–32)
TCO2: 27 mmol/L (ref 22–32)
TCO2: 22 mmol/L (ref 22–32)
TCO2: 22 mmol/L (ref 22–32)
TCO2: 25 mmol/L (ref 22–32)
TCO2: 24 mmol/L (ref 22–32)
TCO2: 25 mmol/L (ref 22–32)
TCO2: 24 mmol/L (ref 22–32)
pCO2 arterial: 48 mmHg (ref 32.0–48.0)
pCO2 arterial: 54.2 mmHg — ABNORMAL HIGH (ref 32.0–48.0)
pCO2 arterial: 38.6 mmHg (ref 32.0–48.0)
pCO2 arterial: 38.8 mmHg (ref 32.0–48.0)
pCO2 arterial: 39.6 mmHg (ref 32.0–48.0)
pCO2 arterial: 42.8 mmHg (ref 32.0–48.0)
pCO2 arterial: 48.2 mmHg — ABNORMAL HIGH (ref 32.0–48.0)
pCO2 arterial: 45.8 mmHg (ref 32.0–48.0)
pH, Arterial: 7.306 — ABNORMAL LOW (ref 7.350–7.450)
pH, Arterial: 7.286 — ABNORMAL LOW (ref 7.350–7.450)
pH, Arterial: 7.347 — ABNORMAL LOW (ref 7.350–7.450)
pH, Arterial: 7.324 — ABNORMAL LOW (ref 7.350–7.450)
pH, Arterial: 7.374 (ref 7.350–7.450)
pH, Arterial: 7.324 — ABNORMAL LOW (ref 7.350–7.450)
pH, Arterial: 7.296 — ABNORMAL LOW (ref 7.350–7.450)
pH, Arterial: 7.295 — ABNORMAL LOW (ref 7.350–7.450)
pO2, Arterial: 421 mmHg — ABNORMAL HIGH (ref 83.0–108.0)
pO2, Arterial: 411 mmHg — ABNORMAL HIGH (ref 83.0–108.0)
pO2, Arterial: 75 mmHg — ABNORMAL LOW (ref 83.0–108.0)
pO2, Arterial: 66 mmHg — ABNORMAL LOW (ref 83.0–108.0)
pO2, Arterial: 127 mmHg — ABNORMAL HIGH (ref 83.0–108.0)
pO2, Arterial: 99 mmHg (ref 83.0–108.0)
pO2, Arterial: 76 mmHg — ABNORMAL LOW (ref 83.0–108.0)
pO2, Arterial: 84 mmHg (ref 83.0–108.0)

## 2020-11-26 LAB — GLUCOSE, CAPILLARY
Glucose-Capillary: 143 mg/dL — ABNORMAL HIGH (ref 70–99)
Glucose-Capillary: 138 mg/dL — ABNORMAL HIGH (ref 70–99)
Glucose-Capillary: 90 mg/dL (ref 70–99)
Glucose-Capillary: 142 mg/dL — ABNORMAL HIGH (ref 70–99)
Glucose-Capillary: 124 mg/dL — ABNORMAL HIGH (ref 70–99)
Glucose-Capillary: 126 mg/dL — ABNORMAL HIGH (ref 70–99)
Glucose-Capillary: 117 mg/dL — ABNORMAL HIGH (ref 70–99)
Glucose-Capillary: 161 mg/dL — ABNORMAL HIGH (ref 70–99)
Glucose-Capillary: 138 mg/dL — ABNORMAL HIGH (ref 70–99)
Glucose-Capillary: 133 mg/dL — ABNORMAL HIGH (ref 70–99)
Glucose-Capillary: 130 mg/dL — ABNORMAL HIGH (ref 70–99)

## 2020-11-26 LAB — BASIC METABOLIC PANEL
Anion gap: 6 (ref 5–15)
Anion gap: 6 (ref 5–15)
BUN: 15 mg/dL (ref 8–23)
BUN: 16 mg/dL (ref 8–23)
CO2: 23 mmol/L (ref 22–32)
CO2: 23 mmol/L (ref 22–32)
Calcium: 9.1 mg/dL (ref 8.9–10.3)
Calcium: 8.2 mg/dL — ABNORMAL LOW (ref 8.9–10.3)
Chloride: 108 mmol/L (ref 98–111)
Chloride: 111 mmol/L (ref 98–111)
Creatinine, Ser: 1.16 mg/dL (ref 0.61–1.24)
Creatinine, Ser: 1.04 mg/dL (ref 0.61–1.24)
GFR, Estimated: >60 mL/min (ref >60)
GFR, Estimated: >60 mL/min (ref >60)
Glucose, Bld: 133 mg/dL — ABNORMAL HIGH (ref 70–99)
Glucose, Bld: 116 mg/dL — ABNORMAL HIGH (ref 70–99)
Potassium: 3.8 mmol/L (ref 3.5–5.1)
Potassium: 3.8 mmol/L (ref 3.5–5.1)
Sodium: 137 mmol/L (ref 135–145)
Sodium: 140 mmol/L (ref 135–145)

## 2020-11-26 LAB — CBC
HCT: 36.1 % — ABNORMAL LOW (ref 39.0–52.0)
HCT: 30.4 % — ABNORMAL LOW (ref 39.0–52.0)
HCT: 31.3 % — ABNORMAL LOW (ref 39.0–52.0)
Hemoglobin: 11.8 g/dL — ABNORMAL LOW (ref 13.0–17.0)
Hemoglobin: 9.8 g/dL — ABNORMAL LOW (ref 13.0–17.0)
Hemoglobin: 10 g/dL — ABNORMAL LOW (ref 13.0–17.0)
MCH: 28 pg (ref 26.0–34.0)
MCH: 28.2 pg (ref 26.0–34.0)
MCH: 28 pg (ref 26.0–34.0)
MCHC: 32.7 g/dL (ref 30.0–36.0)
MCHC: 32.2 g/dL (ref 30.0–36.0)
MCHC: 31.9 g/dL (ref 30.0–36.0)
MCV: 85.5 fL (ref 80.0–100.0)
MCV: 87.4 fL (ref 80.0–100.0)
MCV: 87.7 fL (ref 80.0–100.0)
Platelets: 206 10*3/uL (ref 150–400)
Platelets: 153 10*3/uL (ref 150–400)
Platelets: 170 10*3/uL (ref 150–400)
RBC: 4.22 MIL/uL (ref 4.22–5.81)
RBC: 3.48 MIL/uL — ABNORMAL LOW (ref 4.22–5.81)
RBC: 3.57 MIL/uL — ABNORMAL LOW (ref 4.22–5.81)
RDW: 13.3 % (ref 11.5–15.5)
RDW: 13.3 % (ref 11.5–15.5)
RDW: 13.3 % (ref 11.5–15.5)
WBC: 5.8 10*3/uL (ref 4.0–10.5)
WBC: 7.8 10*3/uL (ref 4.0–10.5)
WBC: 9.6 10*3/uL (ref 4.0–10.5)
nRBC: 0 % (ref 0.0–0.2)
nRBC: 0 % (ref 0.0–0.2)
nRBC: 0 % (ref 0.0–0.2)

## 2020-11-26 LAB — PROTIME-INR
INR: 1.2 (ref 0.8–1.2)
Prothrombin Time: 15.5 seconds — ABNORMAL HIGH (ref 11.4–15.2)

## 2020-11-26 LAB — POCT I-STAT EG7
Acid-base deficit: 3 mmol/L — ABNORMAL HIGH (ref 0.0–2.0)
Bicarbonate: 24.3 mmol/L (ref 20.0–28.0)
Calcium, Ion: 1.18 mmol/L (ref 1.15–1.40)
HCT: 28 % — ABNORMAL LOW (ref 39.0–52.0)
Hemoglobin: 9.5 g/dL — ABNORMAL LOW (ref 13.0–17.0)
O2 Saturation: 84 %
Potassium: 4 mmol/L (ref 3.5–5.1)
Sodium: 143 mmol/L (ref 135–145)
TCO2: 26 mmol/L (ref 22–32)
pCO2, Ven: 55.5 mmHg (ref 44.0–60.0)
pH, Ven: 7.249 — ABNORMAL LOW (ref 7.250–7.430)
pO2, Ven: 58 mmHg — ABNORMAL HIGH (ref 32.0–45.0)

## 2020-11-26 LAB — ECHO INTRAOPERATIVE TEE
Height: 78 in
Weight: 3728.42 oz

## 2020-11-26 LAB — HEMOGLOBIN AND HEMATOCRIT, BLOOD
HCT: 27.3 % — ABNORMAL LOW (ref 39.0–52.0)
Hemoglobin: 9 g/dL — ABNORMAL LOW (ref 13.0–17.0)

## 2020-11-26 LAB — PLATELET COUNT: Platelets: 196 10*3/uL (ref 150–400)

## 2020-11-26 LAB — HEPARIN LEVEL (UNFRACTIONATED): Heparin Unfractionated: 0.58 IU/mL (ref 0.30–0.70)

## 2020-11-26 LAB — APTT: aPTT: 34 seconds (ref 24–36)

## 2020-11-26 LAB — SURGICAL PCR SCREEN
MRSA, PCR: NEGATIVE
Staphylococcus aureus: NEGATIVE

## 2020-11-26 LAB — MAGNESIUM: Magnesium: 2.7 mg/dL — ABNORMAL HIGH (ref 1.7–2.4)

## 2020-11-26 SURGERY — CORONARY ARTERY BYPASS GRAFTING (CABG)
Anesthesia: General | Site: Chest | Laterality: Right

## 2020-11-26 MED ORDER — ACETAMINOPHEN 160 MG/5ML PO SOLN: 1000.0000 mg | Freq: Four times a day (QID) | ORAL | Status: DC

## 2020-11-26 MED ORDER — NITROGLYCERIN 0.2 MG/ML ON CALL CATH LAB
INTRAVENOUS | Status: DC | PRN
  Administered 2020-11-26 (×6): 40 ug via INTRAVENOUS

## 2020-11-26 MED ORDER — 0.9 % SODIUM CHLORIDE (POUR BTL) OPTIME
TOPICAL | Status: DC | PRN
  Administered 2020-11-26: 5000 mL

## 2020-11-26 MED ORDER — SODIUM CHLORIDE 0.9% FLUSH
10.0000 mL | Freq: Two times a day (BID) | INTRAVENOUS | Status: DC
  Administered 2020-11-27: 10 mL

## 2020-11-26 MED ORDER — SODIUM BICARBONATE 8.4 % IV SOLN
100.0000 meq | Freq: Once | INTRAVENOUS | Status: AC
  Administered 2020-11-26: 100 meq via INTRAVENOUS

## 2020-11-26 MED ORDER — LEVALBUTEROL HCL 0.63 MG/3ML IN NEBU
INHALATION_SOLUTION | RESPIRATORY_TRACT | Status: AC
  Administered 2020-11-26: 0.63 mg
  Filled 2020-11-26: qty 3

## 2020-11-26 MED ORDER — BUPIVACAINE LIPOSOME 1.3 % IJ SUSP
INTRAMUSCULAR | Status: AC
  Filled 2020-11-26: qty 20

## 2020-11-26 MED ORDER — SODIUM CHLORIDE 0.9 % IV SOLN: 250.0000 mL | INTRAVENOUS | Status: DC

## 2020-11-26 MED ORDER — FENTANYL CITRATE (PF) 250 MCG/5ML IJ SOLN
INTRAMUSCULAR | Status: DC | PRN
  Administered 2020-11-26: 100 ug via INTRAVENOUS
  Administered 2020-11-26: 250 ug via INTRAVENOUS
  Administered 2020-11-26 (×2): 100 ug via INTRAVENOUS
  Administered 2020-11-26: 250 ug via INTRAVENOUS
  Administered 2020-11-26: 200 ug via INTRAVENOUS
  Administered 2020-11-26: 100 ug via INTRAVENOUS
  Administered 2020-11-26: 50 ug via INTRAVENOUS
  Administered 2020-11-26: 100 ug via INTRAVENOUS

## 2020-11-26 MED ORDER — POTASSIUM CHLORIDE 10 MEQ/50ML IV SOLN
10.0000 meq | INTRAVENOUS | Status: DC | PRN
  Filled 2020-11-26: qty 50

## 2020-11-26 MED ORDER — MIDAZOLAM HCL (PF) 10 MG/2ML IJ SOLN
INTRAMUSCULAR | Status: AC
  Filled 2020-11-26: qty 2

## 2020-11-26 MED ORDER — PANTOPRAZOLE SODIUM 40 MG PO TBEC
40.0000 mg | DELAYED_RELEASE_TABLET | Freq: Every day | ORAL | Status: DC
  Administered 2020-11-28 – 2020-12-01 (×4): 40 mg via ORAL
  Filled 2020-11-26 (×4): qty 1

## 2020-11-26 MED ORDER — SODIUM CHLORIDE 0.9% FLUSH
3.0000 mL | Freq: Two times a day (BID) | INTRAVENOUS | Status: DC
  Administered 2020-11-27: 3 mL via INTRAVENOUS

## 2020-11-26 MED ORDER — DOCUSATE SODIUM 100 MG PO CAPS
200.0000 mg | ORAL_CAPSULE | Freq: Every day | ORAL | Status: DC
  Administered 2020-11-27 – 2020-11-29 (×3): 200 mg via ORAL
  Filled 2020-11-26 (×4): qty 2

## 2020-11-26 MED ORDER — THROMBIN 5000 UNITS EX SOLR
INTRAVENOUS | Status: DC | PRN
  Administered 2020-11-26: 2 mL

## 2020-11-26 MED ORDER — ACETAMINOPHEN 500 MG PO TABS
1000.0000 mg | ORAL_TABLET | Freq: Four times a day (QID) | ORAL | Status: DC
  Administered 2020-11-27 – 2020-12-01 (×16): 1000 mg via ORAL
  Filled 2020-11-26 (×16): qty 2

## 2020-11-26 MED ORDER — PROTAMINE SULFATE 10 MG/ML IV SOLN
INTRAVENOUS | Status: AC
  Filled 2020-11-26: qty 15

## 2020-11-26 MED ORDER — HEPARIN SODIUM (PORCINE) 1000 UNIT/ML IJ SOLN
INTRAMUSCULAR | Status: AC
  Filled 2020-11-26: qty 1

## 2020-11-26 MED ORDER — METOPROLOL TARTRATE 25 MG/10 ML ORAL SUSPENSION
12.5000 mg | Freq: Two times a day (BID) | ORAL | Status: DC
  Filled 2020-11-26: qty 5

## 2020-11-26 MED ORDER — LEVOFLOXACIN IN D5W 750 MG/150ML IV SOLN
750.0000 mg | INTRAVENOUS | Status: AC
  Administered 2020-11-27: 750 mg via INTRAVENOUS
  Filled 2020-11-26: qty 150

## 2020-11-26 MED ORDER — INSULIN REGULAR(HUMAN) IN NACL 100-0.9 UT/100ML-% IV SOLN: INTRAVENOUS | Status: DC

## 2020-11-26 MED ORDER — EPHEDRINE SULFATE 50 MG/ML IJ SOLN
INTRAMUSCULAR | Status: DC | PRN
  Administered 2020-11-26 (×2): 5 mg via INTRAVENOUS

## 2020-11-26 MED ORDER — MIDAZOLAM HCL 2 MG/2ML IJ SOLN
2.0000 mg | INTRAMUSCULAR | Status: DC | PRN
  Administered 2020-11-26: 2 mg via INTRAVENOUS

## 2020-11-26 MED ORDER — ALBUMIN HUMAN 5 % IV SOLN
250.0000 mL | INTRAVENOUS | Status: AC | PRN
  Administered 2020-11-26 (×3): 12.5 g via INTRAVENOUS
  Filled 2020-11-26 (×2): qty 250

## 2020-11-26 MED ORDER — ASPIRIN EC 325 MG PO TBEC
325.0000 mg | DELAYED_RELEASE_TABLET | Freq: Every day | ORAL | Status: DC
  Administered 2020-11-27 – 2020-11-29 (×3): 325 mg via ORAL
  Filled 2020-11-26 (×3): qty 1

## 2020-11-26 MED ORDER — SODIUM CHLORIDE 0.9% FLUSH: 3.0000 mL | INTRAVENOUS | Status: DC | PRN

## 2020-11-26 MED ORDER — LACTATED RINGERS IV SOLN
500.0000 mL | Freq: Once | INTRAVENOUS | Status: AC | PRN
  Administered 2020-11-27: 500 mL via INTRAVENOUS

## 2020-11-26 MED ORDER — ASPIRIN 81 MG PO CHEW: 324.0000 mg | CHEWABLE_TABLET | Freq: Every day | ORAL | Status: DC

## 2020-11-26 MED ORDER — FAMOTIDINE IN NACL 20-0.9 MG/50ML-% IV SOLN
20.0000 mg | Freq: Two times a day (BID) | INTRAVENOUS | Status: AC
  Administered 2020-11-26 (×2): 20 mg via INTRAVENOUS
  Filled 2020-11-26 (×4): qty 50

## 2020-11-26 MED ORDER — LEVALBUTEROL HCL 0.63 MG/3ML IN NEBU
0.6300 mg | INHALATION_SOLUTION | Freq: Four times a day (QID) | RESPIRATORY_TRACT | Status: DC
  Administered 2020-11-26 – 2020-11-27 (×2): 0.63 mg via RESPIRATORY_TRACT
  Filled 2020-11-26 (×2): qty 3

## 2020-11-26 MED ORDER — NITROGLYCERIN IN D5W 200-5 MCG/ML-% IV SOLN: 0.0000 ug/min | INTRAVENOUS | Status: DC

## 2020-11-26 MED ORDER — OXYCODONE HCL 5 MG PO TABS
5.0000 mg | ORAL_TABLET | ORAL | Status: DC | PRN
  Administered 2020-11-27: 5 mg via ORAL
  Administered 2020-11-27 – 2020-11-28 (×2): 10 mg via ORAL
  Filled 2020-11-26: qty 1
  Filled 2020-11-26 (×2): qty 2

## 2020-11-26 MED ORDER — FENTANYL CITRATE (PF) 250 MCG/5ML IJ SOLN
INTRAMUSCULAR | Status: AC
  Filled 2020-11-26: qty 25

## 2020-11-26 MED ORDER — DEXMEDETOMIDINE HCL IN NACL 400 MCG/100ML IV SOLN
0.0000 ug/kg/h | INTRAVENOUS | Status: DC
  Administered 2020-11-26: 0.7 ug/kg/h via INTRAVENOUS
  Filled 2020-11-26 (×2): qty 100

## 2020-11-26 MED ORDER — ALBUMIN HUMAN 5 % IV SOLN: INTRAVENOUS | Status: DC | PRN

## 2020-11-26 MED ORDER — ACETAMINOPHEN 650 MG RE SUPP
650.0000 mg | Freq: Once | RECTAL | Status: AC
  Administered 2020-11-26: 650 mg via RECTAL

## 2020-11-26 MED ORDER — ARTIFICIAL TEARS OPHTHALMIC OINT
TOPICAL_OINTMENT | OPHTHALMIC | Status: DC | PRN
  Administered 2020-11-26: 1 via OPHTHALMIC

## 2020-11-26 MED ORDER — LACTATED RINGERS IV SOLN: INTRAVENOUS | Status: DC | PRN

## 2020-11-26 MED ORDER — STERILE WATER FOR INJECTION IJ SOLN
INTRAMUSCULAR | Status: AC
  Filled 2020-11-26: qty 10

## 2020-11-26 MED ORDER — PLASMA-LYTE A IV SOLN: INTRAVENOUS | Status: DC

## 2020-11-26 MED ORDER — BUPIVACAINE HCL (PF) 0.5 % IJ SOLN
INTRAMUSCULAR | Status: AC
  Filled 2020-11-26: qty 30

## 2020-11-26 MED ORDER — LACTATED RINGERS IV SOLN: INTRAVENOUS | Status: DC

## 2020-11-26 MED ORDER — SODIUM CHLORIDE 0.9 % IV SOLN: INTRAVENOUS | Status: DC | PRN

## 2020-11-26 MED ORDER — VANCOMYCIN HCL IN DEXTROSE 1-5 GM/200ML-% IV SOLN
1000.0000 mg | Freq: Once | INTRAVENOUS | Status: AC
  Administered 2020-11-26: 1000 mg via INTRAVENOUS
  Filled 2020-11-26: qty 200

## 2020-11-26 MED ORDER — PROTAMINE SULFATE 10 MG/ML IV SOLN
INTRAVENOUS | Status: AC
  Filled 2020-11-26: qty 25

## 2020-11-26 MED ORDER — POTASSIUM CHLORIDE 10 MEQ/50ML IV SOLN
10.0000 meq | INTRAVENOUS | Status: DC
  Filled 2020-11-26 (×3): qty 50

## 2020-11-26 MED ORDER — TRAMADOL HCL 50 MG PO TABS
50.0000 mg | ORAL_TABLET | ORAL | Status: DC | PRN
  Administered 2020-11-29: 100 mg via ORAL
  Filled 2020-11-26: qty 2

## 2020-11-26 MED ORDER — MORPHINE SULFATE (PF) 2 MG/ML IV SOLN
1.0000 mg | INTRAVENOUS | Status: DC | PRN
  Administered 2020-11-26: 2 mg via INTRAVENOUS
  Administered 2020-11-26 (×3): 4 mg via INTRAVENOUS
  Filled 2020-11-26 (×3): qty 2
  Filled 2020-11-26: qty 1

## 2020-11-26 MED ORDER — STERILE WATER FOR INJECTION IJ SOLN
INTRAMUSCULAR | Status: DC | PRN
  Administered 2020-11-26: 10 mL

## 2020-11-26 MED ORDER — ARTIFICIAL TEARS OPHTHALMIC OINT
TOPICAL_OINTMENT | OPHTHALMIC | Status: AC
  Filled 2020-11-26: qty 3.5

## 2020-11-26 MED ORDER — BISACODYL 5 MG PO TBEC
10.0000 mg | DELAYED_RELEASE_TABLET | Freq: Every day | ORAL | Status: DC
  Administered 2020-11-27 – 2020-11-29 (×3): 10 mg via ORAL
  Filled 2020-11-26 (×4): qty 2

## 2020-11-26 MED ORDER — VANCOMYCIN HCL 1000 MG IV SOLR
INTRAVENOUS | Status: DC | PRN
  Administered 2020-11-26: 3000 mg

## 2020-11-26 MED ORDER — MAGNESIUM SULFATE 4 GM/100ML IV SOLN
4.0000 g | Freq: Once | INTRAVENOUS | Status: AC
  Administered 2020-11-26: 4 g via INTRAVENOUS
  Filled 2020-11-26 (×2): qty 100

## 2020-11-26 MED ORDER — ONDANSETRON HCL 4 MG/2ML IJ SOLN
4.0000 mg | Freq: Four times a day (QID) | INTRAMUSCULAR | Status: DC | PRN
  Administered 2020-11-26 – 2020-11-29 (×6): 4 mg via INTRAVENOUS
  Filled 2020-11-26 (×6): qty 2

## 2020-11-26 MED ORDER — PHENYLEPHRINE 40 MCG/ML (10ML) SYRINGE FOR IV PUSH (FOR BLOOD PRESSURE SUPPORT)
PREFILLED_SYRINGE | INTRAVENOUS | Status: AC
  Filled 2020-11-26: qty 10

## 2020-11-26 MED ORDER — VANCOMYCIN HCL 1000 MG IV SOLR
INTRAVENOUS | Status: AC
  Filled 2020-11-26: qty 3000

## 2020-11-26 MED ORDER — KETOROLAC TROMETHAMINE 15 MG/ML IJ SOLN
7.5000 mg | Freq: Four times a day (QID) | INTRAMUSCULAR | Status: AC
  Administered 2020-11-26 – 2020-11-27 (×2): 7.5 mg via INTRAVENOUS
  Filled 2020-11-26 (×2): qty 1

## 2020-11-26 MED ORDER — CHLORHEXIDINE GLUCONATE CLOTH 2 % EX PADS
6.0000 | MEDICATED_PAD | Freq: Every day | CUTANEOUS | Status: DC
  Administered 2020-11-26 – 2020-12-01 (×6): 6 via TOPICAL

## 2020-11-26 MED ORDER — VECURONIUM BROMIDE 10 MG IV SOLR
INTRAVENOUS | Status: DC | PRN
  Administered 2020-11-26 (×2): 5 mg via INTRAVENOUS

## 2020-11-26 MED ORDER — SODIUM CHLORIDE 0.9 % IV SOLN: INTRAVENOUS | Status: DC

## 2020-11-26 MED ORDER — SODIUM CHLORIDE 0.45 % IV SOLN: INTRAVENOUS | Status: DC | PRN

## 2020-11-26 MED ORDER — ROCURONIUM BROMIDE 10 MG/ML (PF) SYRINGE
PREFILLED_SYRINGE | INTRAVENOUS | Status: DC | PRN
  Administered 2020-11-26: 100 mg via INTRAVENOUS

## 2020-11-26 MED ORDER — HEPARIN SODIUM (PORCINE) 1000 UNIT/ML IJ SOLN
INTRAMUSCULAR | Status: DC | PRN
  Administered 2020-11-26: 37000 [IU] via INTRAVENOUS

## 2020-11-26 MED ORDER — PHENYLEPHRINE HCL (PRESSORS) 10 MG/ML IV SOLN
INTRAVENOUS | Status: DC | PRN
  Administered 2020-11-26: 40 ug via INTRAVENOUS
  Administered 2020-11-26: 80 ug via INTRAVENOUS
  Administered 2020-11-26: 40 ug via INTRAVENOUS
  Administered 2020-11-26: 120 ug via INTRAVENOUS

## 2020-11-26 MED ORDER — PROPOFOL 10 MG/ML IV BOLUS
INTRAVENOUS | Status: AC
  Filled 2020-11-26: qty 20

## 2020-11-26 MED ORDER — PHENYLEPHRINE HCL-NACL 20-0.9 MG/250ML-% IV SOLN
INTRAVENOUS | Status: DC | PRN
  Administered 2020-11-26: 20 ug/min via INTRAVENOUS

## 2020-11-26 MED ORDER — LEVALBUTEROL TARTRATE 45 MCG/ACT IN AERO
2.0000 | INHALATION_SPRAY | Freq: Four times a day (QID) | RESPIRATORY_TRACT | Status: DC
  Filled 2020-11-26: qty 15

## 2020-11-26 MED ORDER — EPINEPHRINE 1 MG/10ML IJ SOSY
PREFILLED_SYRINGE | INTRAMUSCULAR | Status: AC
  Filled 2020-11-26: qty 10

## 2020-11-26 MED ORDER — VECURONIUM BROMIDE 10 MG IV SOLR
INTRAVENOUS | Status: AC
  Filled 2020-11-26: qty 10

## 2020-11-26 MED ORDER — PHENYLEPHRINE HCL-NACL 20-0.9 MG/250ML-% IV SOLN
0.0000 ug/min | INTRAVENOUS | Status: DC
  Filled 2020-11-26: qty 250

## 2020-11-26 MED ORDER — PROPOFOL 10 MG/ML IV BOLUS
INTRAVENOUS | Status: DC | PRN
  Administered 2020-11-26 (×2): 50 mg via INTRAVENOUS

## 2020-11-26 MED ORDER — PLATELET POOR PLASMA OPTIME
Status: DC | PRN
  Administered 2020-11-26: 10 mL

## 2020-11-26 MED ORDER — PLATELET RICH PLASMA OPTIME
Status: DC | PRN
  Administered 2020-11-26: 10 mL

## 2020-11-26 MED ORDER — HEMOSTATIC AGENTS (NO CHARGE) OPTIME
TOPICAL | Status: DC | PRN
  Administered 2020-11-26 (×2): 1 via TOPICAL

## 2020-11-26 MED ORDER — PROTAMINE SULFATE 10 MG/ML IV SOLN
INTRAVENOUS | Status: DC | PRN
  Administered 2020-11-26: 370 mg via INTRAVENOUS

## 2020-11-26 MED ORDER — DEXTROSE 50 % IV SOLN: 0.0000 mL | INTRAVENOUS | Status: DC | PRN

## 2020-11-26 MED ORDER — SODIUM CHLORIDE 0.9% FLUSH: 10.0000 mL | INTRAVENOUS | Status: DC | PRN

## 2020-11-26 MED ORDER — ROCURONIUM BROMIDE 10 MG/ML (PF) SYRINGE
PREFILLED_SYRINGE | INTRAVENOUS | Status: AC
  Filled 2020-11-26: qty 10

## 2020-11-26 MED ORDER — CHLORHEXIDINE GLUCONATE 0.12 % MT SOLN
15.0000 mL | OROMUCOSAL | Status: AC
  Administered 2020-11-26: 15 mL via OROMUCOSAL

## 2020-11-26 MED ORDER — ACETAMINOPHEN 160 MG/5ML PO SOLN: 650.0000 mg | Freq: Once | ORAL | Status: AC

## 2020-11-26 MED ORDER — METOPROLOL TARTRATE 5 MG/5ML IV SOLN
2.5000 mg | INTRAVENOUS | Status: DC | PRN
  Administered 2020-11-27: 5 mg via INTRAVENOUS
  Filled 2020-11-26: qty 5

## 2020-11-26 MED ORDER — SODIUM CHLORIDE (PF) 0.9 % IJ SOLN
INTRAMUSCULAR | Status: AC
  Filled 2020-11-26: qty 20

## 2020-11-26 MED ORDER — MIDAZOLAM HCL 5 MG/5ML IJ SOLN
INTRAMUSCULAR | Status: DC | PRN
  Administered 2020-11-26: 2 mg via INTRAVENOUS
  Administered 2020-11-26: 1 mg via INTRAVENOUS

## 2020-11-26 MED ORDER — METOPROLOL TARTRATE 12.5 MG HALF TABLET
12.5000 mg | ORAL_TABLET | Freq: Two times a day (BID) | ORAL | Status: DC
  Administered 2020-11-27 – 2020-11-28 (×4): 12.5 mg via ORAL
  Filled 2020-11-26 (×5): qty 1

## 2020-11-26 MED ORDER — BISACODYL 10 MG RE SUPP: 10.0000 mg | Freq: Every day | RECTAL | Status: DC

## 2020-11-26 MED ORDER — PLASMA-LYTE 148 IV SOLN
INTRAVENOUS | Status: DC | PRN
  Administered 2020-11-26: 500 mL via INTRAVASCULAR

## 2020-11-26 SURGICAL SUPPLY — 88 items
ADAPTER CARDIO PERF ANTE/RETRO (ADAPTER) ×6 IMPLANT
ADH SKN CLS APL DERMABOND .7 (GAUZE/BANDAGES/DRESSINGS) ×4
ADPR PRFSN 84XANTGRD RTRGD (ADAPTER) ×4
APL SRG 7X2 LUM MLBL SLNT (VASCULAR PRODUCTS)
APPLICATOR TIP COSEAL (VASCULAR PRODUCTS) IMPLANT
BAG DECANTER FOR FLEXI CONT (MISCELLANEOUS) ×6 IMPLANT
BLADE CLIPPER SURG (BLADE) ×8 IMPLANT
BLADE STERNUM SYSTEM 6 (BLADE) ×6 IMPLANT
BNDG ELASTIC 4X5.8 VLCR STR LF (GAUZE/BANDAGES/DRESSINGS) ×6 IMPLANT
BNDG ELASTIC 6X5.8 VLCR STR LF (GAUZE/BANDAGES/DRESSINGS) ×6 IMPLANT
BNDG GAUZE ELAST 4 BULKY (GAUZE/BANDAGES/DRESSINGS) ×6 IMPLANT
CANISTER SUCT 3000ML PPV (MISCELLANEOUS) ×6 IMPLANT
CATH CPB KIT HENDRICKSON (MISCELLANEOUS) ×6 IMPLANT
CATH ROBINSON RED A/P 18FR (CATHETERS) ×12 IMPLANT
CLIP RETRACTION 3.0MM CORONARY (MISCELLANEOUS) ×4 IMPLANT
CLIP VESOCCLUDE SM WIDE 24/CT (CLIP) ×12 IMPLANT
DERMABOND ADVANCED (GAUZE/BANDAGES/DRESSINGS) ×2
DERMABOND ADVANCED .7 DNX12 (GAUZE/BANDAGES/DRESSINGS) ×4 IMPLANT
DRAIN CHANNEL 28F RND 3/8 FF (WOUND CARE) ×18 IMPLANT
DRAPE CARDIOVASCULAR INCISE (DRAPES) ×6
DRAPE SLUSH/WARMER DISC (DRAPES) ×6 IMPLANT
DRAPE SRG 135X102X78XABS (DRAPES) ×4 IMPLANT
DRSG AQUACEL AG ADV 3.5X14 (GAUZE/BANDAGES/DRESSINGS) ×6 IMPLANT
DRSG XEROFORM 1X8 (GAUZE/BANDAGES/DRESSINGS) ×2 IMPLANT
ELECT CAUTERY BLADE 6.4 (BLADE) ×6 IMPLANT
ELECT REM PT RETURN 9FT ADLT (ELECTROSURGICAL) ×12
ELECTRODE REM PT RTRN 9FT ADLT (ELECTROSURGICAL) ×8 IMPLANT
FELT TEFLON 1X6 (MISCELLANEOUS) ×10 IMPLANT
GAUZE SPONGE 4X4 12PLY STRL (GAUZE/BANDAGES/DRESSINGS) ×12 IMPLANT
GLOVE NEODERM STRL 7.5  LF PF (GLOVE) ×12
GLOVE NEODERM STRL 7.5 LF PF (GLOVE) ×12 IMPLANT
GLOVE SURG NEODERM 7.5  LF PF (GLOVE) ×6
GOWN STRL REUS W/ TWL LRG LVL3 (GOWN DISPOSABLE) ×16 IMPLANT
GOWN STRL REUS W/TWL LRG LVL3 (GOWN DISPOSABLE) ×36
HEMOSTAT POWDER SURGIFOAM 1G (HEMOSTASIS) ×8 IMPLANT
INSERT FOGARTY XLG (MISCELLANEOUS) ×6 IMPLANT
INSERT SUTURE HOLDER (MISCELLANEOUS) ×6 IMPLANT
KIT BASIN OR (CUSTOM PROCEDURE TRAY) ×6 IMPLANT
KIT SUCTION CATH 14FR (SUCTIONS) ×6 IMPLANT
KIT TURNOVER KIT B (KITS) ×6 IMPLANT
KIT VASOVIEW HEMOPRO 2 VH 4000 (KITS) ×6 IMPLANT
MARKER GRAFT CORONARY BYPASS (MISCELLANEOUS) ×12 IMPLANT
NDL 18GX1X1/2 (RX/OR ONLY) (NEEDLE) ×4 IMPLANT
NEEDLE 18GX1X1/2 (RX/OR ONLY) (NEEDLE) ×6 IMPLANT
NS IRRIG 1000ML POUR BTL (IV SOLUTION) ×30 IMPLANT
PACK E OPEN HEART (SUTURE) ×6 IMPLANT
PACK OPEN HEART (CUSTOM PROCEDURE TRAY) ×6 IMPLANT
PACK SPY-PHI (KITS) ×2 IMPLANT
PAD ARMBOARD 7.5X6 YLW CONV (MISCELLANEOUS) ×12 IMPLANT
PAD ELECT DEFIB RADIOL ZOLL (MISCELLANEOUS) ×6 IMPLANT
PENCIL BUTTON HOLSTER BLD 10FT (ELECTRODE) ×6 IMPLANT
POSITIONER HEAD DONUT 9IN (MISCELLANEOUS) ×6 IMPLANT
POWDER SURGICEL 3.0 GRAM (HEMOSTASIS) ×6 IMPLANT
PUNCH AORTIC ROTATE 4.5MM 8IN (MISCELLANEOUS) ×2 IMPLANT
SEALANT SURG COSEAL 4ML (VASCULAR PRODUCTS) ×6 IMPLANT
SEALANT SURG COSEAL 8ML (VASCULAR PRODUCTS) IMPLANT
SUT BONE WAX W31G (SUTURE) ×6 IMPLANT
SUT MNCRL AB 3-0 PS2 18 (SUTURE) ×12 IMPLANT
SUT PDS AB 1 CTX 36 (SUTURE) ×12 IMPLANT
SUT PROLENE 3 0 SH DA (SUTURE) ×6 IMPLANT
SUT PROLENE 5 0 C 1 36 (SUTURE) IMPLANT
SUT PROLENE 6 0 C 1 30 (SUTURE) ×20 IMPLANT
SUT PROLENE 8 0 BV175 6 (SUTURE) IMPLANT
SUT PROLENE BLUE 7 0 (SUTURE) ×6 IMPLANT
SUT SILK  1 MH (SUTURE) ×6
SUT SILK 1 MH (SUTURE) IMPLANT
SUT SILK 2 0 SH CR/8 (SUTURE) IMPLANT
SUT SILK 3 0 SH CR/8 (SUTURE) IMPLANT
SUT STEEL 6MS V (SUTURE) ×6 IMPLANT
SUT STEEL SZ 6 DBL 3X14 BALL (SUTURE) ×6 IMPLANT
SUT TEM PAC WIRE 2 0 SH (SUTURE) ×4 IMPLANT
SUT VIC AB 1 CTX 36 (SUTURE) ×6
SUT VIC AB 1 CTX36XBRD ANBCTR (SUTURE) IMPLANT
SUT VIC AB 2-0 CTX 27 (SUTURE) IMPLANT
SUT VIC AB 3-0 X1 27 (SUTURE) IMPLANT
SYR 10ML LL (SYRINGE) IMPLANT
SYR 30ML LL (SYRINGE) ×6 IMPLANT
SYR 3ML LL SCALE MARK (SYRINGE) ×6 IMPLANT
SYSTEM SAHARA CHEST DRAIN ATS (WOUND CARE) ×6 IMPLANT
TAPE CLOTH SURG 4X10 WHT LF (GAUZE/BANDAGES/DRESSINGS) ×2 IMPLANT
TAPE PAPER 2X10 WHT MICROPORE (GAUZE/BANDAGES/DRESSINGS) ×2 IMPLANT
TOWEL GREEN STERILE (TOWEL DISPOSABLE) ×6 IMPLANT
TOWEL GREEN STERILE FF (TOWEL DISPOSABLE) ×6 IMPLANT
TRAY FOLEY SLVR 16FR TEMP STAT (SET/KITS/TRAYS/PACK) ×6 IMPLANT
TUBING LAP HI FLOW INSUFFLATIO (TUBING) ×6 IMPLANT
UNDERPAD 30X36 HEAVY ABSORB (UNDERPADS AND DIAPERS) ×6 IMPLANT
WATER STERILE IRR 1000ML POUR (IV SOLUTION) ×12 IMPLANT
WATER STERILE IRR 1000ML UROMA (IV SOLUTION) IMPLANT

## 2020-11-26 NOTE — Progress Notes (Signed)
Dr. Roxan Hockey aware of repeat ABG results. No new orders.    Results for Craig Taylor, Craig Taylor (MRN [id number]) as of 11/26/2020 18:40  Ref. Range 11/26/2020 18:06  Sample type Unknown ARTERIAL  pH, Arterial Latest Ref Range: 7.350 - 7.450  7.295 (L)  pCO2 arterial Latest Ref Range: 32.0 - 48.0 mmHg 45.8  pO2, Arterial Latest Ref Range: 83.0 - 108.0 mmHg 84  TCO2 Latest Ref Range: 22 - 32 mmol/L 24  Acid-base deficit Latest Ref Range: 0.0 - 2.0 mmol/L 4.0 (H)  Bicarbonate Latest Ref Range: 20.0 - 28.0 mmol/L 22.2  O2 Saturation Latest Units: % 95.0  Patient temperature Unknown 37.5 C  Sodium Latest Ref Range: 135 - 145 mmol/L 142  Potassium Latest Ref Range: 3.5 - 5.1 mmol/L 3.9  Calcium Ionized Latest Ref Range: 1.15 - 1.40 mmol/L 1.21  Hemoglobin Latest Ref Range: 13.0 - 17.0 g/dL 9.9 (L)  HCT Latest Ref Range: 39.0 - 52.0 % 29.0 (L)

## 2020-11-26 NOTE — Progress Notes (Signed)
      [address]       [place] [address]             [phone]      S/p CABG x 3  Extubated  BP 107/65   Pulse 69   Temp 99.5 F (37.5 C)   Resp 13   Ht 6\' 6"  (1.981 m)   Wt 105.7 kg   SpO2 96%   BMI 26.93 kg/m  4L [place] 97% sat 19/9 Ci= 2.5  Intake/Output Summary (Last 24 hours) at 11/26/2020 1832 Last data filed at 11/26/2020 1800 Gross per 24 hour  Intake 4537.61 ml  Output 2157 ml  Net 2380.61 ml   Minimal CT output K= 3.9, Hct= 29  Doing well early postop  Remo Lipps C. Roxan Hockey, MD [facility] [phone]

## 2020-11-26 NOTE — Transfer of Care (Signed)
Immediate Anesthesia Transfer of Care Note  Patient: Craig Taylor  Procedure(s) Performed: CORONARY ARTERY BYPASS GRAFTING (CABG) TIMES 3 ON PUMP USING LEFT INTERNAL MAMMARY ARTERY AND ENDOSCOPICALLY HARVESTED RIGHT GREATER SAPHENOUS VEIN (N/A Chest) TRANSESOPHAGEAL ECHOCARDIOGRAM (TEE) (N/A ) ENDOVEIN HARVEST OF GREATER SAPHENOUS VEIN (Right ) INDOCYANINE GREEN FLUORESCENCE IMAGING (ICG) (Chest)  Patient Location: ICU  Anesthesia Type:General  Level of Consciousness: sedated and Patient remains intubated per anesthesia plan  Airway & Oxygen Therapy: Patient remains intubated per anesthesia plan and Patient placed on Ventilator (see vital sign flow sheet for setting)  Post-op Assessment: Report given to RN and Post -op Vital signs reviewed and stable  Post vital signs: Reviewed and stable  Last Vitals:  Vitals Value Taken Time  BP    Temp    Pulse    Resp    SpO2      Last Pain:  Vitals:   11/26/20 0415  TempSrc: Oral  PainSc: 0-No pain         Complications: No complications documented.

## 2020-11-26 NOTE — Anesthesia Procedure Notes (Addendum)
Procedure Name: Intubation Date/Time: 11/26/2020 7:54 AM Performed by: Lance Coon, CRNA Pre-anesthesia Checklist: Patient identified, Emergency Drugs available, Suction available and Patient being monitored Patient Re-evaluated:Patient Re-evaluated prior to induction Oxygen Delivery Method: Circle System Utilized Preoxygenation: Pre-oxygenation with 100% oxygen Induction Type: IV induction Ventilation: Mask ventilation without difficulty Laryngoscope Size: Mac and 4 Grade View: Grade II Tube type: Oral Tube size: 8.0 mm Number of attempts: 1 Airway Equipment and Method: Stylet Placement Confirmation: ETT inserted through vocal cords under direct vision,  positive ETCO2 and breath sounds checked- equal and bilateral Secured at: 24 cm Tube secured with: Tape Dental Injury: Teeth and Oropharynx as per pre-operative assessment

## 2020-11-26 NOTE — Progress Notes (Signed)
Post extubation ABG. Dr. Orvan Seen aware.    Results for Craig Taylor, Craig Taylor (MRN [id number]) as of 11/26/2020 17:07  Ref. Range 11/26/2020 16:58  Sample type Unknown ARTERIAL  pH, Arterial Latest Ref Range: 7.350 - 7.450  7.296 (L)  pCO2 arterial Latest Ref Range: 32.0 - 48.0 mmHg 48.2 (H)  pO2, Arterial Latest Ref Range: 83.0 - 108.0 mmHg 76 (L)  TCO2 Latest Ref Range: 22 - 32 mmol/L 25  Acid-base deficit Latest Ref Range: 0.0 - 2.0 mmol/L 3.0 (H)  Bicarbonate Latest Ref Range: 20.0 - 28.0 mmol/L 23.5  O2 Saturation Latest Units: % 93.0  Patient temperature Unknown 37.1 C  Sodium Latest Ref Range: 135 - 145 mmol/L 142  Potassium Latest Ref Range: 3.5 - 5.1 mmol/L 3.9  Calcium Ionized Latest Ref Range: 1.15 - 1.40 mmol/L 1.24  Hemoglobin Latest Ref Range: 13.0 - 17.0 g/dL 10.2 (L)  HCT Latest Ref Range: 39.0 - 52.0 % 30.0 (L)

## 2020-11-26 NOTE — Anesthesia Postprocedure Evaluation (Signed)
Anesthesia Post Note  Patient: Craig Taylor  Procedure(s) Performed: CORONARY ARTERY BYPASS GRAFTING (CABG) TIMES 3 ON PUMP USING LEFT INTERNAL MAMMARY ARTERY AND ENDOSCOPICALLY HARVESTED RIGHT GREATER SAPHENOUS VEIN (N/A Chest) TRANSESOPHAGEAL ECHOCARDIOGRAM (TEE) (N/A ) ENDOVEIN HARVEST OF GREATER SAPHENOUS VEIN (Right ) INDOCYANINE GREEN FLUORESCENCE IMAGING (ICG) (Chest)     Patient location during evaluation: SICU Anesthesia Type: General Level of consciousness: sedated Pain management: pain level controlled Vital Signs Assessment: post-procedure vital signs reviewed and stable Respiratory status: patient remains intubated per anesthesia plan Cardiovascular status: stable Postop Assessment: no apparent nausea or vomiting Anesthetic complications: no   No complications documented.  Last Vitals:  Vitals:   11/26/20 1715 11/26/20 1722  BP:    Pulse: 79   Resp: 12   Temp: 37.3 C   SpO2: 97% 97%    Last Pain:  Vitals:   11/26/20 1700  TempSrc:   PainSc: [place]

## 2020-11-26 NOTE — Plan of Care (Signed)

## 2020-11-26 NOTE — Op Note (Signed)
CARDIOTHORACIC SURGERY OPERATIVE NOTE  Date of Procedure: 11/26/2020  Preoperative Diagnosis: Severe 3-vessel Coronary Artery Disease, unstable angina  Postoperative Diagnosis: Same  Procedure:    Coronary Artery Bypass Grafting x 3  Left Internal Mammary Artery to Distal Left Anterior Descending Coronary Artery, Saphenous Vein Graft to  Posterior Descending Coronary Artery, Saphenous Vein Graft to Obtuse Marginal Branch of Left Circumflex Coronary Artery, Endoscopic Vein Harvest from right thigh and Lower Leg  Surgeon: B.  Murvin Natal , MD  Assistant: Evonnie Pat, PA-C   Anesthesia: General  Operative Findings:  Preserved left ventricular systolic function  Good quality left internal mammary artery conduit  Good quality saphenous vein conduit  Good quality target vessels for grafting    BRIEF CLINICAL NOTE AND INDICATIONS FOR SURGERY  80 year old man presented with progressive angina.  He had a history of coronary disease status post PCI several years ago.  Repeat evaluation of his coronary anatomy demonstrated severe multivessel disease.  He is referred for CABG.  He has been thoroughly evaluated and is considered be a good candidate for the procedure   DETAILS OF THE OPERATIVE PROCEDURE  Preparation:  The patient is brought to the operating room on the above mentioned date and central monitoring was established by the anesthesia team including placement of Swan-Ganz catheter and radial arterial line. The patient is placed in the supine position on the operating table.  Intravenous antibiotics are administered. General endotracheal anesthesia is induced uneventfully. A Foley catheter is placed.  Baseline transesophageal echocardiogram was performed.  Findings were notable for preserved LV function and no significant valvular disorders  The patient's chest, abdomen, both groins, and both lower extremities are prepared and draped in a sterile manner. A time out procedure is  performed.   Surgical Approach and Conduit Harvest:  A median sternotomy incision was performed and the left internal mammary artery is dissected from the chest wall and prepared for bypass grafting. The left internal mammary artery is notably good quality conduit. Simultaneously, the greater saphenous vein is obtained from the patient's right thigh using endoscopic vein harvest technique. The saphenous vein is notably good quality conduit. After removal of the saphenous vein, the small surgical incisions in the lower extremity are closed with absorbable suture. Following systemic heparinization, the left internal mammary artery was transected distally noted to have excellent flow.   Extracorporeal Cardiopulmonary Bypass and Myocardial Protection:  The pericardium is opened. The ascending aorta is normal in appearance. The ascending aorta and the right atrium are cannulated for cardiopulmonary bypass.  Adequate heparinization is verified.   The entire pre-bypass portion of the operation was notable for stable hemodynamics.  Cardiopulmonary bypass was begun and the surface of the heart is inspected. Distal target vessels are selected for coronary artery bypass grafting. A cardioplegia cannula is placed in the ascending aorta.   The patient is allowed to cool passively to 34C systemic temperature.  The aortic cross clamp is applied and cold blood cardioplegia is delivered initially in an antegrade fashion through the aortic root.  Iced saline slush is applied for topical hypothermia.  The initial cardioplegic arrest is rapid with early diastolic arrest.  Repeat doses of cardioplegia are administered intermittently throughout the entire cross clamp portion of the operation through the aortic root and through subsequently placed vein grafts in order to maintain completely flat electrocardiogram.  Coronary Artery Bypass Grafting:   The  posterior descending branch of the right coronary artery was  grafted using a reversed saphenous vein  graft in an end-to-side fashion.  At the site of distal anastomosis the target vessel was good quality and measured approximately 1.5 mm in diameter.  The  obtuse marginal branch of the left circumflex coronary artery was grafted using a reversed saphenous vein graft in an end-to-side fashion.  At the site of distal anastomosis the target vessel was good quality and measured approximately 1.5 mm in diameter.  The distal left anterior coronary artery was grafted with the left internal mammary artery in an end-to-side fashion.  At the site of distal anastomosis the target vessel was good quality and measured approximately 1.5 mm in diameter. Anastomotic patency and runoff was confirmed with indocyanine green fluorescence imaging (SPY).  All proximal vein graft anastomoses were placed directly to the ascending aorta prior to removal of the aortic cross clamp.  A reanimation dose of cardioplegia was given down the aortic root and aortic cross-clamp was removed after de-airing procedures were performed   Procedure Completion:  All proximal and distal coronary anastomoses were inspected for hemostasis and appropriate graft orientation. Epicardial pacing wires are fixed to the right ventricular outflow tract and to the right atrial appendage. The patient is rewarmed to 37C temperature. The patient is weaned and disconnected from cardiopulmonary bypass.  The patient's rhythm at separation from bypass was sinus bradycardia.  The patient was weaned from cardiopulmonary bypass  without any inotropic support..  Followup transesophageal echocardiogram performed after separation from bypass revealed  no changes from the preoperative exam.  The aortic and venous cannula were removed uneventfully. Protamine was administered to reverse the anticoagulation. The mediastinum and pleural space were inspected for hemostasis and irrigated with saline solution. The mediastinum and  both pleural spaces were drained using fluted chest tubes placed through separate stab incisions inferiorly.  The soft tissues anterior to the aorta were reapproximated loosely. The sternum is closed with double strength sternal wire. The soft tissues anterior to the sternum were closed in multiple layers and the skin is closed with a running subcuticular skin closure.  The post-bypass portion of the operation was notable for stable rhythm and hemodynamics.  No blood products were administered during the operation.   Disposition:  The patient tolerated the procedure well and is transported to the surgical intensive care in stable condition. There are no intraoperative complications. All sponge instrument and needle counts are verified correct at completion of the operation.    Jayme Cloud, MD 11/26/2020 11:43 AM

## 2020-11-26 NOTE — Brief Op Note (Signed)
11/21/2020 - 11/26/2020  10:45 AM  PATIENT:  Craig Taylor  80 y.o. male  PRE-OPERATIVE DIAGNOSIS:  Coronary Artery Disease, UNSTABLE ANGINA  POST-OPERATIVE DIAGNOSIS:  Coronary Artery Disease, UNSTABLE ANGINA  PROCEDURE:  Procedure(s): CORONARY ARTERY BYPASS GRAFTING (CABG) TIMES 3 ON PUMP USING LEFT INTERNAL MAMMARY ARTERY AND ENDOSCOPICALLY HARVESTED RIGHT GREATER SAPHENOUS VEIN (N/A) TRANSESOPHAGEAL ECHOCARDIOGRAM (TEE) (N/A) ENDOVEIN HARVEST OF GREATER SAPHENOUS VEIN (Right) INDOCYANINE GREEN FLUORESCENCE IMAGING (ICG) [facility] SG-OM SCG-PD  EVH 60 MIN  SURGEON:  Surgeon(s) and Role:    * Zerick Prevette, Glenice Bow, MD - Primary  PHYSICIAN ASSISTANT: WAYNE GOLD PA-C  ASSISTANTS: STAFF   ANESTHESIA:   general    BLOOD ADMINISTERED:none  DRAINS: LEFT PLEURAL AND MEDIASTINAL CHEST TUBES   LOCAL MEDICATIONS USED:  NONE  SPECIMEN:  No Specimen  DISPOSITION OF SPECIMEN:  N/A  COUNTS:  YES  TOURNIQUET:  * No tourniquets in log *  DICTATION: .Dragon Dictation  PLAN OF CARE: Admit to inpatient   PATIENT DISPOSITION:  ICU - intubated and hemodynamically stable.   Delay start of Pharmacological VTE agent (>24hrs) due to surgical blood loss or risk of bleeding: yes  COMPLICATIONS: NO KNOWN

## 2020-11-26 NOTE — Anesthesia Procedure Notes (Signed)
Central Venous Catheter Insertion Performed by: Catalina Gravel, MD, anesthesiologist Start/End4/18/2022 6:55 AM, 11/26/2020 7:00 AM Patient location: Pre-op. Preanesthetic checklist: patient identified, IV checked, site marked, risks and benefits discussed, surgical consent, monitors and equipment checked, pre-op evaluation and timeout performed Position: Trendelenburg Hand hygiene performed  and maximum sterile barriers used  Total catheter length 100. PA cath was placed.Swan type:thermodilution PA Cath depth:48 Procedure performed without using ultrasound guided technique. Attempts: 1 Patient tolerated the procedure well with no immediate complications.

## 2020-11-26 NOTE — Progress Notes (Signed)
Vent adjustments made per Dr. Orvan Seen. 2 amps bicarb administered.    Results for Craig Taylor, Craig Taylor (MRN [id number]) as of 11/26/2020 17:07  Ref. Range 11/26/2020 12:17  Sample type Unknown ARTERIAL  pH, Arterial Latest Ref Range: 7.350 - 7.450  7.324 (L)  pCO2 arterial Latest Ref Range: 32.0 - 48.0 mmHg 38.8  pO2, Arterial Latest Ref Range: 83.0 - 108.0 mmHg 66 (L)  TCO2 Latest Ref Range: 22 - 32 mmol/L 22  Acid-base deficit Latest Ref Range: 0.0 - 2.0 mmol/L 5.0 (H)  Bicarbonate Latest Ref Range: 20.0 - 28.0 mmol/L 20.5  O2 Saturation Latest Units: % 92.0  Patient temperature Unknown 35.8 C  Sodium Latest Ref Range: 135 - 145 mmol/L 140  Potassium Latest Ref Range: 3.5 - 5.1 mmol/L 4.5  Calcium Ionized Latest Ref Range: 1.15 - 1.40 mmol/L 1.21  Hemoglobin Latest Ref Range: 13.0 - 17.0 g/dL 10.2 (L)  HCT Latest Ref Range: 39.0 - 52.0 % 30.0 (L)

## 2020-11-26 NOTE — Anesthesia Procedure Notes (Signed)
Arterial Line Insertion Start/End4/18/2022 6:45 AM Performed by: Elenora Gamma, RN  Preanesthetic checklist: patient identified, IV checked, site marked, risks and benefits discussed, surgical consent, monitors and equipment checked, pre-op evaluation, timeout performed and anesthesia consent Lidocaine 1% used for infiltration and patient sedated Left, radial was placed Catheter size: 20 G Hand hygiene performed  and maximum sterile barriers used  Allen's test indicative of satisfactory collateral circulation Attempts: 1 Procedure performed without using ultrasound guided technique. Following insertion, dressing applied and Biopatch. Post procedure assessment: normal  Patient tolerated the procedure well with no immediate complications.

## 2020-11-26 NOTE — Procedures (Signed)
Extubation Procedure Note  Patient Details:   Name: Craig Taylor DOB: September 20, 1940 MRN: [id number]   Airway Documentation:    Vent end date: 11/26/20 Vent end time: 1600   Evaluation  O2 sats: stable throughout Complications: No apparent complications Patient did tolerate procedure well. Bilateral Breath Sounds: Clear,Diminished   Yes  4Lmin [place] Incentive spirometer NIF-26 FVC 974ml   Revonda Standard 11/26/2020, 4:04 PM

## 2020-11-26 NOTE — H&P (Signed)
History and Physical Interval Note:  11/26/2020 7:34 AM  Craig Taylor  has presented today for surgery, with the diagnosis of CAD, UNSTABLE ANGINA.  The various methods of treatment have been discussed with the patient and family. After consideration of risks, benefits and other options for treatment, the patient has consented to  Procedure(s): CORONARY ARTERY BYPASS GRAFTING (CABG) (N/A) TRANSESOPHAGEAL ECHOCARDIOGRAM (TEE) AND ENDO VASCULAR VEIN HARVEST (N/A) as a surgical intervention.  The patient's history has been reviewed, patient examined, no change in status, stable for surgery.  I have reviewed the patient's chart and labs.  Questions were answered to the patient's satisfaction.     Wonda Olds

## 2020-11-26 NOTE — Anesthesia Procedure Notes (Signed)
Central Venous Catheter Insertion Performed by: Catalina Gravel, MD, anesthesiologist Start/End4/18/2022 6:45 AM, 11/26/2020 6:55 AM Patient location: Pre-op. Preanesthetic checklist: patient identified, IV checked, site marked, risks and benefits discussed, surgical consent, monitors and equipment checked, pre-op evaluation, timeout performed and anesthesia consent Position: Trendelenburg Lidocaine 1% used for infiltration and patient sedated Hand hygiene performed , maximum sterile barriers used  and Seldinger technique used Catheter size: 8.5 Fr Central line was placed.Sheath introducer Procedure performed using ultrasound guided technique. Ultrasound Notes:anatomy identified, needle tip was noted to be adjacent to the nerve/plexus identified, no ultrasound evidence of intravascular and/or intraneural injection and image(s) printed for medical record Attempts: 1 Following insertion, line sutured, dressing applied and Biopatch. Post procedure assessment: free fluid flow, blood return through all ports and no air  Patient tolerated the procedure well with no immediate complications.

## 2020-11-27 ENCOUNTER — Encounter (HOSPITAL_COMMUNITY): Payer: Self-pay | Admitting: Cardiothoracic Surgery

## 2020-11-27 ENCOUNTER — Inpatient Hospital Stay (HOSPITAL_COMMUNITY): Payer: Medicare PPO

## 2020-11-27 LAB — BASIC METABOLIC PANEL
Anion gap: 6 (ref 5–15)
Anion gap: 5 (ref 5–15)
BUN: 18 mg/dL (ref 8–23)
BUN: 24 mg/dL — ABNORMAL HIGH (ref 8–23)
CO2: 24 mmol/L (ref 22–32)
CO2: 26 mmol/L (ref 22–32)
Calcium: 8.3 mg/dL — ABNORMAL LOW (ref 8.9–10.3)
Calcium: 8.5 mg/dL — ABNORMAL LOW (ref 8.9–10.3)
Chloride: 109 mmol/L (ref 98–111)
Chloride: 109 mmol/L (ref 98–111)
Creatinine, Ser: 1.1 mg/dL (ref 0.61–1.24)
Creatinine, Ser: 1.46 mg/dL — ABNORMAL HIGH (ref 0.61–1.24)
GFR, Estimated: >60 mL/min (ref >60)
GFR, Estimated: 49 mL/min — ABNORMAL LOW (ref >60)
Glucose, Bld: 123 mg/dL — ABNORMAL HIGH (ref 70–99)
Glucose, Bld: 143 mg/dL — ABNORMAL HIGH (ref 70–99)
Potassium: 4 mmol/L (ref 3.5–5.1)
Potassium: 4.2 mmol/L (ref 3.5–5.1)
Sodium: 139 mmol/L (ref 135–145)
Sodium: 140 mmol/L (ref 135–145)

## 2020-11-27 LAB — GLUCOSE, CAPILLARY
Glucose-Capillary: 128 mg/dL — ABNORMAL HIGH (ref 70–99)
Glucose-Capillary: 118 mg/dL — ABNORMAL HIGH (ref 70–99)
Glucose-Capillary: 140 mg/dL — ABNORMAL HIGH (ref 70–99)
Glucose-Capillary: 125 mg/dL — ABNORMAL HIGH (ref 70–99)
Glucose-Capillary: 136 mg/dL — ABNORMAL HIGH (ref 70–99)
Glucose-Capillary: 141 mg/dL — ABNORMAL HIGH (ref 70–99)

## 2020-11-27 LAB — CBC
HCT: 28.3 % — ABNORMAL LOW (ref 39.0–52.0)
HCT: 29.5 % — ABNORMAL LOW (ref 39.0–52.0)
Hemoglobin: 9.2 g/dL — ABNORMAL LOW (ref 13.0–17.0)
Hemoglobin: 9.3 g/dL — ABNORMAL LOW (ref 13.0–17.0)
MCH: 28.1 pg (ref 26.0–34.0)
MCH: 28.3 pg (ref 26.0–34.0)
MCHC: 32.5 g/dL (ref 30.0–36.0)
MCHC: 31.5 g/dL (ref 30.0–36.0)
MCV: 86.5 fL (ref 80.0–100.0)
MCV: 89.7 fL (ref 80.0–100.0)
Platelets: 154 10*3/uL (ref 150–400)
Platelets: 164 10*3/uL (ref 150–400)
RBC: 3.27 MIL/uL — ABNORMAL LOW (ref 4.22–5.81)
RBC: 3.29 MIL/uL — ABNORMAL LOW (ref 4.22–5.81)
RDW: 13.6 % (ref 11.5–15.5)
RDW: 13.9 % (ref 11.5–15.5)
WBC: 8.2 10*3/uL (ref 4.0–10.5)
WBC: 9.6 10*3/uL (ref 4.0–10.5)
nRBC: 0 % (ref 0.0–0.2)
nRBC: 0 % (ref 0.0–0.2)

## 2020-11-27 LAB — MAGNESIUM
Magnesium: 2.3 mg/dL (ref 1.7–2.4)
Magnesium: 2.2 mg/dL (ref 1.7–2.4)

## 2020-11-27 MED ORDER — THIAMINE HCL 100 MG/ML IJ SOLN
Freq: Once | INTRAVENOUS | Status: AC
  Filled 2020-11-27: qty 1000

## 2020-11-27 MED ORDER — INSULIN ASPART 100 UNIT/ML ~~LOC~~ SOLN
0.0000 [IU] | SUBCUTANEOUS | Status: DC
Start: 2020-11-27 — End: 2020-11-30
  Administered 2020-11-27 – 2020-11-29 (×7): 2 [IU] via SUBCUTANEOUS
  Administered 2020-11-29: 4 [IU] via SUBCUTANEOUS
  Administered 2020-11-29 (×2): 2 [IU] via SUBCUTANEOUS
  Administered 2020-11-30: 4 [IU] via SUBCUTANEOUS

## 2020-11-27 MED ORDER — KETOROLAC TROMETHAMINE 15 MG/ML IJ SOLN
7.5000 mg | Freq: Four times a day (QID) | INTRAMUSCULAR | Status: AC
  Administered 2020-11-27 – 2020-11-28 (×5): 7.5 mg via INTRAVENOUS
  Filled 2020-11-27 (×5): qty 1

## 2020-11-27 MED ORDER — DIAZEPAM 2 MG PO TABS
2.0000 mg | ORAL_TABLET | Freq: Three times a day (TID) | ORAL | Status: DC
  Administered 2020-11-27 – 2020-11-28 (×4): 2 mg via ORAL
  Filled 2020-11-27 (×4): qty 1

## 2020-11-27 MED ORDER — COLCHICINE 0.3 MG HALF TABLET
0.3000 mg | ORAL_TABLET | Freq: Two times a day (BID) | ORAL | Status: DC
  Administered 2020-11-27 – 2020-12-01 (×9): 0.3 mg via ORAL
  Filled 2020-11-27 (×11): qty 1

## 2020-11-27 MED ORDER — FENTANYL CITRATE (PF) 100 MCG/2ML IJ SOLN: 25.0000 ug | INTRAMUSCULAR | Status: DC | PRN

## 2020-11-27 MED ORDER — LEVALBUTEROL HCL 0.63 MG/3ML IN NEBU: 0.6300 mg | INHALATION_SOLUTION | Freq: Four times a day (QID) | RESPIRATORY_TRACT | Status: DC | PRN

## 2020-11-27 MED ORDER — ORAL CARE MOUTH RINSE
15.0000 mL | Freq: Two times a day (BID) | OROMUCOSAL | Status: DC
  Administered 2020-11-27 – 2020-12-01 (×6): 15 mL via OROMUCOSAL

## 2020-11-27 NOTE — Progress Notes (Signed)
1 Day Post-Op Procedure(s) (LRB): CORONARY ARTERY BYPASS GRAFTING (CABG) TIMES 3 ON PUMP USING LEFT INTERNAL MAMMARY ARTERY AND ENDOSCOPICALLY HARVESTED RIGHT GREATER SAPHENOUS VEIN (N/A) TRANSESOPHAGEAL ECHOCARDIOGRAM (TEE) (N/A) ENDOVEIN HARVEST OF GREATER SAPHENOUS VEIN (Right) INDOCYANINE GREEN FLUORESCENCE IMAGING (ICG) Subjective: Mild pain  Objective: Vital signs in last 24 hours: Temp:  [96.26 F (35.7 C)-100.04 F (37.8 C)] 99.14 F (37.3 C) (04/19 0700) Pulse Rate:  [50-93] 68 (04/19 0700) Cardiac Rhythm: A-V Sequential paced (04/19 0400) Resp:  [11-23] 20 (04/19 0700) BP: (107-146)/(46-92) 125/58 (04/19 0700) SpO2:  [93 %-100 %] 99 % (04/19 0700) Arterial Line BP: (100-175)/(38-80) 154/43 (04/19 0700) FiO2 (%):  [40 %-70 %] 40 % (04/18 1525) Weight:  [111.7 kg] 111.7 kg (04/19 0300)  Hemodynamic parameters for last 24 hours: PAP: (17-28)/(5-16) 24/8 CO:  [4.6 L/min-7.1 L/min] 5.6 L/min CI:  [1.9 L/min/m2-3 L/min/m2] 2.3 L/min/m2  Intake/Output from previous day: 04/18 0701 - 04/19 0700 In: 6160 [I.V.:4119.8; Blood:340; IV Piggyback:1700.2] Out: 3025 [Urine:1655; Blood:650; Chest Tube:720] Intake/Output this shift: No intake/output data recorded.  General appearance: alert and cooperative Neurologic: intact Heart: regular rate and rhythm, S1, S2 normal, no murmur, click, rub or gallop Lungs: clear to auscultation bilaterally Abdomen: soft, non-tender; bowel sounds normal; no masses,  no organomegaly Extremities: extremities normal, atraumatic, no cyanosis or edema Wound: dressed, dry  Lab Results: Recent Labs    11/26/20 1805 11/26/20 1806 11/27/20 0311  WBC 9.6  --  8.2  HGB 10.0* 9.9* 9.2*  HCT 31.3* 29.0* 28.3*  PLT 170  --  154   BMET:  Recent Labs    11/26/20 1805 11/26/20 1806 11/27/20 0311  NA 140 142 139  K 3.8 3.9 4.0  CL 111  --  109  CO2 23  --  24  GLUCOSE 116*  --  123*  BUN 16  --  18  CREATININE 1.04  --  1.10  CALCIUM 8.2*   --  8.3*    PT/INR:  Recent Labs    11/26/20 1217  LABPROT 15.5*  INR 1.2   ABG    Component Value Date/Time   PHART 7.295 (L) 11/26/2020 1806   HCO3 22.2 11/26/2020 1806   TCO2 24 11/26/2020 1806   ACIDBASEDEF 4.0 (H) 11/26/2020 1806   O2SAT 95.0 11/26/2020 1806   CBG (last 3)  Recent Labs    11/27/20 0315 11/27/20 0518 11/27/20 0643  GLUCAP 118* 140* 125*    Assessment/Plan: S/P Procedure(s) (LRB): CORONARY ARTERY BYPASS GRAFTING (CABG) TIMES 3 ON PUMP USING LEFT INTERNAL MAMMARY ARTERY AND ENDOSCOPICALLY HARVESTED RIGHT GREATER SAPHENOUS VEIN (N/A) TRANSESOPHAGEAL ECHOCARDIOGRAM (TEE) (N/A) ENDOVEIN HARVEST OF GREATER SAPHENOUS VEIN (Right) INDOCYANINE GREEN FLUORESCENCE IMAGING (ICG) Mobilize remove PA cath  Remove a-line oob to chair Pain control Gentle resuscitation   LOS: 5 days    Wonda Olds 11/27/2020

## 2020-11-27 NOTE — Plan of Care (Signed)

## 2020-11-27 NOTE — Hospital Course (Addendum)
  History of Present Illness:       50 with man with h/o CAD s/p PCI 20 years ago and PMHx otherwise notable for bladder CA, HTN, HLD, DM presented with exertional angina worsening over past 2 weeks. Had mild SOB/DOE as well. Pain described in upper chest without radiation. Sought evaluation in ED where Troponins negative, but hx concerning for [place] and underwent LHC, which shows severe multivessel CAD. Referral received for CABG. Has been intermittently bradycardiac in 50s, asx. Admitted awaiting CABG; has been CP free as long as he remains at rest.  Hospital course: The patient was medically stabilized on aspirin, heparin and statin.  He was also continued on a beta-blocker.  He was seen in cardiothoracic surgical consultation by Dr. Julien Girt who evaluated the patient and all relevant studies and recommended proceeding with CABG.  On 11/26/2020 he was taken to the operating room where he underwent CABG x3.  He tolerated the procedure well and was taken to the surgical intensive care unit in stable condition.  Postoperative hospital course:  The patient is doing well.  He was weaned from the ventilator using standard post cardiac surgical protocols.  Swan-Ganz and arterial line were removed on postoperative day #1.  He was noted to have a mild expected acute blood loss anemia which is being monitored clinically.  Renal function has remained within normal limits.  Pain is under good control.  He has been started on colchicine for anti-inflammatory effect.  All routine lines, monitors and drainage devices have been discontinued in the standard fashion.  He has developed postoperative atrial fibrillation and subsequently has been started on amiodarone.  Additionally he was started on apixaban.  He does have an expected acute blood loss anemia and values have stabilized.  Most recent hemoglobin and hematocrit dated 11/29/2020 are 9.2/29.1 respectively.  He does have a postoperative expected volume overload but has  responding well to diuretics.  They have subsequently been stopped on postoperative day #4.  We will check a BUN on the morning of postop day #5 to reevaluate stability of renal function.

## 2020-11-27 NOTE — Progress Notes (Signed)
Patient OOB to chair with 2x RN assist. Patient tolerates movement well with no dizziness; however, patient continues to have persistent nausea and vomiting with mobility and movement. PRN antiemetics dosed per MD order. Patient was able to complete 190 feet of hallway ambulation via four-wheel walker. Vitals stable. Marginal UOP noted. CT output remains minimal with mobility.

## 2020-11-28 ENCOUNTER — Inpatient Hospital Stay (HOSPITAL_COMMUNITY): Payer: Medicare PPO

## 2020-11-28 LAB — CBC
HCT: 28.1 % — ABNORMAL LOW (ref 39.0–52.0)
Hemoglobin: 9 g/dL — ABNORMAL LOW (ref 13.0–17.0)
MCH: 28.6 pg (ref 26.0–34.0)
MCHC: 32 g/dL (ref 30.0–36.0)
MCV: 89.2 fL (ref 80.0–100.0)
Platelets: 140 10*3/uL — ABNORMAL LOW (ref 150–400)
RBC: 3.15 MIL/uL — ABNORMAL LOW (ref 4.22–5.81)
RDW: 13.8 % (ref 11.5–15.5)
WBC: 7.8 10*3/uL (ref 4.0–10.5)
nRBC: 0 % (ref 0.0–0.2)

## 2020-11-28 LAB — BASIC METABOLIC PANEL
Anion gap: 5 (ref 5–15)
BUN: 27 mg/dL — ABNORMAL HIGH (ref 8–23)
CO2: 26 mmol/L (ref 22–32)
Calcium: 8.6 mg/dL — ABNORMAL LOW (ref 8.9–10.3)
Chloride: 106 mmol/L (ref 98–111)
Creatinine, Ser: 1.45 mg/dL — ABNORMAL HIGH (ref 0.61–1.24)
GFR, Estimated: 49 mL/min — ABNORMAL LOW (ref >60)
Glucose, Bld: 118 mg/dL — ABNORMAL HIGH (ref 70–99)
Potassium: 3.7 mmol/L (ref 3.5–5.1)
Sodium: 137 mmol/L (ref 135–145)

## 2020-11-28 LAB — GLUCOSE, CAPILLARY
Glucose-Capillary: 102 mg/dL — ABNORMAL HIGH (ref 70–99)
Glucose-Capillary: 115 mg/dL — ABNORMAL HIGH (ref 70–99)
Glucose-Capillary: 121 mg/dL — ABNORMAL HIGH (ref 70–99)
Glucose-Capillary: 130 mg/dL — ABNORMAL HIGH (ref 70–99)
Glucose-Capillary: 136 mg/dL — ABNORMAL HIGH (ref 70–99)
Glucose-Capillary: 151 mg/dL — ABNORMAL HIGH (ref 70–99)

## 2020-11-28 MED ORDER — PLASMA-LYTE A IV SOLN: INTRAVENOUS | Status: DC

## 2020-11-28 MED ORDER — CALCIUM CARBONATE ANTACID 500 MG PO CHEW
2.0000 | CHEWABLE_TABLET | Freq: Three times a day (TID) | ORAL | Status: DC | PRN
  Administered 2020-11-28 – 2020-11-29 (×3): 400 mg via ORAL
  Filled 2020-11-28 (×3): qty 2

## 2020-11-28 MED ORDER — POTASSIUM CHLORIDE CRYS ER 20 MEQ PO TBCR
20.0000 meq | EXTENDED_RELEASE_TABLET | ORAL | Status: AC
  Administered 2020-11-28 (×3): 20 meq via ORAL
  Filled 2020-11-28 (×2): qty 1

## 2020-11-28 MED ORDER — FUROSEMIDE 10 MG/ML IJ SOLN
40.0000 mg | Freq: Two times a day (BID) | INTRAMUSCULAR | Status: DC
  Administered 2020-11-28 – 2020-11-29 (×2): 40 mg via INTRAVENOUS
  Filled 2020-11-28 (×2): qty 4

## 2020-11-28 MED FILL — Magnesium Sulfate Inj 50%: INTRAMUSCULAR | Qty: 10 | Status: AC

## 2020-11-28 MED FILL — Sodium Bicarbonate IV Soln 8.4%: INTRAVENOUS | Qty: 50 | Status: AC

## 2020-11-28 MED FILL — Lidocaine HCl Local Soln Prefilled Syringe 100 MG/5ML (2%): INTRAMUSCULAR | Qty: 5 | Status: AC

## 2020-11-28 MED FILL — Heparin Sodium (Porcine) Inj 1000 Unit/ML: INTRAMUSCULAR | Qty: 30 | Status: AC

## 2020-11-28 MED FILL — Sodium Chloride IV Soln 0.9%: INTRAVENOUS | Qty: 2000 | Status: AC

## 2020-11-28 MED FILL — Potassium Chloride Inj 2 mEq/ML: INTRAVENOUS | Qty: 40 | Status: AC

## 2020-11-28 MED FILL — Mannitol IV Soln 20%: INTRAVENOUS | Qty: 500 | Status: AC

## 2020-11-28 MED FILL — Electrolyte-R (PH 7.4) Solution: INTRAVENOUS | Qty: 4000 | Status: AC

## 2020-11-28 NOTE — Progress Notes (Signed)
CARDIAC REHAB PHASE I   Offered to walk with pt, pt states he has just awaken from a nap. Returned to walk with pt. Pt c/o nausea, noted to be in Afib. Pt given ginger ale and cool cloth, coached through purse lipped breathing. Pt states some ease in nausea. Encouraged continued ambulation as able. Will continue to follow.  [id number] Rufina Falco, RN BSN 11/28/2020 2:14 PM

## 2020-11-28 NOTE — Discharge Summary (Signed)
Physician Discharge Summary  Patient ID: Craig Taylor MRN: [id number] DOB/AGE: 79-May-1942 79 y.o.  Admit date: 11/21/2020 Discharge date: 12/01/2020  Admission Diagnoses:  Unstable Angina Pectoris CKD, stage III Hypertension Dyslipidemia Type 2 diabetes mellitus   Discharge Diagnoses:   Unstable angina (HCC) Coronary artery disease Dyslipidemia Hypertension Type 2 diabetes mellitus Expected acute blood loss anemia Stage 3a chronic kidney disease (HCC) S/P CABG x 3  History of Present Illness:       29 with man with h/o CAD s/p PCI 20 years ago and PMHx otherwise notable for bladder CA, HTN, HLD, DM presented with exertional angina worsening over past 2 weeks. Had mild SOB/DOE as well. Pain described in upper chest without radiation. Sought evaluation in ED where Troponins negative, but hx concerning for [place] and underwent LHC, which shows severe multivessel CAD. Referral received for CABG. Has been intermittently bradycardiac in 50s, asx. Admitted awaiting CABG; has been CP free as long as he remains at rest.  Hospital course: The patient was medically stabilized on aspirin, heparin and statin.  He was also continued on a beta-blocker.  He was seen in cardiothoracic surgical consultation by Dr. Julien Girt who evaluated the patient and all relevant studies and recommended proceeding with CABG.  On 11/26/2020 he was taken to the operating room where he underwent CABG x3.  He tolerated the procedure well and was taken to the surgical intensive care unit in stable condition.  Postoperative hospital course:  The patient is doing well.  He was weaned from the ventilator using standard post cardiac surgical protocols.  Swan-Ganz and arterial line were removed on postoperative day #1.  He was noted to have a mild expected acute blood loss anemia which is being monitored clinically.  Renal function has showed short term AKI with peak creat of 1.46. It is trending down over time..  Pain is  under good control.  He has been started on colchicine for anti-inflammatory effect.  All routine lines, monitors and drainage devices have been discontinued in the standard fashion.  He has developed postoperative atrial fibrillation and subsequently has been started on amiodarone.  Additionally he was started on apixaban.  He does have an expected acute blood loss anemia and values have stabilized.  Most recent hemoglobin and hematocrit dated 11/29/2020 are 9.2/29.1 respectively.  He does have a postoperative expected volume overload but has responding well to diuretics.  They have subsequently been stopped on postoperative day #4.  We will check a BMET on the morning of postop day #5 to reevaluate stability of renal function.  He has had some postoperative hypertension and Vasotec has been reordered.  He was taking this preoperatively.  Overall he is felt to be making excellent ongoing recovery.  His incisions are healing well without evidence of infection.  He is tolerating diet.  Oxygen has been weaned and he maintains good saturations on room air.  He is tolerating gradually increasing activities using standard cardiac rehab protocols.  At the time of discharge the patient's rhythm was noted to be stable and he is felt overall to be an acceptable candidate for discharge to home with family.  Discharged Condition: good  Consults: cardiology  Significant Diagnostic Studies:    ECHOCARDIOGRAM REPORT       Patient Name:  Craig Taylor Date of Exam: 11/22/2020  Medical Rec #: [id number]    Height:    78.0 in  Accession #:  [id number]    Weight:    236.0 lb  Date of Birth: [birth year]    BSA:     2.422 m  Patient Age:  62 years     BP:      131/63 mmHg  Patient Gender: M        HR:      57 bpm.  Exam Location: Inpatient   Procedure: 2D Echo, 3D Echo, Cardiac Doppler, Color Doppler and Strain  Analysis   Indications:  Chest Pain R07.9     History:    Patient has prior history of Echocardiogram examinations,  most         recent 03/30/2016. CAD; Risk Factors:Hypertension,  Dyslipidemia         and Non-insulin dependent type 2 diabetes mellitus.  Chronic         kidney disease. History of cancer and COVID.    Sonographer:  Darlina Sicilian RDCS  Referring Phys: [id number] [place]    1. Left ventricular ejection fraction, by estimation, is 55 to 60%. The  left ventricle has normal function. The left ventricle has no regional  wall motion abnormalities. Left ventricular diastolic parameters are  consistent with Grade I diastolic  dysfunction (impaired relaxation). The average left ventricular global  longitudinal strain is 20.1 %. The global longitudinal strain is normal.  2. Right ventricular systolic function is normal. The right ventricular  size is normal. There is normal pulmonary artery systolic pressure. The  estimated right ventricular systolic pressure is 20.2 mmHg.  3. The mitral valve is normal in structure. No evidence of mitral valve  regurgitation. No evidence of mitral stenosis.  4. The aortic valve is tricuspid. Aortic valve regurgitation is not  visualized. No aortic stenosis is present.  5. Aortic dilatation noted. There is mild dilatation of the ascending  aorta, measuring 37 mm.  6. The inferior vena cava is normal in size with greater than 50%  respiratory variability, suggesting right atrial pressure of 3 mmHg.   FINDINGS  Left Ventricle: Left ventricular ejection fraction, by estimation, is 55  to 60%. The left ventricle has normal function. The left ventricle has no  regional wall motion abnormalities. The average left ventricular global  longitudinal strain is 20.1 %. The  global longitudinal strain is normal. The left ventricular internal  cavity size was normal in size. There is no left ventricular hypertrophy.  Left ventricular  diastolic parameters are consistent with Grade I  diastolic dysfunction (impaired relaxation).   Right Ventricle: The right ventricular size is normal. No increase in  right ventricular wall thickness. Right ventricular systolic function is  normal. There is normal pulmonary artery systolic pressure. The tricuspid  regurgitant velocity is 2.42 m/s, and  with an assumed right atrial pressure of 3 mmHg, the estimated right  ventricular systolic pressure is 54.2 mmHg.   Left Atrium: Left atrial size was normal in size.   Right Atrium: Right atrial size was normal in size.   Pericardium: There is no evidence of pericardial effusion.   Mitral Valve: The mitral valve is normal in structure. No evidence of  mitral valve regurgitation. No evidence of mitral valve stenosis.   Tricuspid Valve: The tricuspid valve is normal in structure. Tricuspid  valve regurgitation is mild.   Aortic Valve: The aortic valve is tricuspid. Aortic valve regurgitation is  not visualized. No aortic stenosis is present.   Pulmonic Valve: The pulmonic valve was not well visualized. Pulmonic valve  regurgitation is not visualized.   Aorta: The aortic root is normal  in size and structure and aortic  dilatation noted. There is mild dilatation of the ascending aorta,  measuring 37 mm.   Venous: The inferior vena cava is normal in size with greater than 50%  respiratory variability, suggesting right atrial pressure of 3 mmHg.   IAS/Shunts: No atrial level shunt detected by color flow Doppler.     LEFT VENTRICLE  PLAX 2D  LVIDd:     5.50 cm Diastology  LVIDs:     3.60 cm LV e' medial:  4.46 cm/s  LV PW:     1.00 cm LV E/e' medial: 11.1  LV IVS:    1.10 cm LV e' lateral:  6.09 cm/s  LVOT diam:   2.20 cm LV E/e' lateral: 8.2  LV SV:     92  LV SV Index:  38    2D Longitudinal Strain  LVOT Area:   3.80 cm 2D Strain GLS Avg:   20.1 %     RIGHT VENTRICLE  RV S prime:    8.38 cm/s  TAPSE (M-mode): 1.9 cm   LEFT ATRIUM       Index    RIGHT ATRIUM      Index  LA diam:    4.60 cm 1.90 cm/m RA Area:   12.30 cm  LA Vol (A2C):  60.5 ml 24.98 ml/m RA Volume:  21.90 ml 9.04 ml/m  LA Vol (A4C):  61.6 ml 25.43 ml/m  LA Biplane Vol: 62.6 ml 25.85 ml/m  AORTIC VALVE  LVOT Vmax:  99.20 cm/s  LVOT Vmean: 61.000 cm/s  LVOT VTI:  0.241 m    AORTA  Ao Root diam: 3.20 cm  Ao Asc diam: 3.70 cm   MITRAL VALVE        TRICUSPID VALVE  MV Area (PHT): 1.65 cm  TR Peak grad:  23.4 mmHg  MV Decel Time: 461 msec  TR Vmax:    242.00 cm/s  MV E velocity: 49.70 cm/s  MV A velocity: 87.40 cm/s SHUNTS  MV E/A ratio: 0.57    Systemic VTI: 0.24 m               Systemic Diam: 2.20 cm   Loralie Champagne MD  Electronically signed by Loralie Champagne MD  Signature Date/Time: 11/22/2020/3:01:52 PM     Procedures  LEFT HEART CATH AND CORONARY ANGIOGRAPHY    Conclusion    LV end diastolic pressure is normal.  There is no aortic valve stenosis.  Prox RCA to Mid RCA lesion is 10% stenosed.  Mid RCA lesion is 70% stenosed.  Dist RCA lesion is 60% stenosed.  Mid LM to Ost LAD lesion is 60% stenosed with 99% stenosed side branch in Ost Cx.  Ost LAD to Mid LAD lesion is 45% stenosed.   SUMMARY  Severe multivessel CAD:  Distal left main 55-60% involving 99% ostial LCx and ostial high OM1/RI; LCx otherwise has mild proximal and mid disease terminating as major branching lateral a OM branch  40% proximal LAD with relatively minimal disease throughout the remainder the LAD and major 1st Diag  Tandem mid and distal RCA lesions of 70% and 60%  Normal EDP.  Normal EF by echo    Treatments: surgery:  CARDIOTHORACIC SURGERY OPERATIVE NOTE  Date of Procedure:    11/26/2020  Preoperative Diagnosis:      Severe 3-vessel Coronary Artery Disease, unstable angina  Postoperative Diagnosis:     Same  Procedure:        Coronary Artery Bypass Grafting  x 3             Left Internal Mammary Artery to Distal Left Anterior Descending Coronary Artery, Saphenous Vein Graft to  Posterior Descending Coronary Artery, Saphenous Vein Graft to Obtuse Marginal Branch of Left Circumflex Coronary Artery, Endoscopic Vein Harvest from right thigh and Lower Leg  Surgeon:        B.  Murvin Natal , MD  Assistant:       Evonnie Pat, PA-C   Anesthesia:    General  Operative Findings: ? Preserved left ventricular systolic function ? Good quality left internal mammary artery conduit ? Good quality saphenous vein conduit ? Good quality target vessels for grafting  Discharge Exam: Blood pressure (!) 143/121, pulse 88, temperature 97.9 F (36.6 C), temperature source Oral, resp. rate 16, height 6\' 6"  (1.981 m), weight 110.7 kg, SpO2 96 %.  General appearance: alert, cooperative and no distress Heart: regular rate and rhythm, no further atrial fibrillation.  Lungs: breath sounds are clear, pulls 1500 on IS. Abdomen: benign Extremities: no edema Wound: the sternotomy and RLE EVH incisions are healing well  Disposition:   Discharge Instructions    Amb Referral to Cardiac Rehabilitation   Complete by: As directed    Diagnosis: CABG   CABG X ___: 3   After initial evaluation and assessments completed: Virtual Based Care may be provided alone or in conjunction with Phase 2 Cardiac Rehab based on patient barriers.: Yes     Allergies as of 12/01/2020      Reactions   Lipitor [atorvastatin]    pruritis   Voltaren [diclofenac Sodium] Other (See Comments)   "caused intestine ulcers with bleeding"   Penicillins Rash   Has patient had a PCN reaction causing immediate rash, facial/tongue/throat swelling, SOB or lightheadedness with hypotension: No Has patient had a PCN reaction causing severe rash involving mucus membranes or skin necrosis: No Has patient had a PCN reaction that required  hospitalization: No Has patient had a PCN reaction occurring within the last 10 years: No If all of the above answers are "NO", then may proceed with Cephalosporin use.      Medication List    STOP taking these medications   nitroGLYCERIN 0.4 MG SL tablet Commonly known as: NITROSTAT     TAKE these medications   amiodarone 200 MG tablet Commonly known as: PACERONE Take 1 tablet (200 mg total) by mouth 2 (two) times daily.   apixaban 5 MG Tabs tablet Commonly known as: ELIQUIS Take 1 tablet (5 mg total) by mouth 2 (two) times daily.   aspirin EC 81 MG tablet Take 81 mg by mouth daily.   Cinnamon 500 MG Tabs Take 1 tablet by mouth 2 (two) times daily.   colchicine 0.6 MG tablet Take 0.5 tablets (0.3 mg total) by mouth 2 (two) times daily. Discontinue for significant nausea or diarrhea   dorzolamide-timolol 22.3-6.8 MG/ML ophthalmic solution Commonly known as: COSOPT Place 1 drop into the right eye in the morning and at bedtime.   enalapril 2.5 MG tablet Commonly known as: VASOTEC Take 2.5 mg by mouth daily.   FISH OIL PO Take 1 tablet by mouth in the morning and at bedtime.   metFORMIN 500 MG tablet Commonly known as: GLUCOPHAGE Take 1,000 mg by mouth 2 (two) times daily with a meal.   metoprolol tartrate 50 MG tablet Commonly known as: LOPRESSOR Take 1 tablet (50 mg total) by mouth 2 (two) times daily.   simvastatin 20 MG  tablet Commonly known as: ZOCOR Take 1 tablet (20 mg total) by mouth every evening. What changed:   medication strength  how much to take  how to take this  when to take this  additional instructions   tamsulosin 0.4 MG Caps capsule Commonly known as: FLOMAX Take 0.4 mg by mouth in the morning and at bedtime.   traMADol 50 MG tablet Commonly known as: ULTRAM Take 1 tablet (50 mg total) by mouth every 6 (six) hours as needed for up to 7 days for moderate pain.   Vitamin D-3 125 MCG (5000 UT) Tabs Take 1 tablet by mouth at  bedtime.   zinc gluconate 50 MG tablet Take 50 mg by mouth daily.       Follow-up Information    Wonda Olds, MD. Go on 12/06/2020.   Specialty: Cardiothoracic Surgery Why: Your appointment is at 9:30am. Please arrive 37min early for a chest X-ray to be performed by [facility] located on the first floor of the same building.  Contact information: [address] [place] [address] [phone]        Verta Ellen., NP. Go on 12/17/2020.   Specialty: Cardiology Why: Your appointment is at 10:30am. Contact information: [place] [address] [phone]              The patient has been discharged on:   1.Beta Blocker:  Yes [ y  ]                              No   [   ]                              If No, reason:  2.Ace Inhibitor/ARB: Yes [  y ]                                     No  [    ]                                     If No, reason:  3.Statin:   Yes [ y  ]                  No  [   ]                  If No, reason:  4.Ecasa:  Yes  [ y  ]                  No   [   ]                  If No, reason:  Signed: Antony Odea  PA-C 12/01/2020, 10:01 AM

## 2020-11-28 NOTE — Progress Notes (Signed)
Misha RN aware of order to d/c CVC.

## 2020-11-28 NOTE — Progress Notes (Signed)
2 Days Post-Op Procedure(s) (LRB): CORONARY ARTERY BYPASS GRAFTING (CABG) TIMES 3 ON PUMP USING LEFT INTERNAL MAMMARY ARTERY AND ENDOSCOPICALLY HARVESTED RIGHT GREATER SAPHENOUS VEIN (N/A) TRANSESOPHAGEAL ECHOCARDIOGRAM (TEE) (N/A) ENDOVEIN HARVEST OF GREATER SAPHENOUS VEIN (Right) INDOCYANINE GREEN FLUORESCENCE IMAGING (ICG) Subjective: Feeling better Objective: Vital signs in last 24 hours: Temp:  [97.7 F (36.5 C)-98.7 F (37.1 C)] 98.2 F (36.8 C) (04/20 0731) Pulse Rate:  [64-91] 87 (04/20 0800) Cardiac Rhythm: Atrial paced (04/20 0800) Resp:  [11-21] 13 (04/20 0800) BP: (101-143)/(54-92) 136/72 (04/20 0800) SpO2:  [93 %-97 %] 96 % (04/20 0800) Arterial Line BP: (149-158)/(44-47) 158/47 (04/19 1035) Weight:  [142 kg] 112 kg (04/20 0605)  Hemodynamic parameters for last 24 hours:    Intake/Output from previous day: 04/19 0701 - 04/20 0700 In: 1095.3 [P.O.:80; I.V.:859.4; IV Piggyback:156] Out: 1290 [Urine:710; Emesis/NG output:200; Chest Tube:380] Intake/Output this shift: No intake/output data recorded.  General appearance: alert and cooperative Neurologic: intact Heart: regular rate and rhythm, S1, S2 normal, no murmur, click, rub or gallop Lungs: clear to auscultation bilaterally Abdomen: soft, non-tender; bowel sounds normal; no masses,  no organomegaly Extremities: extremities normal, atraumatic, no cyanosis or edema Wound: dressed, dry  Lab Results: Recent Labs    11/27/20 1554 11/28/20 0522  WBC 9.6 7.8  HGB 9.3* 9.0*  HCT 29.5* 28.1*  PLT 164 140*   BMET:  Recent Labs    11/27/20 1554 11/28/20 0522  NA 140 137  K 4.2 3.7  CL 109 106  CO2 26 26  GLUCOSE 143* 118*  BUN 24* 27*  CREATININE 1.46* 1.45*  CALCIUM 8.5* 8.6*    PT/INR:  Recent Labs    11/26/20 1217  LABPROT 15.5*  INR 1.2   ABG    Component Value Date/Time   PHART 7.295 (L) 11/26/2020 1806   HCO3 22.2 11/26/2020 1806   TCO2 24 11/26/2020 1806   ACIDBASEDEF 4.0 (H)  11/26/2020 1806   O2SAT 95.0 11/26/2020 1806   CBG (last 3)  Recent Labs    11/27/20 2342 11/28/20 0348 11/28/20 0730  GLUCAP 136* 102* 130*    Assessment/Plan: S/P Procedure(s) (LRB): CORONARY ARTERY BYPASS GRAFTING (CABG) TIMES 3 ON PUMP USING LEFT INTERNAL MAMMARY ARTERY AND ENDOSCOPICALLY HARVESTED RIGHT GREATER SAPHENOUS VEIN (N/A) TRANSESOPHAGEAL ECHOCARDIOGRAM (TEE) (N/A) ENDOVEIN HARVEST OF GREATER SAPHENOUS VEIN (Right) INDOCYANINE GREEN FLUORESCENCE IMAGING (ICG) Mobilize Plan for transfer to step-down: see transfer orders  Hold diuresis for now   LOS: 6 days    Wonda Olds 11/28/2020

## 2020-11-28 NOTE — Progress Notes (Signed)
[facility] BRIEF SICU PROGRESS NOTE  2 Days Post-Op  S/P Procedure(s) (LRB): CORONARY ARTERY BYPASS GRAFTING (CABG) TIMES 3 ON PUMP USING LEFT INTERNAL MAMMARY ARTERY AND ENDOSCOPICALLY HARVESTED RIGHT GREATER SAPHENOUS VEIN (N/A) TRANSESOPHAGEAL ECHOCARDIOGRAM (TEE) (N/A) ENDOVEIN HARVEST OF GREATER SAPHENOUS VEIN (Right) INDOCYANINE GREEN FLUORESCENCE IMAGING (ICG)   Stable day  Plan: Awaiting bed for transfer  Rexene Alberts, MD 11/28/2020 6:45 PM

## 2020-11-29 ENCOUNTER — Inpatient Hospital Stay (HOSPITAL_COMMUNITY): Payer: Medicare PPO

## 2020-11-29 ENCOUNTER — Other Ambulatory Visit (HOSPITAL_COMMUNITY): Payer: Self-pay

## 2020-11-29 LAB — BASIC METABOLIC PANEL
Anion gap: 6 (ref 5–15)
BUN: 23 mg/dL (ref 8–23)
CO2: 26 mmol/L (ref 22–32)
Calcium: 8.6 mg/dL — ABNORMAL LOW (ref 8.9–10.3)
Chloride: 105 mmol/L (ref 98–111)
Creatinine, Ser: 1.33 mg/dL — ABNORMAL HIGH (ref 0.61–1.24)
GFR, Estimated: 55 mL/min — ABNORMAL LOW (ref >60)
Glucose, Bld: 118 mg/dL — ABNORMAL HIGH (ref 70–99)
Potassium: 4 mmol/L (ref 3.5–5.1)
Sodium: 137 mmol/L (ref 135–145)

## 2020-11-29 LAB — COMPREHENSIVE METABOLIC PANEL
ALT: 16 U/L (ref 0–44)
AST: 23 U/L (ref 15–41)
Albumin: 2.9 g/dL — ABNORMAL LOW (ref 3.5–5.0)
Alkaline Phosphatase: 43 U/L (ref 38–126)
Anion gap: 6 (ref 5–15)
BUN: 25 mg/dL — ABNORMAL HIGH (ref 8–23)
CO2: 24 mmol/L (ref 22–32)
Calcium: 8.4 mg/dL — ABNORMAL LOW (ref 8.9–10.3)
Chloride: 107 mmol/L (ref 98–111)
Creatinine, Ser: 1.34 mg/dL — ABNORMAL HIGH (ref 0.61–1.24)
GFR, Estimated: 54 mL/min — ABNORMAL LOW (ref >60)
Glucose, Bld: 102 mg/dL — ABNORMAL HIGH (ref 70–99)
Potassium: 3.8 mmol/L (ref 3.5–5.1)
Sodium: 137 mmol/L (ref 135–145)
Total Bilirubin: 0.5 mg/dL (ref 0.3–1.2)
Total Protein: 5.3 g/dL — ABNORMAL LOW (ref 6.5–8.1)

## 2020-11-29 LAB — CBC
HCT: 28.3 % — ABNORMAL LOW (ref 39.0–52.0)
HCT: 29.1 % — ABNORMAL LOW (ref 39.0–52.0)
Hemoglobin: 9.1 g/dL — ABNORMAL LOW (ref 13.0–17.0)
Hemoglobin: 9.2 g/dL — ABNORMAL LOW (ref 13.0–17.0)
MCH: 28.2 pg (ref 26.0–34.0)
MCH: 28.3 pg (ref 26.0–34.0)
MCHC: 31.6 g/dL (ref 30.0–36.0)
MCHC: 32.2 g/dL (ref 30.0–36.0)
MCV: 88.2 fL (ref 80.0–100.0)
MCV: 89.3 fL (ref 80.0–100.0)
Platelets: 162 10*3/uL (ref 150–400)
Platelets: 167 10*3/uL (ref 150–400)
RBC: 3.21 MIL/uL — ABNORMAL LOW (ref 4.22–5.81)
RBC: 3.26 MIL/uL — ABNORMAL LOW (ref 4.22–5.81)
RDW: 13.7 % (ref 11.5–15.5)
RDW: 13.7 % (ref 11.5–15.5)
WBC: 8 10*3/uL (ref 4.0–10.5)
WBC: 8.2 10*3/uL (ref 4.0–10.5)
nRBC: 0 % (ref 0.0–0.2)
nRBC: 0 % (ref 0.0–0.2)

## 2020-11-29 LAB — GLUCOSE, CAPILLARY
Glucose-Capillary: 104 mg/dL — ABNORMAL HIGH (ref 70–99)
Glucose-Capillary: 111 mg/dL — ABNORMAL HIGH (ref 70–99)
Glucose-Capillary: 122 mg/dL — ABNORMAL HIGH (ref 70–99)
Glucose-Capillary: 125 mg/dL — ABNORMAL HIGH (ref 70–99)
Glucose-Capillary: 125 mg/dL — ABNORMAL HIGH (ref 70–99)

## 2020-11-29 MED ORDER — APIXABAN 5 MG PO TABS
5.0000 mg | ORAL_TABLET | Freq: Two times a day (BID) | ORAL | Status: DC
  Administered 2020-11-29 – 2020-12-01 (×5): 5 mg via ORAL
  Filled 2020-11-29 (×5): qty 1

## 2020-11-29 MED ORDER — METOPROLOL TARTRATE 25 MG/10 ML ORAL SUSPENSION
12.5000 mg | Freq: Two times a day (BID) | ORAL | Status: DC
  Filled 2020-11-29 (×4): qty 5

## 2020-11-29 MED ORDER — AMIODARONE HCL 200 MG PO TABS
400.0000 mg | ORAL_TABLET | Freq: Two times a day (BID) | ORAL | Status: DC
  Administered 2020-11-29 (×2): 400 mg via ORAL
  Filled 2020-11-29 (×2): qty 2

## 2020-11-29 MED ORDER — FUROSEMIDE 10 MG/ML IJ SOLN
60.0000 mg | Freq: Two times a day (BID) | INTRAMUSCULAR | Status: DC
  Administered 2020-11-29 – 2020-11-30 (×2): 60 mg via INTRAVENOUS
  Filled 2020-11-29 (×3): qty 6

## 2020-11-29 MED ORDER — ASPIRIN EC 81 MG PO TBEC
81.0000 mg | DELAYED_RELEASE_TABLET | Freq: Every day | ORAL | Status: DC
  Administered 2020-11-30 – 2020-12-01 (×2): 81 mg via ORAL
  Filled 2020-11-29 (×2): qty 1

## 2020-11-29 MED ORDER — METOPROLOL TARTRATE 25 MG PO TABS
25.0000 mg | ORAL_TABLET | Freq: Two times a day (BID) | ORAL | Status: DC
  Administered 2020-11-29 – 2020-11-30 (×4): 25 mg via ORAL
  Filled 2020-11-29 (×4): qty 1

## 2020-11-29 MED ORDER — AMIODARONE HCL 150 MG/3ML IV SOLN: 150.0000 mg | Freq: Once | INTRAVENOUS | Status: DC

## 2020-11-29 MED ORDER — AMIODARONE IV BOLUS ONLY 150 MG/100ML: 150.0000 mg | Freq: Once | INTRAVENOUS | Status: DC

## 2020-11-29 NOTE — Progress Notes (Signed)
Mobility Specialist: Progress Note   11/29/20 1552  Mobility  Activity Ambulated in hall  Level of Assistance Contact guard assist, steadying assist  Assistive Device Four wheel walker (EVA)  Distance Ambulated (ft) 370 ft  Mobility Response Tolerated fair  Mobility performed by Mobility specialist  $Mobility charge 1 Mobility   Pre-Mobility: 67 HR, 95% SpO2 Post-Mobility: 87 HR, 152/64 BP, 90% SpO2  Pt experienced LOB a couple times during ambulation, RN notified. Pt said his legs felt weak. Pt otherwise asx. Pt back to bed per request after walk with family member present in room.   [facility] Aroldo Galli Mobility Specialist Mobility Specialist Phone: [phone]

## 2020-11-29 NOTE — TOC Benefit Eligibility Note (Signed)
Patient Teacher, English as a foreign language completed.    The patient is currently admitted and upon discharge could be taking Eliquis 5 mg.  The current 30 day co-pay is, $40.00.   Patient's $480.00 deductible has been met.  The patient is insured through [place], [place] Patient Advocate Specialist [place] Team Direct Number: [phone]  Fax: [phone]

## 2020-11-29 NOTE — Progress Notes (Signed)
CARDIAC REHAB PHASE I   PRE:  Rate/Rhythm: 77 SR  BP:  Sitting: 107/64      SaO2: 95 RA  MODE:  Ambulation: 370 ft   POST:  Rate/Rhythm: 88 Afib/SR  BP:  Sitting: 147/68    SaO2: 91 RA   After some encouragement, pt agreeable to walk. Pt ambulated 341ft in hallway assist of one with EVA. Pt c/o nausea throughout session, but denies CP, SOB, or dizziness. Pt returned to recliner. Encouraged continued ambulation as able. Daughter at bedside. Will continue to follow.  [id number] Rufina Falco, RN BSN 11/29/2020 2:01 PM

## 2020-11-29 NOTE — Progress Notes (Addendum)
[address]       [place] [address]             [phone]      3 Days Post-Op Procedure(s) (LRB): CORONARY ARTERY BYPASS GRAFTING (CABG) TIMES 3 ON PUMP USING LEFT INTERNAL MAMMARY ARTERY AND ENDOSCOPICALLY HARVESTED RIGHT GREATER SAPHENOUS VEIN (N/A) TRANSESOPHAGEAL ECHOCARDIOGRAM (TEE) (N/A) ENDOVEIN HARVEST OF GREATER SAPHENOUS VEIN (Right) INDOCYANINE GREEN FLUORESCENCE IMAGING (ICG) Subjective: Feels pretty well overall  Objective: Vital signs in last 24 hours: Temp:  [98.1 F (36.7 C)-99.1 F (37.3 C)] 98.2 F (36.8 C) (04/21 0346) Pulse Rate:  [35-120] 78 (04/21 0700) Cardiac Rhythm: Normal sinus rhythm (04/21 0400) Resp:  [12-27] 17 (04/21 0700) BP: (121-155)/(52-115) 130/76 (04/21 0700) SpO2:  [87 %-100 %] 97 % (04/21 0700) Weight:  [110.7 kg] 110.7 kg (04/21 0500)  Hemodynamic parameters for last 24 hours:    Intake/Output from previous day: 04/20 0701 - 04/21 0700 In: 1490.6 [I.V.:1490.6] Out: 1700 [Urine:1700] Intake/Output this shift: No intake/output data recorded.  General appearance: alert, cooperative and no distress Heart: regular rate and rhythm Lungs: dim left base Abdomen: soft, non tender Extremities: no significant edema Wound: evh site ok, some echymosis, chest dressing CDI  Lab Results: Recent Labs    11/29/20 0029 11/29/20 0504  WBC 8.2 8.0  HGB 9.1* 9.2*  HCT 28.3* 29.1*  PLT 162 167   BMET:  Recent Labs    11/29/20 0029 11/29/20 0504  NA 137 137  K 3.8 4.0  CL 107 105  CO2 24 26  GLUCOSE 102* 118*  BUN 25* 23  CREATININE 1.34* 1.33*  CALCIUM 8.4* 8.6*    PT/INR:  Recent Labs    11/26/20 1217  LABPROT 15.5*  INR 1.2   ABG    Component Value Date/Time   PHART 7.295 (L) 11/26/2020 1806   HCO3 22.2 11/26/2020 1806   TCO2 24 11/26/2020 1806   ACIDBASEDEF 4.0 (H) 11/26/2020 1806   O2SAT 95.0 11/26/2020 1806   CBG (last 3)  Recent Labs    11/28/20 2006 11/28/20 2352 11/29/20 0343   GLUCAP 151* 111* 104*    Meds Scheduled Meds: . acetaminophen  1,000 mg Oral Q6H   Or  . acetaminophen (TYLENOL) oral liquid 160 mg/5 mL  1,000 mg Per Tube Q6H  . aspirin EC  325 mg Oral Daily   Or  . aspirin  324 mg Per Tube Daily  . bisacodyl  10 mg Oral Daily   Or  . bisacodyl  10 mg Rectal Daily  . Chlorhexidine Gluconate Cloth  6 each Topical Daily  . colchicine  0.3 mg Oral BID  . docusate sodium  200 mg Oral Daily  . dorzolamide-timolol  1 drop Right Eye BID  . furosemide  60 mg Intravenous BID  . insulin aspart  0-24 Units Subcutaneous Q4H  . mouth rinse  15 mL Mouth Rinse BID  . metoprolol tartrate  12.5 mg Oral BID   Or  . metoprolol tartrate  12.5 mg Per Tube BID  . pantoprazole  40 mg Oral Daily  . simvastatin  40 mg Oral QPM  . tamsulosin  0.4 mg Oral QHS   Continuous Infusions: PRN Meds:.calcium carbonate, levalbuterol, metoprolol tartrate, ondansetron (ZOFRAN) IV, oxyCODONE, sodium chloride flush, traMADol  Xrays DG Chest 2 View  Result Date: 11/29/2020 CLINICAL DATA:  Open-heart surgery. EXAM: CHEST - 2 VIEW COMPARISON:  Chest x-ray 11/28/2020. FINDINGS: Interim removal of right IJ sheath and  bilateral chest tubes. Prior CABG. Cardiomegaly with mild bilateral interstitial prominence and small bilateral pleural effusions suggesting mild CHF. Similar findings noted on prior exam. Persistent bibasilar atelectasis. No definite pneumothorax noted on today's exam. IMPRESSION: 1. Interim removal of right IJ sheath and bilateral chest tubes. No definite pneumothorax noted on today's exam. 2. Prior CABG. Cardiomegaly with bilateral mild interstitial prominence and small bilateral pleural effusions consistent with CHF. Similar findings noted on prior exam. 3.  Persistent bibasilar atelectasis. Electronically Signed   By: Marcello Moores  Register   On: 11/29/2020 07:47   DG Chest Port 1 View  Result Date: 11/28/2020 CLINICAL DATA:  Chest tube.  Open-heart surgery. EXAM: PORTABLE  CHEST 1 VIEW COMPARISON:  11/27/2020. FINDINGS: Interim removal of Swan-Ganz catheter. Right IJ sheath in stable position. Bilateral chest tubes in stable position. Mediastinal drainage catheter in stable position. Prior CABG. Cardiomegaly. Mild bilateral interstitial prominence and small bilateral pleural effusions again noted suggesting mild CHF. No interim change. Miniscule left apical pneumothorax cannot be excluded. IMPRESSION: 1. Interim removal Swan-Ganz catheter. Right IJ sheath in stable position. Bilateral chest tubes in stable position. Mediastinal drainage catheter stable position. Miniscule left apical pneumothorax cannot be excluded. 2. Prior CABG. Cardiomegaly. Mild bilateral interstitial prominence and small bilateral pleural effusions again noted suggesting mild CHF. No interim change. These results will be called to the ordering clinician or representative by the Radiologist Assistant, and communication documented in the PACS or [employer]. Electronically Signed   By: Marcello Moores  Register   On: 11/28/2020 06:08    Assessment/Plan: S/P Procedure(s) (LRB): CORONARY ARTERY BYPASS GRAFTING (CABG) TIMES 3 ON PUMP USING LEFT INTERNAL MAMMARY ARTERY AND ENDOSCOPICALLY HARVESTED RIGHT GREATER SAPHENOUS VEIN (N/A) TRANSESOPHAGEAL ECHOCARDIOGRAM (TEE) (N/A) ENDOVEIN HARVEST OF GREATER SAPHENOUS VEIN (Right) INDOCYANINE GREEN FLUORESCENCE IMAGING (ICG)  1 Tmax 99.1, VSS sBP 120's- 150's, sinus, PVC's currently and atrial fib - may need additional agent for afib if recurs. Most recent K= and Mg++ normal. Will increase metoprolol dose 2 sats ok on 2 liters 3 creat stable at 1.3 4 H/H- stable expected ABLA 9.2/29.1 5 BS well controlled- resime metformin when appetite improves 6 CXR stable c/w prev film  7 good UOP- lasix 60 IV ordered this am per MD 8 apixaban started  9 pulm hygiene/rehab modalities    LOS: 7 days    [name] PA-C Pager [phone] 11/29/2020 Pt seen and  examined chart rviewed. Agree with documentation. Eliquis for afib. Discharge planning. [name] Z. Orvan Seen, [place]

## 2020-11-29 NOTE — Progress Notes (Addendum)
Progress Note  Patient Name: Craig Taylor Date of Encounter: 11/29/2020  [facility] Cardiologist: Carlyle Dolly, MD  Subjective   "Can I go home tomorrow?"   Chest sore   No SOB   Inpatient Medications    Scheduled Meds: . acetaminophen  1,000 mg Oral Q6H   Or  . acetaminophen (TYLENOL) oral liquid 160 mg/5 mL  1,000 mg Per Tube Q6H  . aspirin EC  325 mg Oral Daily   Or  . aspirin  324 mg Per Tube Daily  . bisacodyl  10 mg Oral Daily   Or  . bisacodyl  10 mg Rectal Daily  . Chlorhexidine Gluconate Cloth  6 each Topical Daily  . colchicine  0.3 mg Oral BID  . docusate sodium  200 mg Oral Daily  . dorzolamide-timolol  1 drop Right Eye BID  . furosemide  60 mg Intravenous BID  . insulin aspart  0-24 Units Subcutaneous Q4H  . mouth rinse  15 mL Mouth Rinse BID  . metoprolol tartrate  12.5 mg Oral BID   Or  . metoprolol tartrate  12.5 mg Per Tube BID  . pantoprazole  40 mg Oral Daily  . simvastatin  40 mg Oral QPM  . tamsulosin  0.4 mg Oral QHS   Continuous Infusions:  PRN Meds: calcium carbonate, levalbuterol, metoprolol tartrate, ondansetron (ZOFRAN) IV, oxyCODONE, sodium chloride flush, traMADol   Vital Signs    Vitals:   11/29/20 0400 11/29/20 0500 11/29/20 0600 11/29/20 0700  BP: 127/68 122/73 126/63 130/76  Pulse: 85 80 (!) 106 78  Resp: (!) 27 18 20 17   Temp:      TempSrc:      SpO2: 99% 96% (!) 87% 97%  Weight:  110.7 kg    Height:        Intake/Output Summary (Last 24 hours) at 11/29/2020 0838 Last data filed at 11/29/2020 0600 Gross per 24 hour  Intake 1490.59 ml  Output 1700 ml  Net -209.41 ml   + 5 L   Last 3 Weights 11/29/2020 11/28/2020 11/27/2020  Weight (lbs) 244 lb 0.8 oz 246 lb 14.6 oz 246 lb 4.1 oz  Weight (kg) 110.7 kg 112 kg 111.7 kg      Telemetry    Atrial fibrillation   Rate 80s   Personally Reviewed  ECG    No new - Personally Reviewed  Physical Exam   GEN: No acute distress.   Neck: No JVD Cardiac: Irreg  irreg  No S3   .  Respiratory: Clear to auscultation bilaterally.  Decreased BS at bases   GI: Soft, nontender, non-distended  MS: No edema; No deformity. Neuro:  Nonfocal  Psych: Normal affect   Labs    High Sensitivity Troponin:   Recent Labs  Lab 11/21/20 1140 11/21/20 1500  TROPONINIHS 5 4      Chemistry Recent Labs  Lab 11/28/20 0522 11/29/20 0029 11/29/20 0504  NA 137 137 137  K 3.7 3.8 4.0  CL 106 107 105  CO2 26 24 26   GLUCOSE 118* 102* 118*  BUN 27* 25* 23  CREATININE 1.45* 1.34* 1.33*  CALCIUM 8.6* 8.4* 8.6*  PROT  --  5.3*  --   ALBUMIN  --  2.9*  --   AST  --  23  --   ALT  --  16  --   ALKPHOS  --  43  --   BILITOT  --  0.5  --   GFRNONAA 49* 54*  [place] 5 6 6      Hematology Recent Labs  Lab 11/28/20 0522 11/29/20 0029 11/29/20 0504  WBC 7.8 8.2 8.0  RBC 3.15* 3.21* 3.26*  HGB 9.0* 9.1* 9.2*  HCT 28.1* 28.3* 29.1*  MCV 89.2 88.2 89.3  MCH 28.6 28.3 28.2  MCHC 32.0 32.2 31.6  RDW 13.8 13.7 13.7  PLT 140* 162 167    BNPNo results for input(s): BNP, PROBNP in the last 168 hours.   DDimer No results for input(s): DDIMER in the last 168 hours.   Radiology    DG Chest 2 View  Result Date: 11/29/2020 CLINICAL DATA:  Open-heart surgery. EXAM: CHEST - 2 VIEW COMPARISON:  Chest x-ray 11/28/2020. FINDINGS: Interim removal of right IJ sheath and bilateral chest tubes. Prior CABG. Cardiomegaly with mild bilateral interstitial prominence and small bilateral pleural effusions suggesting mild CHF. Similar findings noted on prior exam. Persistent bibasilar atelectasis. No definite pneumothorax noted on today's exam. IMPRESSION: 1. Interim removal of right IJ sheath and bilateral chest tubes. No definite pneumothorax noted on today's exam. 2. Prior CABG. Cardiomegaly with bilateral mild interstitial prominence and small bilateral pleural effusions consistent with CHF. Similar findings noted on prior exam. 3.  Persistent bibasilar atelectasis.  Electronically Signed   By: Marcello Moores  Register   On: 11/29/2020 07:47   DG Chest Port 1 View  Result Date: 11/28/2020 CLINICAL DATA:  Chest tube.  Open-heart surgery. EXAM: PORTABLE CHEST 1 VIEW COMPARISON:  11/27/2020. FINDINGS: Interim removal of Swan-Ganz catheter. Right IJ sheath in stable position. Bilateral chest tubes in stable position. Mediastinal drainage catheter in stable position. Prior CABG. Cardiomegaly. Mild bilateral interstitial prominence and small bilateral pleural effusions again noted suggesting mild CHF. No interim change. Miniscule left apical pneumothorax cannot be excluded. IMPRESSION: 1. Interim removal Swan-Ganz catheter. Right IJ sheath in stable position. Bilateral chest tubes in stable position. Mediastinal drainage catheter stable position. Miniscule left apical pneumothorax cannot be excluded. 2. Prior CABG. Cardiomegaly. Mild bilateral interstitial prominence and small bilateral pleural effusions again noted suggesting mild CHF. No interim change. These results will be called to the ordering clinician or representative by the Radiologist Assistant, and communication documented in the PACS or [employer]. Electronically Signed   By: Marcello Moores  Register   On: 11/28/2020 06:08    Cardiac Studies   LHC   11/22/20    LV end diastolic pressure is normal.  There is no aortic valve stenosis.  Prox RCA to Mid RCA lesion is 10% stenosed.  Mid RCA lesion is 70% stenosed.  Dist RCA lesion is 60% stenosed.  Mid LM to Ost LAD lesion is 60% stenosed with 99% stenosed side branch in Ost Cx.  Ost LAD to Mid LAD lesion is 45% stenosed.   SUMMARY  Severe multivessel CAD:  Distal left main 55-60% involving 99% ostial LCx and ostial high OM1/RI; LCx otherwise has mild proximal and mid disease terminating as major branching lateral a OM branch  40% proximal LAD with relatively minimal disease throughout the remainder the LAD and major 1st Diag  Tandem mid and distal RCA  lesions of 70% and 60%  Normal EDP.  Normal EF by echo    Echo:  11/22/20 Left Ventricle: Left ventricular ejection fraction, by estimation, is 55  to 60%. The left ventricle has normal function. The left ventricle has no  regional wall motion abnormalities. The average left ventricular global  longitudinal strain is 20.1 %. The  global longitudinal strain is normal. The left ventricular  internal  cavity size was normal in size. There is no left ventricular hypertrophy.  Left ventricular diastolic parameters are consistent with Grade I  diastolic dysfunction (impaired relaxation).   Right Ventricle: The right ventricular size is normal. No increase in  right ventricular wall thickness. Right ventricular systolic function is  normal. There is normal pulmonary artery systolic pressure. The tricuspid  regurgitant velocity is 2.42 m/s, and  with an assumed right atrial pressure of 3 mmHg, the estimated right  ventricular systolic pressure is 91.4 mmHg.   Left Atrium: Left atrial size was normal in size.   Right Atrium: Right atrial size was normal in size.   Pericardium: There is no evidence of pericardial effusion.   Mitral Valve: The mitral valve is normal in structure. No evidence of  mitral valve regurgitation. No evidence of mitral valve stenosis.   Tricuspid Valve: The tricuspid valve is normal in structure. Tricuspid  valve regurgitation is mild.   Aortic Valve: The aortic valve is tricuspid. Aortic valve regurgitation is  not visualized. No aortic stenosis is present.   Pulmonic Valve: The pulmonic valve was not well visualized. Pulmonic valve  regurgitation is not visualized.   Aorta: The aortic root is normal in size and structure and aortic  dilatation noted. There is mild dilatation of the ascending aorta,  measuring 37 mm.   Venous: The inferior vena cava is normal in size with greater than 50%  respiratory variability, suggesting right atrial pressure of 3  mmHg.   IAS/Shunts: No atrial level shunt detected by color flow Doppler.  Patient Profile     80 y.o.malewith a PMH of CAD s/p stent to RCA in 2003, HTN, HLD, DM type 2, bladder cancer s/p TURBT 2020, CKD stage 3a, who presented with chest pain.  Assessment & Plan    1. Atrial fibrillation   Pt converted to atrial fibrllation yesterday  Prior to that had SR with lots of PACs    Review of echo report, atria are normal siz   Most tlkely this will be temporary   Rates OK and hemodynamics are OK Note mention of Eliquis  He is not on this now    Would recomm if OK surgically along with amiodarone (for short term)   Pt will probably do better in SR  2  CAD  CAD/Unstable angina   Cath last week as noted above    Pt now Day 3 post op from CABG (LIMA to LAD; SVT to PDA ; SVG tto OM) Agree with lasix as ordered       3  Hx HTN    Blood pressure has been 120s to 140s/     4  Hyperlipidemia - has been on long standing simva 40mg  daily, LDL 54 on admission. Continue current therapy.  - listed as having itching on atorvastatin in the past  For questions or updates, please contact [place] Please consult [email or web address] for contact info under        Signed, Dorris Carnes, MD  11/29/2020, 8:38 AM

## 2020-11-29 NOTE — Progress Notes (Signed)
Patient brought to 4E from [place]. Telemetry box applied, CCMD notified. Wife and daughter present. Patient stated 0/10 pain scale. Patient oriented to room and staff. Call bell in reach.   Daymon Larsen, RN

## 2020-11-29 NOTE — Discharge Instructions (Signed)
[facility] OFFICE NUMBER [id number]    Endoscopic Saphenous Vein Harvesting, Care After This sheet gives you information about how to care for yourself after your procedure. Your health care provider may also give you more specific instructions. If you have problems or questions, contact your health care provider. What can I expect after the procedure? After the procedure, it is common to have:  Pain.  Bruising.  Swelling.  Numbness. Follow these instructions at home: Incision care  Follow instructions from your health care provider about how to take care of your incisions. Make sure you: ? Wash your hands with soap and water before and after you change your bandages (dressings). If soap and water are not available, use hand sanitizer. ? Change your dressings as told by your health care provider. ? Leave stitches (sutures), skin glue, or adhesive strips in place. These skin closures may need to stay in place for 2 weeks or longer. If adhesive strip edges start to loosen and curl up, you may trim the loose edges. Do not remove adhesive strips completely unless your health care provider tells you to do that.  Check your incision areas every day for signs of infection. Check for: ? More redness, swelling, or pain. ? Fluid or blood. ? Warmth. ? Pus or a bad smell.   Medicines  Take over-the-counter and prescription medicines only as told by your health care provider.  Ask your health care provider if the medicine prescribed to you requires you to avoid driving or using heavy machinery. General instructions  Raise (elevate) your legs above the level of your heart while you are sitting or lying down.  Avoid crossing your legs.  Avoid sitting for long periods of time. Change positions every 30 minutes.  Do any exercises your health care providers have given you. These may include deep breathing, coughing, and walking exercises.  Do not take baths, swim, or use a hot tub until your  health care provider approves. Ask your health care provider if you may take showers. You may only be allowed to take sponge baths.  Wear compression stockings as told by your health care provider. These stockings help to prevent blood clots and reduce swelling in your legs.  Keep all follow-up visits as told by your health care provider. This is important. Contact a health care provider if:  Medicine does not help your pain.  Your pain gets worse.  You have new leg bruises or your leg bruises get bigger.  Your leg feels numb.  You have more redness, swelling, or pain around your incision.  You have fluid or blood coming from your incision.  Your incision feels warm to the touch.  You have pus or a bad smell coming from your incision.  You have a fever. Get help right away if:  Your pain is severe.  You develop pain, tenderness, warmth, redness, or swelling in any part of your leg.  You have chest pain.  You have trouble breathing. Summary  Raise (elevate) your legs above the level of your heart while you are sitting or lying down.  Wear compression stockings as told by your health care provider.  Make sure you know which symptoms should prompt you to contact your health care provider.  Keep all follow-up visits as told by your health care provider. This information is not intended to replace advice given to you by your health care provider. Make sure you discuss any questions you have with your health care provider. Document  Revised: 07/05/2018 Document Reviewed: 07/05/2018 Elsevier Patient Education  2021 [place]. Coronary Artery Bypass Grafting, Care After This sheet gives you information about how to care for yourself after your procedure. Your doctor may also give you more specific instructions. If you have problems or questions, call your doctor. What can I expect after the procedure? After the procedure, it is common to:  Feel sick to your stomach  (nauseous).  Not want to eat as much as normal (lack of appetite).  Have trouble pooping (constipation).  Have weakness and tiredness (fatigue).  Feel sad (depressed) or grouchy (irritable).  Have pain or discomfort around the cuts from surgery (incisions). Follow these instructions at home: Medicines  Take over-the-counter and prescription medicines only as told by your doctor. Do not stop taking medicines or start any new medicines unless your doctor says it is okay.  If you were prescribed an antibiotic medicine, take it as told by your doctor. Do not stop taking the antibiotic even if you start to feel better. Incision care  Follow instructions from your doctor about how to take care of your cuts from surgery. Make sure you: ? Wash your hands with soap and water before and after you change your bandage (dressing). If you cannot use soap and water, use hand sanitizer. ? Change your bandage as told by your doctor. ? Leave stitches (sutures), skin glue, or skin tape (adhesive) strips in place. They may need to stay in place for 2 weeks or longer. If tape strips get loose and curl up, you may trim the loose edges. Do not remove tape strips completely unless your doctor says it is okay.  Make sure the surgery cuts are clean, dry, and protected.  Check your cut areas every day for signs of infection. Check for: ? More redness, swelling, or pain. ? More fluid or blood. ? Warmth. ? Pus or a bad smell.  If cuts were made in your legs: ? Avoid crossing your legs. ? Avoid sitting for long periods of time. Change positions every 30 minutes. ? Raise (elevate) your legs when you are sitting.   Bathing  Do not take baths, swim, or use a hot tub until your doctor says it is okay.  YOU MAY SHOWER. Pat the surgery cuts dry. Do not rub the cuts to dry. Eating and drinking  Eat foods that are high in fiber, such as beans, nuts, whole grains, and raw fruits and vegetables. Any meats you eat  should be lean cut. Avoid canned, processed, and fried foods. This can help prevent trouble pooping. This is also a part of a heart-healthy diet.  Drink enough fluid to keep your pee (urine) pale yellow.  Do not drink alcohol until you are fully recovered. Ask your doctor when it is safe to drink alcohol.   Activity  Rest and limit your activity as told by your doctor. You may be told to: ? Stop any activity right away if you have chest pain, shortness of breath, irregular heartbeats, or dizziness. Get help right away if you have any of these symptoms. ? Move around often for short periods or take short walks as told by your doctor. Slowly increase your activities. ? Avoid lifting, pushing, or pulling anything that is heavier than 10 lb (4.5 kg) for at least 6 weeks or as told by your doctor.  Do physical therapy or a cardiac rehab (cardiac rehabilitation) program as told by your doctor. ? Physical therapy involves doing exercises to maintain  movement and build strength and endurance. ? A cardiac rehab program includes:  Exercise training.  Education.  Counseling.  Do not drive until your doctor says it is okay.  Ask your doctor when you can go back to work.  Ask your doctor when you can be sexually active. General instructions  Do not drive or use heavy machinery while taking prescription pain medicine.  Do not use any products that contain nicotine or tobacco. These include cigarettes, e-cigarettes, and chewing tobacco. If you need help quitting, ask your doctor.  Take 2-3 deep breaths every few hours during the day while you get better. This helps expand your lungs and prevent problems.  If you were given a device called an incentive spirometer, use it several times a day to practice deep breathing. Support your chest with a pillow or your arms when you take deep breaths or cough.  Wear compression stockings as told by your doctor.  Weigh yourself every day. This helps to  see if your body is holding (retaining) fluid that may make your heart and lungs work harder.  Keep all follow-up visits as told by your doctor. This is important. Contact a doctor if:  You have more redness, swelling, or pain around any cut.  You have more fluid or blood coming from any cut.  Any cut feels warm to the touch.  You have pus or a bad smell coming from any cut.  You have a fever.  You have swelling in your ankles or legs.  You have pain in your legs.  You gain 2 lb (0.9 kg) or more a day.  You feel sick to your stomach or you throw up (vomit).  You have watery poop (diarrhea). Get help right away if:  You have chest pain that goes to your jaw or arms.  You are short of breath.  You have a fast or irregular heartbeat.  You notice a "clicking" in your breastbone (sternum) when you move.  You have any signs of a stroke. "BE FAST" is an easy way to remember the main warning signs: ? B - Balance. Signs are dizziness, sudden trouble walking, or loss of balance. ? E - Eyes. Signs are trouble seeing or a change in how you see. ? F - Face. Signs are sudden weakness or loss of feeling of the face, or the face or eyelid drooping on one side. ? A - Arms. Signs are weakness or loss of feeling in an arm. This happens suddenly and usually on one side of the body. ? S - Speech. Signs are sudden trouble speaking, slurred speech, or trouble understanding what people say. ? T - Time. Time to call emergency services. Write down what time symptoms started.  You have other signs of a stroke, such as: ? A sudden, very bad headache with no known cause. ? Feeling sick to your stomach. ? Throwing up. ? Jerky movements you cannot control (seizure). These symptoms may be an emergency. Do not wait to see if the symptoms will go away. Get medical help right away. Call your local emergency services (911 in the [place] Do not drive yourself to the hospital. Summary  After the  procedure, it is common to have pain or discomfort in the cuts from surgery (incisions).  Do not take baths, swim, or use a hot tub until your doctor says it is okay.  Slowly increase your activities. You may need physical therapy or cardiac rehab.  Weigh yourself every day. This  helps to see if your body is holding fluid. This information is not intended to replace advice given to you by your health care provider. Make sure you discuss any questions you have with your health care provider. Document Revised: 04/06/2018 Document Reviewed: 04/06/2018 Elsevier Patient Education  2021 [place] on my medicine - ELIQUIS (apixaban)  This medication education was reviewed with me or my healthcare representative as part of my discharge preparation.    Why was Eliquis prescribed for you? Eliquis was prescribed for you to reduce the risk of a blood clot forming that can cause a stroke if you have a medical condition called atrial fibrillation (a type of irregular heartbeat).  What do You need to know about Eliquis ? Take your Eliquis TWICE DAILY - one tablet in the morning and one tablet in the evening with or without food. If you have difficulty swallowing the tablet whole please discuss with your pharmacist how to take the medication safely.  Take Eliquis exactly as prescribed by your doctor and DO NOT stop taking Eliquis without talking to the doctor who prescribed the medication.  Stopping may increase your risk of developing a stroke.  Refill your prescription before you run out.  After discharge, you should have regular check-up appointments with your healthcare provider that is prescribing your Eliquis.  In the future your dose may need to be changed if your kidney function or weight changes by a significant amount or as you get older.  What do you do if you miss a dose? If you miss a dose, take it as soon as you remember on the same day and resume taking twice  daily.  Do not take more than one dose of ELIQUIS at the same time to make up a missed dose.  Important Safety Information A possible side effect of Eliquis is bleeding. You should call your healthcare provider right away if you experience any of the following: ? Bleeding from an injury or your nose that does not stop. ? Unusual colored urine (red or dark brown) or unusual colored stools (red or black). ? Unusual bruising for unknown reasons. ? A serious fall or if you hit your head (even if there is no bleeding).  Some medicines may interact with Eliquis and might increase your risk of bleeding or clotting while on Eliquis. To help avoid this, consult your healthcare provider or pharmacist prior to using any new prescription or non-prescription medications, including herbals, vitamins, non-steroidal anti-inflammatory drugs (NSAIDs) and supplements.  This website has more information on Eliquis (apixaban): http://www.eliquis.com/eliquis/home

## 2020-11-29 NOTE — Progress Notes (Signed)
ANTICOAGULATION CONSULT NOTE - Initial Consult  Pharmacy Consult for apixaban  Indication: atrial fibrillation  Allergies  Allergen Reactions  . Lipitor [Atorvastatin]     pruritis  . Voltaren [Diclofenac Sodium] Other (See Comments)    "caused intestine ulcers with bleeding"  . Penicillins Rash    Has patient had a PCN reaction causing immediate rash, facial/tongue/throat swelling, SOB or lightheadedness with hypotension: No Has patient had a PCN reaction causing severe rash involving mucus membranes or skin necrosis: No Has patient had a PCN reaction that required hospitalization: No Has patient had a PCN reaction occurring within the last 10 years: No If all of the above answers are "NO", then may proceed with Cephalosporin use.     Patient Measurements: Height: 6\' 6"  (198.1 cm) Weight: 110.7 kg (244 lb 0.8 oz) IBW/kg (Calculated) : 91.4 Heparin Dosing Weight: 112 kg   Vital Signs: Temp: 98.5 F (36.9 C) (04/21 0852) Temp Source: Oral (04/21 0852) BP: 130/76 (04/21 0700) Pulse Rate: 78 (04/21 0700)  Labs: Recent Labs    11/26/20 1217 11/26/20 1351 11/28/20 0522 11/29/20 0029 11/29/20 0504  HGB 10.2*  9.8*   < > 9.0* 9.1* 9.2*  HCT 30.0*  30.4*   < > 28.1* 28.3* 29.1*  PLT 153   < > 140* 162 167  APTT 34  --   --   --   --   LABPROT 15.5*  --   --   --   --   INR 1.2  --   --   --   --   CREATININE  --    < > 1.45* 1.34* 1.33*   < > = values in this interval not displayed.    Estimated Creatinine Clearance: 64.2 mL/min (A) (by C-G formula based on SCr of 1.33 mg/dL (H)).   Medical History: Past Medical History:  Diagnosis Date  . Benign localized prostatic hyperplasia with lower urinary tract symptoms (LUTS)   . Bladder cancer [facility])    urologist--- dr winter---  s/p  TURBT 09/ 2020  . CKD (chronic kidney disease), stage III ([place])    neprologist--- dr Lowanda Foster  (12-15-2019  per pt was released to pcp at [facility] 02-06-2018 scanned in epic under media  .  Coronary artery disease cardiologist--- dr Bronson Ing   hx MI in 2003--- 08-02-2002  s/p cardiac cath with PCI and DES to [facility] and PDA;  cath 10-04-2002 patent stents w/ 30-40% AV groove Cx with ef 50% and mild inferior hypokinesis  . COVID-19 12/16/2019   asymptomatic  . Full dentures   . Glaucoma, right eye   . History of basal cell carcinoma (BCC) excision   . History of gout    per pt one episode 01/ 2021   . History of kidney stones   . History of MI (myocardial infarction) 08/01/2002  . History of ulcer of large intestine    per pt approx. 2002 told due to oral voltaren , colonoscopy/ egd with no intervention,  but did have blood transfusions  . Hyperlipidemia   . Hypertension    followed by pcp   (nuclear stress test in epic 05-11-2018  low risk w/ no ischemia, nuclear ef 55%)  . IDA (iron deficiency anemia)   . Nephrolithiasis   . Non-insulin dependent type 2 diabetes mellitus ([place])    followed by pcp  (12-15-2019   per pt checks blood sugar 3 times a week,  fasting sugar-- 120-140)  . S/P drug eluting coronary stent placement  08/02/2002   to proximal RCA and PDA  . Wears glasses     Medications:  Scheduled:  . acetaminophen  1,000 mg Oral Q6H   Or  . acetaminophen (TYLENOL) oral liquid 160 mg/5 mL  1,000 mg Per Tube Q6H  . aspirin EC  325 mg Oral Daily   Or  . aspirin  324 mg Per Tube Daily  . bisacodyl  10 mg Oral Daily   Or  . bisacodyl  10 mg Rectal Daily  . Chlorhexidine Gluconate Cloth  6 each Topical Daily  . colchicine  0.3 mg Oral BID  . docusate sodium  200 mg Oral Daily  . dorzolamide-timolol  1 drop Right Eye BID  . furosemide  60 mg Intravenous BID  . insulin aspart  0-24 Units Subcutaneous Q4H  . mouth rinse  15 mL Mouth Rinse BID  . metoprolol tartrate  25 mg Oral BID   Or  . metoprolol tartrate  12.5 mg Per Tube BID  . pantoprazole  40 mg Oral Daily  . simvastatin  40 mg Oral QPM  . tamsulosin  0.4 mg Oral QHS    Assessment: 89 yom who is  s/p CABG, now having PVC and in Afib - no AC PTA.  Hgb 9.2, plt 167. No s/sx of bleeding. Given age<80, wt>60 kg, and Scr <1.5 - qualifies for full dose apixaban. Also given starting DOAC, will reduce aspirin dose from 325 mg to 81 mg.   Goal of Therapy:  Monitor platelets by anticoagulation protocol: Yes   Plan:  Apixaban 5 mg BID  Reduce aspirin dose to 81 mg given starting DOAC Monitor CBC, Scr, and for s/sx of bleeding   Antonietta Jewel, PharmD, [place] Pharmacist  Phone: [phone] 11/29/2020 9:02 AM  Please check AMION for all [place] phone numbers After 10:00 PM, call [facility] [place] [phone]

## 2020-11-29 NOTE — Progress Notes (Signed)
      Pt reverted to SR   Given length of event I would go ahead and recomm amiodarone for now   Follow on tele as he starts ambulating   Without it I feel he will go back into afib    Note that Eliquis ordered  Signed, Dorris Carnes, MD  11/29/2020, 11:19 AM

## 2020-11-30 LAB — GLUCOSE, CAPILLARY
Glucose-Capillary: 170 mg/dL — ABNORMAL HIGH (ref 70–99)
Glucose-Capillary: 110 mg/dL — ABNORMAL HIGH (ref 70–99)
Glucose-Capillary: 116 mg/dL — ABNORMAL HIGH (ref 70–99)
Glucose-Capillary: 196 mg/dL — ABNORMAL HIGH (ref 70–99)
Glucose-Capillary: 141 mg/dL — ABNORMAL HIGH (ref 70–99)
Glucose-Capillary: 129 mg/dL — ABNORMAL HIGH (ref 70–99)

## 2020-11-30 MED ORDER — INSULIN ASPART 100 UNIT/ML ~~LOC~~ SOLN
0.0000 [IU] | Freq: Three times a day (TID) | SUBCUTANEOUS | Status: DC
  Administered 2020-11-30: 2 [IU] via SUBCUTANEOUS
  Administered 2020-12-01: 3 [IU] via SUBCUTANEOUS
  Administered 2020-12-01: 2 [IU] via SUBCUTANEOUS

## 2020-11-30 MED ORDER — ENALAPRIL MALEATE 2.5 MG PO TABS
2.5000 mg | ORAL_TABLET | Freq: Every day | ORAL | Status: DC
  Administered 2020-11-30 – 2020-12-01 (×2): 2.5 mg via ORAL
  Filled 2020-11-30 (×2): qty 1

## 2020-11-30 MED ORDER — SIMVASTATIN 20 MG PO TABS
20.0000 mg | ORAL_TABLET | Freq: Every evening | ORAL | Status: DC
  Administered 2020-11-30: 20 mg via ORAL
  Filled 2020-11-30: qty 1

## 2020-11-30 MED ORDER — AMIODARONE HCL 200 MG PO TABS
200.0000 mg | ORAL_TABLET | Freq: Two times a day (BID) | ORAL | Status: DC
  Administered 2020-11-30 – 2020-12-01 (×3): 200 mg via ORAL
  Filled 2020-11-30 (×3): qty 1

## 2020-11-30 NOTE — Discharge Summary (Incomplete)
Physician Discharge Summary  Patient ID: Craig Taylor MRN: [id number] DOB/AGE: 79-Apr-1942 79 y.o.  Admit date: 11/21/2020 Discharge date: 11/28/2020  Admission Diagnoses:  Discharge Diagnoses:  Principal Problem:   Unstable angina [facility]) Active Problems:   Stage 3a chronic kidney disease ([place])   S/P CABG x 3  Patient Active Problem List   Diagnosis Date Noted  . 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 Stage 3a chronic kidney disease ([place])   . Unstable angina ([place]) 11/21/2020  . IDA (iron deficiency anemia) 05/24/2018  . B12 deficiency 05/24/2018  . Constipation 05/24/2018  . Urinary tract infectious disease 06/15/2015  . UTI (lower urinary tract infection) 06/15/2015  . Essential hypertension 08/08/2013  . Hyperlipidemia 08/08/2013  . Type 2 diabetes mellitus ([place]) 08/08/2013  . Coronary artery disease 08/08/2013  . Pain in joint, lower leg 11/11/2011  . Stiffness of joint, not elsewhere classified, lower leg 11/11/2011   y of Present Illness:       28 with man with h/o CAD s/p PCI 20 years ago and PMHx otherwise notable for bladder CA, HTN, HLD, DM presented with exertional angina worsening over past 2 weeks. Had mild SOB/DOE as well. Pain described in upper chest without radiation. Sought evaluation in ED where Troponins negative, but hx concerning for [place] and underwent LHC, which shows severe multivessel CAD. Referral received for CABG. Has been intermittently bradycardiac in 50s, asx. Admitted awaiting CABG; has been CP free as long as he remains at rest.  Hospital course: The patient was medically stabilized on aspirin, heparin and statin.  He was also continued on a beta-blocker.  He was seen in cardiothoracic surgical consultation by Dr. Julien Girt who evaluated the patient and all relevant studies and recommended proceeding with CABG.  On 11/26/2020 he was taken to the operating room where he underwent CABG x3.  He tolerated the procedure well and was taken to the surgical intensive care  unit in stable condition.  Postoperative hospital course:  The patient is doing well.  He was weaned from the ventilator using standard post cardiac surgical protocols.  [place] and arterial line were removed on postoperative day #1.  He was noted to have a mild expected acute blood loss anemia which is being monitored clinically.  Renal function has remained within normal limits.  Pain is under good control.  He has been started on colchicine for anti-inflammatory effect.  All routine lines, monitors and drainage devices have been discontinued in the standard fashion.  He has developed postoperative atrial fibrillation and subsequently has been started on amiodarone.  Additionally he was started on apixaban.  He does have an expected acute blood loss anemia and values have stabilized.  Most recent hemoglobin and hematocrit dated 11/29/2020 are 9.2/29.1 respectively.  He does have a postoperative expected volume overload but has responding well to diuretics.  They were discontinued on postoperative day #4.  He has been met will be checked on the morning of 4  Discharged Condition: {condition:18240}   Consults: {consultation:18241}  Significant Diagnostic Studies: {diagnostics:18242}  Treatments: {Tx:18249}  Discharge Exam: Blood pressure 130/64, pulse 76, temperature 98.2 F (36.8 C), temperature source Oral, resp. rate 18, height 6\' 6"  (1.981 m), weight 112 kg, SpO2 98 %. {physical WLNL:8921194}  Disposition:    Allergies as of 11/28/2020      Reactions   Lipitor [atorvastatin]    pruritis   Voltaren [diclofenac Sodium] Other (See Comments)   "caused intestine ulcers with bleeding"   Penicillins Rash  Has patient had a PCN reaction causing immediate rash, facial/tongue/throat swelling, SOB or lightheadedness with hypotension: No Has patient had a PCN reaction causing severe rash involving mucus membranes or skin necrosis: No Has patient had a PCN reaction that required  hospitalization: No Has patient had a PCN reaction occurring within the last 10 years: No If all of the above answers are "NO", then may proceed with Cephalosporin use.    Med Rec must be completed prior to using this [facility]***        Signed: [name] 11/28/2020, 11:31 AM

## 2020-11-30 NOTE — Care Management Important Message (Signed)
Important Message  Patient Details  Name: ULMER DEGEN MRN: [id number] Date of Birth: 1940-11-24   Medicare Important Message Given:  Yes     Shelda Altes 11/30/2020, 10:50 AM

## 2020-11-30 NOTE — Progress Notes (Addendum)
[address]       [employer] [address]             [phone]      4 Days Post-Op Procedure(s) (LRB): CORONARY ARTERY BYPASS GRAFTING (CABG) TIMES 3 ON PUMP USING LEFT INTERNAL MAMMARY ARTERY AND ENDOSCOPICALLY HARVESTED RIGHT GREATER SAPHENOUS VEIN (N/A) TRANSESOPHAGEAL ECHOCARDIOGRAM (TEE) (N/A) ENDOVEIN HARVEST OF GREATER SAPHENOUS VEIN (Right) INDOCYANINE GREEN FLUORESCENCE IMAGING (ICG) Subjective: Some interm afib, mostly sinus, now on amio po  Objective: Vital signs in last 24 hours: Temp:  [97.7 F (36.5 C)-98.5 F (36.9 C)] 98.5 F (36.9 C) (04/22 0435) Pulse Rate:  [61-90] 80 (04/22 0435) Cardiac Rhythm: Normal sinus rhythm (04/21 1914) Resp:  [12-19] 16 (04/22 0435) BP: (114-150)/(61-80) 142/66 (04/22 0435) SpO2:  [95 %-99 %] 98 % (04/22 0435) Weight:  [110.7 kg] 110.7 kg (04/22 0435)  Hemodynamic parameters for last 24 hours:    Intake/Output from previous day: 04/21 0701 - 04/22 0700 In: 153.6 [I.V.:153.6] Out: 1750 [Urine:1750] Intake/Output this shift: No intake/output data recorded.  General appearance: alert, cooperative and no distress Heart: regular rate and rhythm Lungs: min dim in bases Abdomen: benign Extremities: no edema Wound: incis healing well  Lab Results: Recent Labs    11/29/20 0029 11/29/20 0504  WBC 8.2 8.0  HGB 9.1* 9.2*  HCT 28.3* 29.1*  PLT 162 167   BMET:  Recent Labs    11/29/20 0029 11/29/20 0504  NA 137 137  K 3.8 4.0  CL 107 105  CO2 24 26  GLUCOSE 102* 118*  BUN 25* 23  CREATININE 1.34* 1.33*  CALCIUM 8.4* 8.6*    PT/INR: No results for input(s): LABPROT, INR in the last 72 hours. ABG    Component Value Date/Time   PHART 7.295 (L) 11/26/2020 1806   HCO3 22.2 11/26/2020 1806   TCO2 24 11/26/2020 1806   ACIDBASEDEF 4.0 (H) 11/26/2020 1806   O2SAT 95.0 11/26/2020 1806   CBG (last 3)  Recent Labs    11/29/20 1601 11/29/20 2019 11/30/20 0417  GLUCAP 125* 125* 110*     Meds Scheduled Meds: . acetaminophen  1,000 mg Oral Q6H   Or  . acetaminophen (TYLENOL) oral liquid 160 mg/5 mL  1,000 mg Per Tube Q6H  . amiodarone  400 mg Oral BID  . apixaban  5 mg Oral BID  . aspirin EC  81 mg Oral Daily  . bisacodyl  10 mg Oral Daily   Or  . bisacodyl  10 mg Rectal Daily  . Chlorhexidine Gluconate Cloth  6 each Topical Daily  . colchicine  0.3 mg Oral BID  . docusate sodium  200 mg Oral Daily  . dorzolamide-timolol  1 drop Right Eye BID  . furosemide  60 mg Intravenous BID  . insulin aspart  0-24 Units Subcutaneous Q4H  . mouth rinse  15 mL Mouth Rinse BID  . metoprolol tartrate  25 mg Oral BID   Or  . metoprolol tartrate  12.5 mg Per Tube BID  . pantoprazole  40 mg Oral Daily  . simvastatin  40 mg Oral QPM  . tamsulosin  0.4 mg Oral QHS   Continuous Infusions: PRN Meds:.calcium carbonate, levalbuterol, metoprolol tartrate, ondansetron (ZOFRAN) IV, oxyCODONE, sodium chloride flush, traMADol  Xrays DG Chest 2 View  Result Date: 11/29/2020 CLINICAL DATA:  Open-heart surgery. EXAM: CHEST - 2 VIEW COMPARISON:  Chest x-ray 11/28/2020. FINDINGS: Interim removal of right IJ sheath and bilateral chest tubes.  Prior CABG. Cardiomegaly with mild bilateral interstitial prominence and small bilateral pleural effusions suggesting mild CHF. Similar findings noted on prior exam. Persistent bibasilar atelectasis. No definite pneumothorax noted on today's exam. IMPRESSION: 1. Interim removal of right IJ sheath and bilateral chest tubes. No definite pneumothorax noted on today's exam. 2. Prior CABG. Cardiomegaly with bilateral mild interstitial prominence and small bilateral pleural effusions consistent with CHF. Similar findings noted on prior exam. 3.  Persistent bibasilar atelectasis. Electronically Signed   By: [place]   On: 11/29/2020 07:47    Assessment/Plan: S/P Procedure(s) (LRB): CORONARY ARTERY BYPASS GRAFTING (CABG) TIMES 3 ON PUMP USING LEFT INTERNAL  MAMMARY ARTERY AND ENDOSCOPICALLY HARVESTED RIGHT GREATER SAPHENOUS VEIN (N/A) TRANSESOPHAGEAL ECHOCARDIOGRAM (TEE) (N/A) ENDOVEIN HARVEST OF GREATER SAPHENOUS VEIN (Right) INDOCYANINE GREEN FLUORESCENCE IMAGING (ICG) POD#4 CABG 1 afeb, VSS, some sBP elev readings to 150's, some afib , mostly sinus rhythm 2 sats good on RA 3 getting stronger with rehab but some weakness, balance issues 4 no new labs 5 BS well controlled 6 d/c epw's 7 prob home 1-2 days if conts to progress well with rehab and rhythm is stable     LOS: 8 days    [name] PA-C Pager [phone] 11/30/2020 Pt seen and examined; agree with documentation and plan. Likely home over weekend. Sharif Rendell Z. Orvan Seen, [place]

## 2020-11-30 NOTE — Progress Notes (Signed)
Pt's epicardial pacing wires removed per MD order.  No complications to report at this time.  Pt tolerated well.  Pt placed on bedrest with vital checks per protocol.

## 2020-11-30 NOTE — Progress Notes (Addendum)
Progress Note  Patient Name: Craig Taylor Date of Encounter: 11/30/2020  [facility] Cardiologist: Carlyle Dolly, MD  Subjective   Chest sore  No SOB  Inpatient Medications    Scheduled Meds: . acetaminophen  1,000 mg Oral Q6H   Or  . acetaminophen (TYLENOL) oral liquid 160 mg/5 mL  1,000 mg Per Tube Q6H  . amiodarone  400 mg Oral BID  . apixaban  5 mg Oral BID  . aspirin EC  81 mg Oral Daily  . bisacodyl  10 mg Oral Daily   Or  . bisacodyl  10 mg Rectal Daily  . Chlorhexidine Gluconate Cloth  6 each Topical Daily  . colchicine  0.3 mg Oral BID  . docusate sodium  200 mg Oral Daily  . dorzolamide-timolol  1 drop Right Eye BID  . furosemide  60 mg Intravenous BID  . insulin aspart  0-24 Units Subcutaneous Q4H  . mouth rinse  15 mL Mouth Rinse BID  . metoprolol tartrate  25 mg Oral BID   Or  . metoprolol tartrate  12.5 mg Per Tube BID  . pantoprazole  40 mg Oral Daily  . simvastatin  40 mg Oral QPM  . tamsulosin  0.4 mg Oral QHS   Continuous Infusions:  PRN Meds: calcium carbonate, levalbuterol, metoprolol tartrate, ondansetron (ZOFRAN) IV, oxyCODONE, sodium chloride flush, traMADol   Vital Signs    Vitals:   11/29/20 1413 11/29/20 1600 11/29/20 2019 11/30/20 0435  BP: (!) 143/65 (!) 150/71 (!) 150/69 (!) 142/66  Pulse: 69 66 71 80  Resp: 13 19 16 16   Temp: 98.3 F (36.8 C) 98 F (36.7 C) 98.3 F (36.8 C) 98.5 F (36.9 C)  TempSrc: Oral Oral Oral Oral  SpO2: 98% 99% 96% 98%  Weight:    110.7 kg  Height:        Intake/Output Summary (Last 24 hours) at 11/30/2020 0615 Last data filed at 11/30/2020 0437 Gross per 24 hour  Intake 153.57 ml  Output 1750 ml  Net -1596.43 ml    Last 3 Weights 11/30/2020 11/29/2020 11/28/2020  Weight (lbs) 244 lb 0.8 oz 244 lb 0.8 oz 246 lb 14.6 oz  Weight (kg) 110.7 kg 110.7 kg 112 kg      Telemetry    SR with PACs     Personally Reviewed  ECG    No new - Personally Reviewed  Physical Exam   GEN: No  acute distress.   Neck: No JVD Cardiac:  RRR  N omurmurs    No S3   .  Respiratory: Clear to auscultation bilaterally GI: Soft, nontender, non-distended  MS: No edema; No deformity. Neuro:  Nonfocal  Psych: Normal affect   Labs    High Sensitivity Troponin:   Recent Labs  Lab 11/21/20 1140 11/21/20 1500  TROPONINIHS 5 4      Chemistry Recent Labs  Lab 11/28/20 0522 11/29/20 0029 11/29/20 0504  NA 137 137 137  K 3.7 3.8 4.0  CL 106 107 105  CO2 26 24 26   GLUCOSE 118* 102* 118*  BUN 27* 25* 23  CREATININE 1.45* 1.34* 1.33*  CALCIUM 8.6* 8.4* 8.6*  PROT  --  5.3*  --   ALBUMIN  --  2.9*  --   AST  --  23  --   ALT  --  16  --   ALKPHOS  --  43  --   BILITOT  --  0.5  --   GFRNONAA 49*  54* 55*  ANIONGAP 5 6 6      Hematology Recent Labs  Lab 11/28/20 0522 11/29/20 0029 11/29/20 0504  WBC 7.8 8.2 8.0  RBC 3.15* 3.21* 3.26*  HGB 9.0* 9.1* 9.2*  HCT 28.1* 28.3* 29.1*  MCV 89.2 88.2 89.3  MCH 28.6 28.3 28.2  MCHC 32.0 32.2 31.6  RDW 13.8 13.7 13.7  PLT 140* 162 167    BNPNo results for input(s): BNP, PROBNP in the last 168 hours.   DDimer No results for input(s): DDIMER in the last 168 hours.   Radiology    DG Chest 2 View  Result Date: 11/29/2020 CLINICAL DATA:  Open-heart surgery. EXAM: CHEST - 2 VIEW COMPARISON:  Chest x-ray 11/28/2020. FINDINGS: Interim removal of right IJ sheath and bilateral chest tubes. Prior CABG. Cardiomegaly with mild bilateral interstitial prominence and small bilateral pleural effusions suggesting mild CHF. Similar findings noted on prior exam. Persistent bibasilar atelectasis. No definite pneumothorax noted on today's exam. IMPRESSION: 1. Interim removal of right IJ sheath and bilateral chest tubes. No definite pneumothorax noted on today's exam. 2. Prior CABG. Cardiomegaly with bilateral mild interstitial prominence and small bilateral pleural effusions consistent with CHF. Similar findings noted on prior exam. 3.  Persistent  bibasilar atelectasis. Electronically Signed   By: Marcello Moores  Register   On: 11/29/2020 07:47    Cardiac Studies   LHC   11/22/20    LV end diastolic pressure is normal.  There is no aortic valve stenosis.  Prox RCA to Mid RCA lesion is 10% stenosed.  Mid RCA lesion is 70% stenosed.  Dist RCA lesion is 60% stenosed.  Mid LM to Ost LAD lesion is 60% stenosed with 99% stenosed side branch in Ost Cx.  Ost LAD to Mid LAD lesion is 45% stenosed.   SUMMARY  Severe multivessel CAD:  Distal left main 55-60% involving 99% ostial LCx and ostial high OM1/RI; LCx otherwise has mild proximal and mid disease terminating as major branching lateral a OM branch  40% proximal LAD with relatively minimal disease throughout the remainder the LAD and major 1st Diag  Tandem mid and distal RCA lesions of 70% and 60%  Normal EDP.  Normal EF by echo    Echo:  11/22/20 Left Ventricle: Left ventricular ejection fraction, by estimation, is 55  to 60%. The left ventricle has normal function. The left ventricle has no  regional wall motion abnormalities. The average left ventricular global  longitudinal strain is 20.1 %. The  global longitudinal strain is normal. The left ventricular internal  cavity size was normal in size. There is no left ventricular hypertrophy.  Left ventricular diastolic parameters are consistent with Grade I  diastolic dysfunction (impaired relaxation).   Right Ventricle: The right ventricular size is normal. No increase in  right ventricular wall thickness. Right ventricular systolic function is  normal. There is normal pulmonary artery systolic pressure. The tricuspid  regurgitant velocity is 2.42 m/s, and  with an assumed right atrial pressure of 3 mmHg, the estimated right  ventricular systolic pressure is 02.4 mmHg.   Left Atrium: Left atrial size was normal in size.   Right Atrium: Right atrial size was normal in size.   Pericardium: There is no evidence of  pericardial effusion.   Mitral Valve: The mitral valve is normal in structure. No evidence of  mitral valve regurgitation. No evidence of mitral valve stenosis.   Tricuspid Valve: The tricuspid valve is normal in structure. Tricuspid  valve regurgitation is mild.  Aortic Valve: The aortic valve is tricuspid. Aortic valve regurgitation is  not visualized. No aortic stenosis is present.   Pulmonic Valve: The pulmonic valve was not well visualized. Pulmonic valve  regurgitation is not visualized.   Aorta: The aortic root is normal in size and structure and aortic  dilatation noted. There is mild dilatation of the ascending aorta,  measuring 37 mm.   Venous: The inferior vena cava is normal in size with greater than 50%  respiratory variability, suggesting right atrial pressure of 3 mmHg.   IAS/Shunts: No atrial level shunt detected by color flow Doppler.  Patient Profile     80 y.o.malewith a PMH of CAD s/p stent to RCA in 2003, HTN, HLD, DM type 2, bladder cancer s/p TURBT 2020, CKD stage 3a, who presented with chest pain.  Assessment & Plan    1. Atrial fibrillation   Pt developed atrial fibrillation 2 days ago   Yesterday converted to SR    Amiodarone added (po) given the long duration of afib to help maintain SR Could back down to 200 mg po bid   Continue Eliquis    He has A LOT of atrial ectopy   When he was in afib a couple days ago, his heart rates were never elevated and he did not sense anything different    WOuld probably benefit from an event monitor after discharge  (couple months) to confirm he is not having more afib  Esp before any consideration of  tapering anticoagulation     2  CAD  CAD/Unstable angina   Cath last week as noted above    Pt now Day 4 post op from CABG (LIMA to LAD; SVT to PDA ; SVG tto OM)     3  Hx HTN    Blood pressure ranges 110s to 150/   Diuresing  Was on low dose enalopril (2.5) as well as metoprolol prior to admit    I would hold  on  changing for now given IV lasix use     4  Hyperlipidemia - has been on long standing simva 40mg  daily, LDL 54 on admission. Continue current therapy.  - listed as having itching on atorvastatin in the past  For questions or updates, please contact [place] Please consult [email or web address] for contact info under        Signed, Dorris Carnes, MD  11/30/2020, 6:15 AM

## 2020-11-30 NOTE — TOC Benefit Eligibility Note (Signed)
Transition of Care [facility]) Benefit Eligibility Note    Patient Details  Name: Craig Taylor MRN: [id number] Date of Birth: November 17, 1940   Medication/Dose: Arne Cleveland  5 MG BID  Covered?: Yes  Tier: 2 Drug  Prescription Coverage Preferred Pharmacy: [facility]  Spoke with Person/Company/Phone Number:: ANGELA  @ [employer] CB # [phone]  Co-Pay: $40.00  Prior Approval: No  Deductible: Met (OUT-OF-POCKET : Linton Ham Phone Number: 11/30/2020, 10:45 AM

## 2020-11-30 NOTE — Progress Notes (Signed)
CARDIAC REHAB PHASE I   PRE:  Rate/Rhythm: 84 Afib  BP:  Sitting: 128/59      SaO2: 94 RA  MODE:  Ambulation: 470 ft   POST:  Rate/Rhythm: 102 Afib  BP:  Sitting: 134/84    SaO2: 93 RA   Pt ambulated 474ft in hallway assist of one with front wheel walker. Pt denies CP, SOB, or dizziness. Pt returned to recliner. Education completed with pts daughter. Pt educated on importance of site care and monitoring incisions daily. Encouraged continued ambulation, IS use, and sternal precautions. Pt given in-the-tube sheet along with heart healthy and diabetic diets. Reviewed restrictions and exercise guidelines. Will refer to CRP II [place].  [id number] Rufina Falco, RN BSN 11/30/2020 10:21 AM

## 2020-11-30 NOTE — Progress Notes (Signed)
Mobility Specialist: Progress Note   11/30/20 1230  Mobility  Activity Ambulated in hall  Level of Assistance Standby assist, set-up cues, supervision of patient - no hands on  Assistive Device Front wheel walker  Distance Ambulated (ft) 470 ft  Mobility Response Tolerated well  Mobility performed by Mobility specialist  $Mobility charge 1 Mobility   Pre-Mobility: 73 HR Post-Mobility: 77 HR, 142/70 BP, 94% SpO2  Pt c/o shoulders being sore during ambulation from using RW. Encouraged pt to not lean on RW. Pt otherwise asx and said his legs did not feel as weak today. Also encouraged pt to go on at least one more walk today. Pt back to bed per request to take a nap.  [facility] Yulieth Carrender Mobility Specialist Mobility Specialist Phone: [phone]

## 2020-11-30 NOTE — Progress Notes (Signed)
Mobility Specialist: Progress Note   11/30/20 1619  Mobility  Activity Ambulated in hall  Level of Assistance Modified independent, requires aide device or extra time  Assistive Device Front wheel walker  Distance Ambulated (ft) 470 ft  Mobility Response Tolerated well  Mobility performed by Mobility specialist  $Mobility charge 1 Mobility   Pt asx during ambulation.   [facility] Shareese Macha Mobility Specialist Mobility Specialist Phone: [phone]

## 2020-12-01 ENCOUNTER — Other Ambulatory Visit: Payer: Self-pay | Admitting: Physician Assistant

## 2020-12-01 DIAGNOSIS — I480 Paroxysmal atrial fibrillation: Secondary | ICD-10-CM

## 2020-12-01 DIAGNOSIS — Z951 Presence of aortocoronary bypass graft: Secondary | ICD-10-CM

## 2020-12-01 DIAGNOSIS — I4891 Unspecified atrial fibrillation: Secondary | ICD-10-CM

## 2020-12-01 DIAGNOSIS — I9789 Other postprocedural complications and disorders of the circulatory system, not elsewhere classified: Secondary | ICD-10-CM

## 2020-12-01 DIAGNOSIS — E7800 Pure hypercholesterolemia, unspecified: Secondary | ICD-10-CM

## 2020-12-01 DIAGNOSIS — I2510 Atherosclerotic heart disease of native coronary artery without angina pectoris: Secondary | ICD-10-CM

## 2020-12-01 LAB — BASIC METABOLIC PANEL
Anion gap: 10 (ref 5–15)
BUN: 20 mg/dL (ref 8–23)
CO2: 27 mmol/L (ref 22–32)
Calcium: 8.5 mg/dL — ABNORMAL LOW (ref 8.9–10.3)
Chloride: 98 mmol/L (ref 98–111)
Creatinine, Ser: 1.29 mg/dL — ABNORMAL HIGH (ref 0.61–1.24)
GFR, Estimated: 57 mL/min — ABNORMAL LOW (ref >60)
Glucose, Bld: 140 mg/dL — ABNORMAL HIGH (ref 70–99)
Potassium: 3.4 mmol/L — ABNORMAL LOW (ref 3.5–5.1)
Sodium: 135 mmol/L (ref 135–145)

## 2020-12-01 LAB — GLUCOSE, CAPILLARY
Glucose-Capillary: 139 mg/dL — ABNORMAL HIGH (ref 70–99)
Glucose-Capillary: 151 mg/dL — ABNORMAL HIGH (ref 70–99)

## 2020-12-01 MED ORDER — COLCHICINE 0.6 MG PO TABS: 0.3000 mg | ORAL_TABLET | Freq: Two times a day (BID) | ORAL | 0 refills | Status: DC

## 2020-12-01 MED ORDER — METOPROLOL TARTRATE 50 MG PO TABS
50.0000 mg | ORAL_TABLET | Freq: Two times a day (BID) | ORAL | Status: DC
  Administered 2020-12-01: 50 mg via ORAL
  Filled 2020-12-01: qty 1

## 2020-12-01 MED ORDER — TRAMADOL HCL 50 MG PO TABS: 50.0000 mg | ORAL_TABLET | Freq: Four times a day (QID) | ORAL | 0 refills | Status: AC | PRN

## 2020-12-01 MED ORDER — METOPROLOL TARTRATE 50 MG PO TABS: 50.0000 mg | ORAL_TABLET | Freq: Two times a day (BID) | ORAL | 2 refills | Status: DC

## 2020-12-01 MED ORDER — SIMVASTATIN 20 MG PO TABS: 20.0000 mg | ORAL_TABLET | Freq: Every evening | ORAL | 2 refills | Status: DC

## 2020-12-01 MED ORDER — APIXABAN 5 MG PO TABS: 5.0000 mg | ORAL_TABLET | Freq: Two times a day (BID) | ORAL | 1 refills | Status: DC

## 2020-12-01 MED ORDER — POTASSIUM CHLORIDE ER 10 MEQ PO TBCR
40.0000 meq | EXTENDED_RELEASE_TABLET | Freq: Once | ORAL | Status: AC
  Administered 2020-12-01: 40 meq via ORAL
  Filled 2020-12-01 (×2): qty 4

## 2020-12-01 MED ORDER — AMIODARONE HCL 200 MG PO TABS: 200.0000 mg | ORAL_TABLET | Freq: Two times a day (BID) | ORAL | 1 refills | Status: DC

## 2020-12-01 NOTE — Progress Notes (Unsigned)
Discharge medications re-sent to [place] per patient's request.

## 2020-12-01 NOTE — Progress Notes (Signed)
Pt discharged to home with wife.  Pt's IV removed.  Pt taken off telemetry and CCMD notified.  Pt left with all of their personal belongings.  AVS documentation reviewed and sent home with Pt and all questions answered.

## 2020-12-01 NOTE — Progress Notes (Signed)
Mobility Specialist: Progress Note   12/01/20 1134  Mobility  Activity Ambulated in hall  Level of Assistance Independent after set-up  Assistive Device Front wheel walker  Distance Ambulated (ft) 470 ft  Mobility Response Tolerated well  Mobility performed by Mobility specialist  $Mobility charge 1 Mobility   Pre-Mobility: 97 HR Post-Mobility: 81 HR, 94% SpO2  Pt asx during ambulation. Pt to bed after walk per request and is hopeful for discharge soon.   [facility] Mike Hamre Mobility Specialist Mobility Specialist Phone: [phone]

## 2020-12-01 NOTE — Progress Notes (Addendum)
[address]       [place] [address]             [phone]      5 Days Post-Op Procedure(s) (LRB): CORONARY ARTERY BYPASS GRAFTING (CABG) TIMES 3 ON PUMP USING LEFT INTERNAL MAMMARY ARTERY AND ENDOSCOPICALLY HARVESTED RIGHT GREATER SAPHENOUS VEIN (N/A) TRANSESOPHAGEAL ECHOCARDIOGRAM (TEE) (N/A) ENDOVEIN HARVEST OF GREATER SAPHENOUS VEIN (Right) INDOCYANINE GREEN FLUORESCENCE IMAGING (ICG) Subjective:  Up in the bedside chair, walked in the hall this morning with minimal assistance required. No new concerns. BM last evening.  He would like to return home today.   Objective: Vital signs in last 24 hours: Temp:  [97.7 F (36.5 C)-98.9 F (37.2 C)] 97.9 F (36.6 C) (04/23 0845) Pulse Rate:  [70-94] 88 (04/23 0634) Cardiac Rhythm: Normal sinus rhythm (04/23 0700) Resp:  [16-20] 16 (04/23 0845) BP: (130-160)/(70-121) 143/121 (04/23 0845) SpO2:  [93 %-97 %] 96 % (04/23 0845)   Intake/Output from previous day: 04/22 0701 - 04/23 0700 In: -  Out: 350 [Urine:350] Intake/Output this shift: No intake/output data recorded.  General appearance: alert, cooperative and no distress Heart: regular rate and rhythm, no further atrial fibrillation.  Lungs: breath sounds are clear, pulls 1500 on IS. Abdomen: benign Extremities: no edema Wound: the sternotomy and RLE EVH incisions are healing well  Lab Results: Recent Labs    11/29/20 0029 11/29/20 0504  WBC 8.2 8.0  HGB 9.1* 9.2*  HCT 28.3* 29.1*  PLT 162 167   BMET:  Recent Labs    11/29/20 0504 12/01/20 0159  NA 137 135  K 4.0 3.4*  CL 105 98  CO2 26 27  GLUCOSE 118* 140*  BUN 23 20  CREATININE 1.33* 1.29*  CALCIUM 8.6* 8.5*    PT/INR: No results for input(s): LABPROT, INR in the last 72 hours. ABG    Component Value Date/Time   PHART 7.295 (L) 11/26/2020 1806   HCO3 22.2 11/26/2020 1806   TCO2 24 11/26/2020 1806   ACIDBASEDEF 4.0 (H) 11/26/2020 1806   O2SAT 95.0 11/26/2020 1806    CBG (last 3)  Recent Labs    11/30/20 1730 11/30/20 2120 12/01/20 0631  GLUCAP 141* 129* 139*    Meds Scheduled Meds: . acetaminophen  1,000 mg Oral Q6H   Or  . acetaminophen (TYLENOL) oral liquid 160 mg/5 mL  1,000 mg Per Tube Q6H  . amiodarone  200 mg Oral BID  . apixaban  5 mg Oral BID  . aspirin EC  81 mg Oral Daily  . bisacodyl  10 mg Oral Daily   Or  . bisacodyl  10 mg Rectal Daily  . Chlorhexidine Gluconate Cloth  6 each Topical Daily  . colchicine  0.3 mg Oral BID  . docusate sodium  200 mg Oral Daily  . dorzolamide-timolol  1 drop Right Eye BID  . enalapril  2.5 mg Oral Daily  . insulin aspart  0-15 Units Subcutaneous TID WC  . mouth rinse  15 mL Mouth Rinse BID  . metoprolol tartrate  50 mg Oral BID  . pantoprazole  40 mg Oral Daily  . simvastatin  20 mg Oral QPM  . tamsulosin  0.4 mg Oral QHS   Continuous Infusions: PRN Meds:.calcium carbonate, levalbuterol, metoprolol tartrate, ondansetron (ZOFRAN) IV, oxyCODONE, sodium chloride flush, traMADol  Xrays No results found.  Assessment/Plan: S/P Procedure(s) (LRB): CORONARY ARTERY BYPASS GRAFTING (CABG) TIMES 3 ON PUMP USING LEFT INTERNAL MAMMARY ARTERY AND ENDOSCOPICALLY HARVESTED  RIGHT GREATER SAPHENOUS VEIN (N/A) TRANSESOPHAGEAL ECHOCARDIOGRAM (TEE) (N/A) ENDOVEIN HARVEST OF GREATER SAPHENOUS VEIN (Right) INDOCYANINE GREEN FLUORESCENCE IMAGING (ICG)   - POD#5 CABG x 3 1 afeb, VSS, SBP 140-150's.  Maintaining sinus rhythm 2 sats good on RA 3 Mobility improved.  4 Hypokalemia- supplement this morning. 5. A-fib- back in SR on amiodarone po. Continue Eliquis.    Disposition- plan for discharge today on amiodarone 200mg  bid for 10 days tapering to 200mg  daily,Eliquis 5mg  po BID,  metoprolol 50mg  po BID., ASA, Vasotec 2.5mg  po daily, and simvastatin 20mg  daily.   Macarthur Critchley, PA-C [phone]   LOS: 9 days    Chart reviewed, patient examined, agree with above. He feels well and is stable for  discharge.

## 2020-12-01 NOTE — Progress Notes (Signed)
CARDIAC REHAB PHASE I   PRE:  Rate/Rhythm: SR/88  BP:  Sitting: 123/84      SaO2: 93%  MODE:  Ambulation: 470 ft  (HR 101, SaO2 = 92%)  POST:  Rate/Rhythm: SR/98  BP:  Sitting: 141/74      SaO2: 93%  Pt received in chair, agrees to ambulate. Pt ambulates with steady gait using rolling walker. Pt had no c/o SOB, dizziness or pain. Pt back to chair with break on and call light at side. I/S encouraged 10 x/hr; (Pt pulling 1500 mL). Reviewed discharge education with pt and he voices no questions. Encouraged OOB to chair.   Lesly Rubenstein, MS, ACSM CEP, [facility] 12/01/2020  9:06-9:37

## 2020-12-01 NOTE — TOC Transition Note (Signed)
Transition of Care [facility]) - CM/SW Discharge Note   Patient Details  Name: [name] MRN: [id number] Date of Birth: 09-27-1940  Transition of Care [facility]) CM/SW Contact:  Zenon Mayo, RN Phone Number: 12/01/2020, 12:40 PM   Clinical Narrative:    Patient is for dc home today, he will  Be on eliquis, NCM gave him a eliquis coupon for the first 30 day free.  Informed him that his copay will be 40.00 for refills.  Also informed him that he can get a shower chair from [employer].  He has no other needs.    Final next level of care: Home/Self Care Barriers to Discharge: No Barriers Identified   Patient Goals and CMS Choice Patient states their goals for this hospitalization and ongoing recovery are:: return home   Choice offered to / list presented to : NA  Discharge Placement                       Discharge Plan and Services                  DME Agency: NA       HH Arranged: NA          Social Determinants of Health (SDOH) Interventions     Readmission Risk Interventions No flowsheet data found.

## 2020-12-01 NOTE — Progress Notes (Addendum)
Progress Note  Patient Name: Craig Taylor Date of Encounter: 12/01/2020  [facility] Cardiologist: Carlyle Dolly, MD  Subjective   Denies any angina or SOB.  Anxious to go home  Inpatient Medications    Scheduled Meds: . acetaminophen  1,000 mg Oral Q6H   Or  . acetaminophen (TYLENOL) oral liquid 160 mg/5 mL  1,000 mg Per Tube Q6H  . amiodarone  200 mg Oral BID  . apixaban  5 mg Oral BID  . aspirin EC  81 mg Oral Daily  . bisacodyl  10 mg Oral Daily   Or  . bisacodyl  10 mg Rectal Daily  . Chlorhexidine Gluconate Cloth  6 each Topical Daily  . colchicine  0.3 mg Oral BID  . docusate sodium  200 mg Oral Daily  . dorzolamide-timolol  1 drop Right Eye BID  . enalapril  2.5 mg Oral Daily  . insulin aspart  0-15 Units Subcutaneous TID WC  . mouth rinse  15 mL Mouth Rinse BID  . metoprolol tartrate  25 mg Oral BID   Or  . metoprolol tartrate  12.5 mg Per Tube BID  . pantoprazole  40 mg Oral Daily  . simvastatin  20 mg Oral QPM  . tamsulosin  0.4 mg Oral QHS   Continuous Infusions:  PRN Meds: calcium carbonate, levalbuterol, metoprolol tartrate, ondansetron (ZOFRAN) IV, oxyCODONE, sodium chloride flush, traMADol   Vital Signs    Vitals:   11/30/20 1822 11/30/20 2030 12/01/20 0052 12/01/20 0634  BP: (!) 156/80 (!) 144/78 (!) 141/76 (!) 153/85  Pulse:   94 88  Resp: 20 20 19 19   Temp:   98.8 F (37.1 C) 98.9 F (37.2 C)  TempSrc:   Oral Oral  SpO2:   93% 96%  Weight:      Height:       No intake or output data in the 24 hours ending 12/01/20 0752  Last 3 Weights 11/30/2020 11/29/2020 11/28/2020  Weight (lbs) 244 lb 0.8 oz 244 lb 0.8 oz 246 lb 14.6 oz  Weight (kg) 110.7 kg 110.7 kg 112 kg      Telemetry    NSR with PACs and PVCs   Personally Reviewed  ECG    No new - Personally Reviewed  Physical Exam   GEN: Well nourished, well developed in no acute distress HEENT: Normal NECK: No JVD; No carotid bruits LYMPHATICS: No  lymphadenopathy CARDIAC:RRR, no murmurs, rubs, gallops RESPIRATORY:  Clear to auscultation without rales, wheezing or rhonchi  ABDOMEN: Soft, non-tender, non-distended MUSCULOSKELETAL:  No edema; No deformity  SKIN: Warm and dry NEUROLOGIC:  Alert and oriented x 3 PSYCHIATRIC:  Normal affect   Labs    High Sensitivity Troponin:   Recent Labs  Lab 11/21/20 1140 11/21/20 1500  TROPONINIHS 5 4      Chemistry Recent Labs  Lab 11/29/20 0029 11/29/20 0504 12/01/20 0159  NA 137 137 135  K 3.8 4.0 3.4*  CL 107 105 98  CO2 24 26 27   GLUCOSE 102* 118* 140*  BUN 25* 23 20  CREATININE 1.34* 1.33* 1.29*  CALCIUM 8.4* 8.6* 8.5*  PROT 5.3*  --   --   ALBUMIN 2.9*  --   --   AST 23  --   --   ALT 16  --   --   ALKPHOS 43  --   --   BILITOT 0.5  --   --   GFRNONAA 54* 55* 57*  ANIONGAP 6 6  10     Hematology Recent Labs  Lab 11/28/20 0522 11/29/20 0029 11/29/20 0504  WBC 7.8 8.2 8.0  RBC 3.15* 3.21* 3.26*  HGB 9.0* 9.1* 9.2*  HCT 28.1* 28.3* 29.1*  MCV 89.2 88.2 89.3  MCH 28.6 28.3 28.2  MCHC 32.0 32.2 31.6  RDW 13.8 13.7 13.7  PLT 140* 162 167    BNPNo results for input(s): BNP, PROBNP in the last 168 hours.   DDimer No results for input(s): DDIMER in the last 168 hours.   Radiology    No results found.  Cardiac Studies   LHC   11/22/20    LV end diastolic pressure is normal.  There is no aortic valve stenosis.  Prox RCA to Mid RCA lesion is 10% stenosed.  Mid RCA lesion is 70% stenosed.  Dist RCA lesion is 60% stenosed.  Mid LM to Ost LAD lesion is 60% stenosed with 99% stenosed side branch in Ost Cx.  Ost LAD to Mid LAD lesion is 45% stenosed.   SUMMARY  Severe multivessel CAD:  Distal left main 55-60% involving 99% ostial LCx and ostial high OM1/RI; LCx otherwise has mild proximal and mid disease terminating as major branching lateral a OM branch  40% proximal LAD with relatively minimal disease throughout the remainder the LAD and major  1st Diag  Tandem mid and distal RCA lesions of 70% and 60%  Normal EDP.  Normal EF by echo    Echo:  11/22/20 Left Ventricle: Left ventricular ejection fraction, by estimation, is 55  to 60%. The left ventricle has normal function. The left ventricle has no  regional wall motion abnormalities. The average left ventricular global  longitudinal strain is 20.1 %. The  global longitudinal strain is normal. The left ventricular internal  cavity size was normal in size. There is no left ventricular hypertrophy.  Left ventricular diastolic parameters are consistent with Grade I  diastolic dysfunction (impaired relaxation).   Right Ventricle: The right ventricular size is normal. No increase in  right ventricular wall thickness. Right ventricular systolic function is  normal. There is normal pulmonary artery systolic pressure. The tricuspid  regurgitant velocity is 2.42 m/s, and  with an assumed right atrial pressure of 3 mmHg, the estimated right  ventricular systolic pressure is 41.3 mmHg.   Left Atrium: Left atrial size was normal in size.   Right Atrium: Right atrial size was normal in size.   Pericardium: There is no evidence of pericardial effusion.   Mitral Valve: The mitral valve is normal in structure. No evidence of  mitral valve regurgitation. No evidence of mitral valve stenosis.   Tricuspid Valve: The tricuspid valve is normal in structure. Tricuspid  valve regurgitation is mild.   Aortic Valve: The aortic valve is tricuspid. Aortic valve regurgitation is  not visualized. No aortic stenosis is present.   Pulmonic Valve: The pulmonic valve was not well visualized. Pulmonic valve  regurgitation is not visualized.   Aorta: The aortic root is normal in size and structure and aortic  dilatation noted. There is mild dilatation of the ascending aorta,  measuring 37 mm.   Venous: The inferior vena cava is normal in size with greater than 50%  respiratory variability,  suggesting right atrial pressure of 3 mmHg.   IAS/Shunts: No atrial level shunt detected by color flow Doppler.  Patient Profile     80 y.o.malewith a PMH of CAD s/p stent to RCA in 2003, HTN, HLD, DM type 2, bladder cancer s/p TURBT  2020, CKD stage 3a, who presented with chest pain.  Assessment & Plan    1. Atrial fibrillation    -Pt developed atrial fibrillation 2 days ago   -Converted to NSR on 4/22.  - Amiodarone added (po) given the long duration of afib to help maintain SR - now on 200 mg po bid   Continue Eliquis    - will increase BB to home does of Lopressor 50mg  BID (BP elevated) and follow for bradycardia on monitor>>HR in the high 80-90's at rest so likely will not develop significant bradycardia - would probably benefit from an event monitor after discharge  (couple months) to confirm he is not having more afib  Esp before any consideration of  tapering anticoagulation     2  CAD/Unstable angina    -Cath last week as noted above    -Pt now Day 4 post op from CABG (LIMA to LAD; SVT to PDA ; SVG tto OM)   -per CVTS  3  Hx HTN     -BP elevated at 153/27mmHg today -continue enalapril 2.5mg  daily (would not increase further due to CKD) -increase Lopressor to 50mg  BID (home dose)  4  Hyperlipidemia - has been on long standing simva 40mg  daily, LDL 54 on admission.  -Continue current therapy.  -listed as having itching on atorvastatin in the past  CARDIOLOGY RECOMMENDATIONS:  Discharge is anticipated in the next 48 hours. Recommendations for medications and follow up:  Discharge Medications: Continue medications as they are currently listed in the [facility]. Exceptions to the above:  Increase Lopressor to 50mg  BID  Follow Up: The patient's Primary Cardiologist is Carlyle Dolly, MD   Follow up in the office in 2 week(s).  I have spent a total of 30 minutes with patient reviewing cardiac cath , telemetry, EKGs, labs and examining patient as well as establishing an  assessment and plan that was discussed with the patient.  > 50% of time was spent in direct patient care.    Signed,  Fransico Him, MD  7:58 AM 12/01/2020  [facility]    For questions or updates, please contact [facility] Please consult [email or web address] for contact info under        Signed, Fransico Him, MD  12/01/2020, 7:52 AM

## 2020-12-04 ENCOUNTER — Other Ambulatory Visit: Payer: Self-pay | Admitting: Cardiothoracic Surgery

## 2020-12-04 DIAGNOSIS — Z951 Presence of aortocoronary bypass graft: Secondary | ICD-10-CM

## 2020-12-06 ENCOUNTER — Ambulatory Visit
Admission: RE | Admit: 2020-12-06 | Discharge: 2020-12-06 | Disposition: A | Discharge status: Home / Self Care | Payer: Medicare PPO | Source: Ambulatory Visit | Attending: Cardiothoracic Surgery | Admitting: Cardiothoracic Surgery

## 2020-12-06 ENCOUNTER — Other Ambulatory Visit: Payer: Self-pay

## 2020-12-06 ENCOUNTER — Ambulatory Visit (INDEPENDENT_AMBULATORY_CARE_PROVIDER_SITE_OTHER): Payer: Self-pay | Admitting: Cardiothoracic Surgery

## 2020-12-06 ENCOUNTER — Ambulatory Visit: Payer: Medicare PPO | Admitting: Cardiothoracic Surgery

## 2020-12-06 ENCOUNTER — Encounter: Payer: Self-pay | Admitting: Cardiothoracic Surgery

## 2020-12-06 VITALS — BP 121/64 | HR 82 | Temp 97.7°F | Resp 20 | Wt 234.0 lb

## 2020-12-06 DIAGNOSIS — Z951 Presence of aortocoronary bypass graft: Secondary | ICD-10-CM

## 2020-12-06 MED ORDER — ONDANSETRON HCL 4 MG PO TABS: 4.0000 mg | ORAL_TABLET | Freq: Every day | ORAL | 1 refills | Status: AC | PRN

## 2020-12-06 NOTE — Progress Notes (Signed)
[address]       [place] [address]             [phone]     CARDIOTHORACIC SURGERY OFFICE NOTE  Referring Provider is Branch, Alphonse Guild, MD Primary Cardiologist is Carlyle Dolly, MD PCP is Redmond School, MD   HPI:  80 year old man is status post CABG approximately 10 days ago.  He was discharged in just a few days.  He returns for his initial postoperative visit.  He states he has had some difficulty with nausea and vomiting this morning.  He denies any fevers and chills or chest pain.   Current Outpatient Medications  Medication Sig Dispense Refill  . apixaban (ELIQUIS) 5 MG TABS tablet Take 1 tablet (5 mg total) by mouth 2 (two) times daily. 60 tablet 1  . aspirin EC 81 MG tablet Take 81 mg by mouth daily.    . Cholecalciferol (VITAMIN D-3) 125 MCG (5000 UT) TABS Take 1 tablet by mouth at bedtime.     . Cinnamon 500 MG TABS Take 1 tablet by mouth 2 (two) times daily.     . dorzolamide-timolol (COSOPT) 22.3-6.8 MG/ML ophthalmic solution Place 1 drop into the right eye in the morning and at bedtime.    . enalapril (VASOTEC) 2.5 MG tablet Take 2.5 mg by mouth daily.     . metFORMIN (GLUCOPHAGE) 500 MG tablet Take 1,000 mg by mouth 2 (two) times daily with a meal.    . metoprolol tartrate (LOPRESSOR) 50 MG tablet Take 1 tablet (50 mg total) by mouth 2 (two) times daily. 60 tablet 2  . Omega-3 Fatty Acids (FISH OIL PO) Take 1 tablet by mouth in the morning and at bedtime.    . ondansetron (ZOFRAN) 4 MG tablet Take 1 tablet (4 mg total) by mouth daily as needed for nausea or vomiting. 30 tablet 1  . simvastatin (ZOCOR) 20 MG tablet Take 1 tablet (20 mg total) by mouth every evening. 30 tablet 2  . tamsulosin (FLOMAX) 0.4 MG CAPS capsule Take 0.4 mg by mouth in the morning and at bedtime.    . traMADol (ULTRAM) 50 MG tablet Take 1 tablet (50 mg total) by mouth every 6 (six) hours as needed for up to 7 days for moderate pain. 28 tablet 0  . zinc  gluconate 50 MG tablet Take 50 mg by mouth daily.     No current facility-administered medications for this visit.      Physical Exam:   BP 121/64 (BP Location: Left Arm, Patient Position: Sitting, Cuff Size: Normal)   Pulse 82   Temp 97.7 F (36.5 C) (Skin)   Resp 20   Wt 106.1 kg   SpO2 94% Comment: RA  BMI 27.04 kg/m   General:  Elderly no acute distress  Chest:   Clear to auscultation bilaterally  CV:   Regular rate and rhythm  Incisions:  Healing well  Abdomen:  Soft nontender  Extremities:  Minimal edema  Diagnostic Tests:  Chest x-ray from today is reviewed which demonstrates very small bilateral pleural effusions and clear lung fields   Impression:  80 year old man status post CABG.  Doing reasonably well except for nausea  Plan:  Follow-up in 2 weeks for wound check and wellness check Prescription for Zofran given Stop colchicine; stop amiodarone as these are most likely to contribute to nausea   I spent in excess of 15 minutes during the conduct of this office consultation and >  50% of this time involved direct face-to-face encounter with the patient for counseling and/or coordination of their care.  Level 2                 10 minutes Level 3                 15 minutes Level 4                 25 minutes Level 5                 40 minutes  B.  Murvin Natal, MD 12/06/2020 3:44 PM

## 2020-12-13 ENCOUNTER — Telehealth: Payer: Self-pay

## 2020-12-13 NOTE — Telephone Encounter (Signed)
Patient's wife, Craig Taylor, contacted the office.  Patient is s/p CABG x3 with Dr. Orvan Seen 11/26/20.  She stated he is having complaints of pain and a "knot" right above his right EVH site, above the incision medial to his knee. She stated that it was just noticed this morning.  The area is tender to touch, not red, no infection noted. Pictures sent to office phone.  Patient's wife advised to keep watch of the area and give [place] a call back early next week if the area gets larger and unbearable for it to be possibly drained in the office. Advised to take Tylenol for pain and could apply warm compresses to the area if needed.  She acknowledged receipt.

## 2020-12-16 NOTE — Progress Notes (Signed)
Cardiology Office Note  Date: 12/17/2020   ID: Craig Taylor, DOB June 09, 1941, MRN [id number]  PCP:  Redmond School, MD  Cardiologist:  Carlyle Dolly, MD Electrophysiologist:  None   Chief Complaint: Follow up CABG x 3   History of Present Illness: Craig Taylor is a 80 y.o. male with a history of CAD, HTN, HLD, CKD III, Bladder CA, DM2, IDA.  He presented to hospital with exertional angina worsening over the prior 2 weeks with associated SOB/DOE. In ER troponins were negative but history concerning for unstable angina.  Underwent left heart catheterization demonstrating severe multivessel CAD.  He was referred for CABG.  He was evaluated by cardiothoracic surgeon, Dr. Orvan Seen, who recommended proceeding with CABG.  On 11/26/2020 underwent CABG x3 with LIMA-distal LAD, SVG to PDA , SVG to OM of LCx,, endoscopic vein harvest from right thigh and lower leg.  He had short-term AKI with peak creatinine 1.46.  It was trending down over time.  He developed postop atrial fibrillation and subsequently was started on amiodarone and Eliquis.  His hemoglobin and hematocrit on 11/29/2020 were 9.2 and 29.1 respectively.  Had some postop volume overload but responded well to diuretics.  He was discharged on amiodarone 200 mg p.o. twice daily, apixaban 5 mg p.o. twice daily, aspirin 81 mg daily, enalapril 2.5 mg daily, metoprolol 50 mg p.o. twice daily, simvastatin 20 mg daily.  He is here status post recent CABG on 11/26/2020 by Dr. Orvan Seen.  States he is doing very well.  He denies any issues since his surgery except for some discomfort in his right leg from saphenous vein graft harvesting.  States he is having some mild swelling in upper leg and discomfort in the incision area from saphenous vein harvesting.  He denies any subsequent anginal or exertional symptoms, palpitations or arrhythmias, orthostatic symptoms, CVA or TIA-like symptoms, PND, orthopnea, bleeding issues, claudication-like symptoms,  DVT or PE-like symptoms, or lower extremity edema.  EKG today shows sinus rhythm with first-degree AV block rate of 67, possible lateral infarct age undetermined.  Denies any issues with median sternotomy scar or chest tube drainage is sites.  Blood pressure today on arrival 138/60.   Past Medical History:  Diagnosis Date  . Benign localized prostatic hyperplasia with lower urinary tract symptoms (LUTS)   . Bladder cancer [facility])    urologist--- dr winter---  s/p  TURBT 09/ 2020  . CKD (chronic kidney disease), stage III ([place])    neprologist--- dr Lowanda Foster  (12-15-2019  per pt was released to pcp at [facility] 02-06-2018 scanned in epic under media  . Coronary artery disease cardiologist--- dr Bronson Ing   hx MI in 2003--- 08-02-2002  s/p cardiac cath with PCI and DES to [facility] and PDA;  cath 10-04-2002 patent stents w/ 30-40% AV groove Cx with ef 50% and mild inferior hypokinesis  . COVID-19 12/16/2019   asymptomatic  . Full dentures   . Glaucoma, right eye   . History of basal cell carcinoma (BCC) excision   . History of gout    per pt one episode 01/ 2021   . History of kidney stones   . History of MI (myocardial infarction) 08/01/2002  . History of ulcer of large intestine    per pt approx. 2002 told due to oral voltaren , colonoscopy/ egd with no intervention,  but did have blood transfusions  . Hyperlipidemia   . Hypertension    followed by pcp   (nuclear stress test in epic 05-11-2018  low risk w/ no ischemia, nuclear ef 55%)  . IDA (iron deficiency anemia)   . Nephrolithiasis   . Non-insulin dependent type 2 diabetes mellitus ([place])    followed by pcp  (12-15-2019   per pt checks blood sugar 3 times a week,  fasting sugar-- 120-140)  . S/P drug eluting coronary stent placement 08/02/2002   to proximal RCA and PDA  . Wears glasses     Past Surgical History:  Procedure Laterality Date  . BIOPSY  07/13/2018   Procedure: BIOPSY;  Surgeon: Daneil Dolin, MD;  [facility];   Service: Endoscopy;;  (gastric)  . CARDIAC CATHETERIZATION  08/02/2002   dr berry   Crux of RCA 95% stenosis, stented with a 2.5x26mm CYPHER stent resulting in reduction of 95% stenosis to 0% residual; overlapping CYPHER stents -3x10mm and 3x4mm- deployed at 15atm resulting in reduction of 70% stenosis to 0% residual.  . CARDIAC CATHETERIZATION  10/04/2002    dr berry   patent stents w/ 30-40% AV groove LCx , mild inferior hypokinesis, ef 50%  . COLONOSCOPY N/A 05/31/2014   Dr. Gala Romney: Normal  . COLONOSCOPY N/A 07/13/2018   Procedure: COLONOSCOPY;  Surgeon: Daneil Dolin, MD;  [facility];  Service: Endoscopy;  Laterality: N/A;  8:30am  . CORONARY ARTERY BYPASS GRAFT N/A 11/26/2020   Procedure: CORONARY ARTERY BYPASS GRAFTING (CABG) TIMES 3 ON PUMP USING LEFT INTERNAL MAMMARY ARTERY AND ENDOSCOPICALLY HARVESTED RIGHT GREATER SAPHENOUS VEIN;  Surgeon: Wonda Olds, MD;  Location: [place];  Service: Open Heart Surgery;  Laterality: N/A;  . ENDOVEIN HARVEST OF GREATER SAPHENOUS VEIN Right 11/26/2020   Procedure: ENDOVEIN HARVEST OF GREATER SAPHENOUS VEIN;  Surgeon: Wonda Olds, MD;  Location: [place];  Service: Open Heart Surgery;  Laterality: Right;  . ESOPHAGOGASTRODUODENOSCOPY N/A 07/13/2018   Procedure: ESOPHAGOGASTRODUODENOSCOPY (EGD);  Surgeon: Daneil Dolin, MD;  [facility];  Service: Endoscopy;  Laterality: N/A;  . LEFT HEART CATH AND CORONARY ANGIOGRAPHY N/A 11/22/2020   Procedure: LEFT HEART CATH AND CORONARY ANGIOGRAPHY;  Surgeon: Leonie Man, MD;  Location: [place] [facility];  Service: Cardiovascular;  Laterality: N/A;  . TEE WITHOUT CARDIOVERSION N/A 11/26/2020   Procedure: TRANSESOPHAGEAL ECHOCARDIOGRAM (TEE);  Surgeon: Wonda Olds, MD;  Location: [place];  Service: Open Heart Surgery;  Laterality: N/A;  . TRANSURETHRAL RESECTION OF BLADDER TUMOR N/A 04/22/2019   Procedure: TRANSURETHRAL RESECTION OF BLADDER TUMOR (TURBT)/ CYSTOSCOPY BILATERAL  RETROGRADE PYELOGRAMS,  GEMCITABINE INSTILLATION;  Surgeon: Ceasar Mons, MD;  Location: [facility];  Service: Urology;  Laterality: N/A;  ONLY NEEDS 45 MIN  . TRANSURETHRAL RESECTION OF BLADDER TUMOR N/A 01/25/2020   Procedure: TRANSURETHRAL RESECTION OF BLADDER TUMOR (TURBT)/ CYSTOSCOPY;  Surgeon: Ceasar Mons, MD;  Location[facility];  Service: Urology;  Laterality: N/A;  ONLY NEEDS 30 MIN    Current Outpatient Medications  Medication Sig Dispense Refill  . apixaban (ELIQUIS) 5 MG TABS tablet Take 1 tablet (5 mg total) by mouth 2 (two) times daily. 60 tablet 1  . aspirin EC 81 MG tablet Take 81 mg by mouth daily.    . Cholecalciferol (VITAMIN D-3) 125 MCG (5000 UT) TABS Take 1 tablet by mouth at bedtime.     . Cinnamon 500 MG TABS Take 1 tablet by mouth 2 (two) times daily.     . dorzolamide-timolol (COSOPT) 22.3-6.8 MG/ML ophthalmic solution Place 1 drop into the right eye in the morning  and at bedtime.    . enalapril (VASOTEC) 2.5 MG tablet Take 2.5 mg by mouth daily.     . metFORMIN (GLUCOPHAGE) 500 MG tablet Take 1,000 mg by mouth 2 (two) times daily with a meal.    . metoprolol tartrate (LOPRESSOR) 50 MG tablet Take 1 tablet (50 mg total) by mouth 2 (two) times daily. 60 tablet 2  . Omega-3 Fatty Acids (FISH OIL PO) Take 1 tablet by mouth in the morning and at bedtime.    . ondansetron (ZOFRAN) 4 MG tablet Take 1 tablet (4 mg total) by mouth daily as needed for nausea or vomiting. 30 tablet 1  . simvastatin (ZOCOR) 20 MG tablet Take 1 tablet (20 mg total) by mouth every evening. 30 tablet 2  . tamsulosin (FLOMAX) 0.4 MG CAPS capsule Take 0.4 mg by mouth in the morning and at bedtime.    Marland Kitchen zinc gluconate 50 MG tablet Take 50 mg by mouth daily.     No current facility-administered medications for this visit.   Allergies:  Lipitor [atorvastatin], Voltaren [diclofenac sodium], and Penicillins   Social History: The patient   reports that he quit smoking about 37 years ago. His smoking use included cigarettes. He started smoking about 63 years ago. He has a 42.00 pack-year smoking history. He quit smokeless tobacco use about 8 years ago.  His smokeless tobacco use included chew. He reports that he does not drink alcohol and does not use drugs.   Family History: The patient's family history includes Diabetes in his mother; Stroke in his mother.   ROS:  Please see the history of present illness. Otherwise, complete review of systems is positive for none.  All other systems are reviewed and negative.   Physical Exam: VS:  BP 138/60   Pulse 62   Ht 6\' 6"  (1.981 m)   Wt 234 lb 12.8 oz (106.5 kg)   SpO2 96%   BMI 27.13 kg/m , BMI Body mass index is 27.13 kg/m.  Wt Readings from Last 3 Encounters:  12/17/20 234 lb 12.8 oz (106.5 kg)  12/06/20 234 lb (106.1 kg)  11/30/20 244 lb 0.8 oz (110.7 kg)    General: Patient appears comfortable at rest. Neck: Supple, no elevated JVP or carotid bruits, no thyromegaly. Lungs: Clear to auscultation, nonlabored breathing at rest. Cardiac: Regular rate and rhythm, no S3 or significant systolic murmur, no pericardial rub. Extremities: No pitting edema, distal pulses 2+. Skin: Warm and dry.  Median sternotomy scar, chest tube drainage incisions clean and dry without redness, swelling, edema.  Having some discomfort at his SVT harvesting site and some swelling above the area.  Radial artery catheterization site clean and dry with 2+ pulse. Musculoskeletal: No kyphosis. Neuropsychiatric: Alert and oriented x3, affect grossly appropriate.  ECG:  An ECG dated 12/17/2020 was personally reviewed today and demonstrated:  Sinus rhythm with first-degree AV block rate of 67, possible lateral infarct, age undetermined.  Recent Labwork: 11/27/2020: Magnesium 2.2 11/29/2020: ALT 16; AST 23; Hemoglobin 9.2; Platelets 167 12/01/2020: BUN 20; Creatinine, Ser 1.29; Potassium 3.4; Sodium 135      Component Value Date/Time   CHOL 105 11/22/2020 0140   TRIG 131 11/22/2020 0140   HDL 25 (L) 11/22/2020 0140   CHOLHDL 4.2 11/22/2020 0140   VLDL 26 11/22/2020 0140   LDLCALC 54 11/22/2020 0140    Other Studies Reviewed Today:  CABG x 3 Dr Orvan Seen 11/26/2020   Coronary Artery Bypass Grafting x 3  Left Internal Mammary Artery  to Distal Left Anterior Descending Coronary Artery, Saphenous Vein Graft to Posterior Descending Coronary Artery, Saphenous Vein Graft to Obtuse Marginal Branch of Left Circumflex Coronary Artery, Endoscopic Vein Harvest from right thigh and Lower Leg    Caroid doppler study 11/23/2020 Right Carotid: The extracranial vessels were near-normal with only minimal wall thickening or plaque. Left Carotid: The extracranial vessels were near-normal with only minimal wall thickening or plaque.   11/22/2020 LEFT HEART CATH AND CORONARY ANGIOGRAPHY    Conclusion    LV end diastolic pressure is normal.  There is no aortic valve stenosis.  Prox RCA to Mid RCA lesion is 10% stenosed.  Mid RCA lesion is 70% stenosed.  Dist RCA lesion is 60% stenosed.  Mid LM to Ost LAD lesion is 60% stenosed with 99% stenosed side branch in Ost Cx.  Ost LAD to Mid LAD lesion is 45% stenosed.   SUMMARY  Severe multivessel CAD:  Distal left main 55-60% involving 99% ostial LCx and ostial high OM1/RI; LCx otherwise has mild proximal and mid disease terminating as major branching lateral a OM branch  40% proximal LAD with relatively minimal disease throughout the remainder the LAD and major 1st Diag  Tandem mid and distal RCA lesions of 70% and 60%  Normal EDP.  Normal EF by echo   RECOMMENDATIONS  Return to nursing unit for ongoing post-cath care.    Restart IV heparin 8 hours post sheath removal.  Patient has surgical disease with the distal left main and ostial LCx.  Will consult CVTS in the morning. ? PCI options would be high risk given the sharp angle  takeoff of the circumflex, calcified ostial lesion with a sidebranch (high OM/RI).  Would need to stent across the LCx into the LAD as well (DK crush versus DK culotte technique would be required)  Continue aggressive risk modification.       Echocardiogram 11/22/2020  1. Left ventricular ejection fraction, by estimation, is 55 to 60%. The left ventricle has normal function. The left ventricle has no regional wall motion abnormalities. Left ventricular diastolic parameters are consistent with Grade I diastolic dysfunction (impaired relaxation). The average left ventricular global longitudinal strain is 20.1 %. The global longitudinal strain is normal. 2. Right ventricular systolic function is normal. The right ventricular size is normal. There is normal pulmonary artery systolic pressure. The estimated right ventricular systolic pressure is 33.2 mmHg. 3. The mitral valve is normal in structure. No evidence of mitral valve regurgitation. No evidence of mitral stenosis. 4. The aortic valve is tricuspid. Aortic valve regurgitation is not visualized. No aortic stenosis is present. 5. Aortic dilatation noted. There is mild dilatation of the ascending aorta, measuring 37 mm. 6. The inferior vena cava is normal in size with greater than 50% respiratory variability, suggesting right atrial pressure of 3 mmHg.    Stress test 05/11/18:   Blood pressure demonstrated a hypertensive response to exercise.  There was no ST segment deviation noted during stress.  Defect 1: There is a medium defect of moderate severity present in the mid inferoseptal, mid inferior and apical inferior location. This is consistent with myocardial scar as well as overlying soft tissue attenuation. There are no significant ischemic territories.  This is a low risk study.  Nuclear stress EF: 55%.   Assessment and Plan:  1. S/P CABG x 3   2. Coronary artery disease involving native coronary artery of  native heart without angina pectoris   3. Essential hypertension   4. Mixed  hyperlipidemia    1. S/P CABG x 3 Status post coronary artery bypass grafting x3 with LIMA to the distal LAD, SVG to PDA, SVG to obtuse marginal branch of left circumflex, endoscopic vein harvesting from right thigh and lower leg by Dr. Orvan Seen on 11/26/2020.  Complaining of some discomfort and the area of SVG graft and swelling up above the site.  Also complaining of some left buttock/hip pain.  He is scheduled to see Dr. Orvan Seen on May 12 at 9:30 AM.  Advised him to bring this to Dr. Orvan Seen attention.  Continue aspirin 81 mg daily.  2. Coronary artery disease involving native coronary artery of native heart without angina pectoris Left heart cath on 11/23/2018 demonstrated severe multivessel CAD with distal left main 55 to 60% involving 99% ostial LCx and ostial high OM1/RI;, LCx otherwise had mild proximal and mid disease terminating as a major branching lateral OM branch.  40% proximal LAD with relatively minimal disease throughout the remainder of the LAD and major first diagonal.  Tandem mid and distal RCA lesions of 70% and 60%.  Continue aspirin 81 mg daily.  3. Essential hypertension Blood pressure slightly elevated 138/60.  Continue enalapril 2.5 mg p.o. daily.  Continue metoprolol 50 mg p.o. twice daily.  4. Mixed hyperlipidemia Continue simvastatin 20 mg p.o. daily.  Recent lipid panel 11/22/2020 total cholesterol 105, HDL 25, LDL 54, triglycerides 131, VLDL 26.  5.  Atrial fibrillation Patient had postop atrial fibrillation.  EKG today shows sinus rhythm with first-degree AV block, possible lateral infarct, age undetermined, heart rate of 67.  Continue Eliquis 5 mg p.o. twice daily.  Continue metoprolol 50 mg p.o. twice daily.  Medication Adjustments/Labs and Tests Ordered: Current medicines are reviewed at length with the patient today.  Concerns regarding medicines are outlined above.   Disposition: Follow-up  with Dr. Harl Bowie or APP 6 months.  Signed, Levell July, NP 12/17/2020 11:27 AM    [facility] [place] [facility] [place], [place], [place] [address] Phone: [phone]; Fax: [phone]

## 2020-12-17 ENCOUNTER — Encounter: Payer: Self-pay | Admitting: Family Medicine

## 2020-12-17 ENCOUNTER — Ambulatory Visit: Payer: Medicare PPO | Admitting: Family Medicine

## 2020-12-17 VITALS — BP 138/60 | HR 62 | Ht 78.0 in | Wt 234.8 lb

## 2020-12-17 DIAGNOSIS — I2510 Atherosclerotic heart disease of native coronary artery without angina pectoris: Secondary | ICD-10-CM | POA: Diagnosis not present

## 2020-12-17 DIAGNOSIS — Z951 Presence of aortocoronary bypass graft: Secondary | ICD-10-CM | POA: Diagnosis not present

## 2020-12-17 DIAGNOSIS — I10 Essential (primary) hypertension: Secondary | ICD-10-CM

## 2020-12-17 DIAGNOSIS — E782 Mixed hyperlipidemia: Secondary | ICD-10-CM | POA: Diagnosis not present

## 2020-12-17 NOTE — Patient Instructions (Signed)
Medication Instructions:  Continue all current medications.   Labwork: none  Testing/Procedures: none  Follow-Up: 6 months   Any Other Special Instructions Will Be Listed Below (If Applicable).   If you need a refill on your cardiac medications before your next appointment, please call your pharmacy.  

## 2020-12-18 ENCOUNTER — Other Ambulatory Visit: Payer: Self-pay

## 2020-12-18 ENCOUNTER — Ambulatory Visit: Payer: Self-pay | Admitting: Cardiothoracic Surgery

## 2020-12-18 VITALS — BP 116/75 | HR 71 | Temp 97.2°F | Resp 20 | Wt 233.8 lb

## 2020-12-18 DIAGNOSIS — Z5189 Encounter for other specified aftercare: Secondary | ICD-10-CM | POA: Insufficient documentation

## 2020-12-18 DIAGNOSIS — Z951 Presence of aortocoronary bypass graft: Secondary | ICD-10-CM

## 2020-12-18 NOTE — Progress Notes (Signed)
The patient returns for his second postoperative visit status post CABG.  He has no complaints of chest pain or shortness of breath.  He has mild irritation at the level of the right thigh saphenous vein graft harvest site.    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 Orvan Seen, [place]

## 2020-12-20 ENCOUNTER — Ambulatory Visit: Payer: Medicare PPO | Admitting: Cardiothoracic Surgery

## 2020-12-22 ENCOUNTER — Encounter (HOSPITAL_COMMUNITY): Payer: Self-pay | Admitting: Emergency Medicine

## 2020-12-22 ENCOUNTER — Emergency Department (HOSPITAL_COMMUNITY)
Admission: EM | Admit: 2020-12-22 | Discharge: 2020-12-22 | Disposition: A | Discharge status: Home / Self Care | Payer: Medicare PPO | Attending: Emergency Medicine | Admitting: Emergency Medicine

## 2020-12-22 ENCOUNTER — Other Ambulatory Visit: Payer: Self-pay

## 2020-12-22 ENCOUNTER — Telehealth (HOSPITAL_COMMUNITY): Payer: Self-pay | Admitting: Student

## 2020-12-22 ENCOUNTER — Emergency Department (HOSPITAL_COMMUNITY): Payer: Medicare PPO

## 2020-12-22 DIAGNOSIS — M25552 Pain in left hip: Secondary | ICD-10-CM | POA: Insufficient documentation

## 2020-12-22 DIAGNOSIS — M5432 Sciatica, left side: Secondary | ICD-10-CM

## 2020-12-22 DIAGNOSIS — N1831 Chronic kidney disease, stage 3a: Secondary | ICD-10-CM | POA: Insufficient documentation

## 2020-12-22 DIAGNOSIS — Z87891 Personal history of nicotine dependence: Secondary | ICD-10-CM | POA: Diagnosis not present

## 2020-12-22 DIAGNOSIS — M5442 Lumbago with sciatica, left side: Secondary | ICD-10-CM | POA: Diagnosis not present

## 2020-12-22 DIAGNOSIS — E1122 Type 2 diabetes mellitus with diabetic chronic kidney disease: Secondary | ICD-10-CM | POA: Insufficient documentation

## 2020-12-22 DIAGNOSIS — Z7901 Long term (current) use of anticoagulants: Secondary | ICD-10-CM | POA: Diagnosis not present

## 2020-12-22 DIAGNOSIS — Z951 Presence of aortocoronary bypass graft: Secondary | ICD-10-CM | POA: Diagnosis not present

## 2020-12-22 DIAGNOSIS — Z79899 Other long term (current) drug therapy: Secondary | ICD-10-CM | POA: Insufficient documentation

## 2020-12-22 DIAGNOSIS — Z7982 Long term (current) use of aspirin: Secondary | ICD-10-CM | POA: Diagnosis not present

## 2020-12-22 DIAGNOSIS — Z7984 Long term (current) use of oral hypoglycemic drugs: Secondary | ICD-10-CM | POA: Insufficient documentation

## 2020-12-22 DIAGNOSIS — I2510 Atherosclerotic heart disease of native coronary artery without angina pectoris: Secondary | ICD-10-CM | POA: Insufficient documentation

## 2020-12-22 DIAGNOSIS — I129 Hypertensive chronic kidney disease with stage 1 through stage 4 chronic kidney disease, or unspecified chronic kidney disease: Secondary | ICD-10-CM | POA: Insufficient documentation

## 2020-12-22 DIAGNOSIS — M545 Low back pain, unspecified: Secondary | ICD-10-CM | POA: Diagnosis present

## 2020-12-22 MED ORDER — OXYCODONE HCL 5 MG PO TABS
5.0000 mg | ORAL_TABLET | Freq: Once | ORAL | Status: AC
  Administered 2020-12-22: 5 mg via ORAL
  Filled 2020-12-22: qty 1

## 2020-12-22 MED ORDER — ACETAMINOPHEN 500 MG PO TABS
1000.0000 mg | ORAL_TABLET | Freq: Once | ORAL | Status: AC
  Administered 2020-12-22: 1000 mg via ORAL
  Filled 2020-12-22: qty 2

## 2020-12-22 MED ORDER — DICLOFENAC SODIUM 1 % EX GEL: 4.0000 g | Freq: Four times a day (QID) | CUTANEOUS | 0 refills | Status: DC

## 2020-12-22 MED ORDER — DICLOFENAC SODIUM 1 % EX GEL: CUTANEOUS | 0 refills | Status: DC

## 2020-12-22 NOTE — ED Provider Notes (Signed)
[facility] EMERGENCY DEPARTMENT Provider Note   CSN: [id number] Arrival date & time: 12/22/20  9518     History Chief Complaint  Patient presents with  . Leg Pain    Craig Taylor is a 80 y.o. male.  80 yo M with a chief complaints of left-sided hip pain that radiates down the leg.  This is been going on since he had his open heart surgery done.  Has been home about a week afterwards and feels like the pain is gotten worse.  Usually notices it most the time when he is trying to sleep at night but does feel it throughout the day when he is up and moving.  It is worse with ambulation and palpation.  He denies any trauma to the area.  His graft site was from the right leg.  He denies any loss of bowel or bladder denies loss of peritoneal sensation denies numbness or weakness to the leg.  Denies recent surgery to his back.  The history is provided by the patient.  Leg Pain Location:  Leg Time since incident:  2 weeks Injury: yes   Leg location:  L leg Pain details:    Quality:  Aching and shooting   Radiates to:  L leg   Severity:  Moderate   Onset quality:  Gradual   Duration:  2 weeks   Timing:  Constant   Progression:  Worsening Chronicity:  New Prior injury to area:  No Relieved by:  Nothing Worsened by:  Nothing Ineffective treatments:  None tried Associated symptoms: back pain   Associated symptoms: no fever        Past Medical History:  Diagnosis Date  . Benign localized prostatic hyperplasia with lower urinary tract symptoms (LUTS)   . Bladder cancer [facility])    urologist--- dr winter---  s/p  TURBT 09/ 2020  . CKD (chronic kidney disease), stage III ([place])    neprologist--- dr Lowanda Foster  (12-15-2019  per pt was released to pcp at [facility] 02-06-2018 scanned in epic under media  . Coronary artery disease cardiologist--- dr Bronson Ing   hx MI in 2003--- 08-02-2002  s/p cardiac cath with PCI and DES to [facility] and PDA;  cath 10-04-2002 patent stents w/ 30-40% AV groove Cx  with ef 50% and mild inferior hypokinesis  . COVID-19 12/16/2019   asymptomatic  . Full dentures   . Glaucoma, right eye   . History of basal cell carcinoma (BCC) excision   . History of gout    per pt one episode 01/ 2021   . History of kidney stones   . History of MI (myocardial infarction) 08/01/2002  . History of ulcer of large intestine    per pt approx. 2002 told due to oral voltaren , colonoscopy/ egd with no intervention,  but did have blood transfusions  . Hyperlipidemia   . Hypertension    followed by pcp   (nuclear stress test in epic 05-11-2018  low risk w/ no ischemia, nuclear ef 55%)  . IDA (iron deficiency anemia)   . Nephrolithiasis   . Non-insulin dependent type 2 diabetes mellitus ([place])    followed by pcp  (12-15-2019   per pt checks blood sugar 3 times a week,  fasting sugar-- 120-140)  . S/P drug eluting coronary stent placement 08/02/2002   to proximal RCA and PDA  . Wears glasses     Patient Active Problem List   Diagnosis Date Noted  . Visit for wound check 12/18/2020  .  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 Stage 3a chronic kidney disease ([place])   . Unstable angina ([place]) 11/21/2020  . IDA (iron deficiency anemia) 05/24/2018  . B12 deficiency 05/24/2018  . Constipation 05/24/2018  . Urinary tract infectious disease 06/15/2015  . UTI (lower urinary tract infection) 06/15/2015  . Essential hypertension 08/08/2013  . Hyperlipidemia 08/08/2013  . Type 2 diabetes mellitus ([place]) 08/08/2013  . Coronary artery disease 08/08/2013  . Pain in joint, lower leg 11/11/2011  . Stiffness of joint, not elsewhere classified, lower leg 11/11/2011    Past Surgical History:  Procedure Laterality Date  . BIOPSY  07/13/2018   Procedure: BIOPSY;  Surgeon: Daneil Dolin, MD;  Location[facility];  Service: Endoscopy;;  (gastric)  . CARDIAC CATHETERIZATION  08/02/2002   dr berry   Crux of RCA 95% stenosis, stented with a 2.5x59mm CYPHER stent resulting in reduction of 95%  stenosis to 0% residual; overlapping CYPHER stents -3x52mm and 3x7mm- deployed at 15atm resulting in reduction of 70% stenosis to 0% residual.  . CARDIAC CATHETERIZATION  10/04/2002    dr berry   patent stents w/ 30-40% AV groove LCx , mild inferior hypokinesis, ef 50%  . COLONOSCOPY N/A 05/31/2014   Dr. Gala Romney: Normal  . COLONOSCOPY N/A 07/13/2018   Procedure: COLONOSCOPY;  Surgeon: Daneil Dolin, MD;  Location: [facility];  Service: Endoscopy;  Laterality: N/A;  8:30am  . CORONARY ARTERY BYPASS GRAFT N/A 11/26/2020   Procedure: CORONARY ARTERY BYPASS GRAFTING (CABG) TIMES 3 ON PUMP USING LEFT INTERNAL MAMMARY ARTERY AND ENDOSCOPICALLY HARVESTED RIGHT GREATER SAPHENOUS VEIN;  Surgeon: Wonda Olds, MD;  Location: [place];  Service: Open Heart Surgery;  Laterality: N/A;  . ENDOVEIN HARVEST OF GREATER SAPHENOUS VEIN Right 11/26/2020   Procedure: ENDOVEIN HARVEST OF GREATER SAPHENOUS VEIN;  Surgeon: Wonda Olds, MD;  Location: [place];  Service: Open Heart Surgery;  Laterality: Right;  . ESOPHAGOGASTRODUODENOSCOPY N/A 07/13/2018   Procedure: ESOPHAGOGASTRODUODENOSCOPY (EGD);  Surgeon: Daneil Dolin, MD;  [facility];  Service: Endoscopy;  Laterality: N/A;  . LEFT HEART CATH AND CORONARY ANGIOGRAPHY N/A 11/22/2020   Procedure: LEFT HEART CATH AND CORONARY ANGIOGRAPHY;  Surgeon: Leonie Man, MD;  Location: [place] [facility];  Service: Cardiovascular;  Laterality: N/A;  . TEE WITHOUT CARDIOVERSION N/A 11/26/2020   Procedure: TRANSESOPHAGEAL ECHOCARDIOGRAM (TEE);  Surgeon: Wonda Olds, MD;  Location: [place];  Service: Open Heart Surgery;  Laterality: N/A;  . TRANSURETHRAL RESECTION OF BLADDER TUMOR N/A 04/22/2019   Procedure: TRANSURETHRAL RESECTION OF BLADDER TUMOR (TURBT)/ CYSTOSCOPY BILATERAL RETROGRADE PYELOGRAMS,  GEMCITABINE INSTILLATION;  Surgeon: Ceasar Mons, MD;  Location[facility];  Service: Urology;  Laterality: N/A;  ONLY NEEDS 45  MIN  . TRANSURETHRAL RESECTION OF BLADDER TUMOR N/A 01/25/2020   Procedure: TRANSURETHRAL RESECTION OF BLADDER TUMOR (TURBT)/ CYSTOSCOPY;  Surgeon: Ceasar Mons, MD;  Location: [facility];  Service: Urology;  Laterality: N/A;  ONLY NEEDS 30 MIN       Family History  Problem Relation Age of Onset  . Stroke Mother   . Diabetes Mother   . Colon cancer Neg Hx     Social History   Tobacco Use  . Smoking status: Former Smoker    Packs/day: 2.00    Years: 21.00    Pack years: 42.00    Types: Cigarettes    Start date: 07/04/1957    Quit date: 08/09/1983    Years since  quitting: 37.3  . Smokeless tobacco: Former Systems developer    Types: Chew    Quit date: 08/11/2012  Vaping Use  . Vaping Use: Never used  Substance Use Topics  . Alcohol use: No    Alcohol/week: 0.0 standard drinks  . Drug use: Never    Home Medications Prior to Admission medications   Medication Sig Start Date End Date Taking? Authorizing Provider  diclofenac Sodium (VOLTAREN) 1 % GEL Apply 4 g topically 4 (four) times daily. 12/22/20  Yes Deno Etienne, DO  apixaban (ELIQUIS) 5 MG TABS tablet Take 1 tablet (5 mg total) by mouth 2 (two) times daily. 12/01/20   Antony Odea, PA-C  aspirin EC 81 MG tablet Take 81 mg by mouth daily.    [provider]  Cholecalciferol (VITAMIN D-3) 125 MCG (5000 UT) TABS Take 1 tablet by mouth at bedtime.     [provider]  Cinnamon 500 MG TABS Take 1 tablet by mouth 2 (two) times daily.     [provider]  dorzolamide-timolol (COSOPT) 22.3-6.8 MG/ML ophthalmic solution Place 1 drop into the right eye in the morning and at bedtime. 08/14/20 08/14/21  [provider]  enalapril (VASOTEC) 2.5 MG tablet Take 2.5 mg by mouth daily.     [provider]  metFORMIN (GLUCOPHAGE) 500 MG tablet Take 1,000 mg by mouth 2 (two) times daily with a meal.    [provider]  metoprolol tartrate (LOPRESSOR) 50 MG tablet Take 1  tablet (50 mg total) by mouth 2 (two) times daily. 12/01/20   Antony Odea, PA-C  Omega-3 Fatty Acids (FISH OIL PO) Take 1 tablet by mouth in the morning and at bedtime.    [provider]  ondansetron (ZOFRAN) 4 MG tablet Take 1 tablet (4 mg total) by mouth daily as needed for nausea or vomiting. 12/06/20 12/06/21  Wonda Olds, MD  simvastatin (ZOCOR) 20 MG tablet Take 1 tablet (20 mg total) by mouth every evening. 12/01/20   Antony Odea, PA-C  tamsulosin (FLOMAX) 0.4 MG CAPS capsule Take 0.4 mg by mouth in the morning and at bedtime. 07/22/19   [provider]  zinc gluconate 50 MG tablet Take 50 mg by mouth daily.    [provider]    Allergies    Lipitor [atorvastatin], Voltaren [diclofenac sodium], and Penicillins  Review of Systems   Review of Systems  Constitutional: Negative for chills and fever.  HENT: Negative for congestion and facial swelling.   Eyes: Negative for discharge and visual disturbance.  Respiratory: Negative for shortness of breath.   Cardiovascular: Negative for chest pain and palpitations.  Gastrointestinal: Negative for abdominal pain, diarrhea and vomiting.  Musculoskeletal: Positive for arthralgias and back pain. Negative for myalgias.  Skin: Negative for color change and rash.  Neurological: Negative for tremors, syncope and headaches.  Psychiatric/Behavioral: Negative for confusion and dysphoric mood.    Physical Exam Updated Vital Signs BP 138/85   Pulse 70   Temp 98 F (36.7 C) (Oral)   Resp 18   Ht 6\' 6"  (1.981 m)   Wt 106 kg   SpO2 96%   BMI 27.01 kg/m   Physical Exam Vitals and nursing note reviewed.  Constitutional:      Appearance: He is well-developed.  HENT:     Head: Normocephalic and atraumatic.  Eyes:     Pupils: Pupils are equal, round, and reactive to light.  Neck:     Vascular: No JVD.  Cardiovascular:     Rate and Rhythm: Normal rate and regular rhythm.     Heart sounds: No  murmur heard. No friction rub. No gallop.   Pulmonary:     Effort: No respiratory distress.     Breath sounds: No wheezing.  Abdominal:     General: There is no distension.     Tenderness: There is no guarding or rebound.  Musculoskeletal:        General: Normal range of motion.     Cervical back: Normal range of motion and neck supple.     Comments: Pain to the SI joint area on the left.  No pain with internal and external rotation of the hip.  Able to range the hip without significant pain.  Pulse motor and sensation intact distally.  Skin:    Coloration: Skin is not pale.     Findings: No rash.  Neurological:     Mental Status: He is alert and oriented to person, place, and time.  Psychiatric:        Behavior: Behavior normal.     ED Results / Procedures / Treatments   Labs (all labs ordered are listed, but only abnormal results are displayed) Labs Reviewed - No data to display  EKG None  Radiology DG Lumbar Spine Complete  Result Date: 12/22/2020 CLINICAL DATA:  Low back pain EXAM: LUMBAR SPINE - COMPLETE 4+ VIEW COMPARISON:  CT [facility] 06/18/2018 FINDINGS: The alignment appears normal. Chronic vertebral endplate depressions are again noted at L3 through L5. Multi level endplate spurring identified. No fracture or dislocation. Aortic atherosclerotic calcifications. IMPRESSION: 1. No acute findings. 2. Chronic vertebral endplate depressions from L3 through L5. 3. Degenerative disc disease 4.  Aortic Atherosclerosis (ICD10-I70.0). Electronically Signed   By: Kerby Moors M.D.   On: 12/22/2020 05:10    Procedures Procedures   Medications Ordered in ED Medications  acetaminophen (TYLENOL) tablet 1,000 mg (1,000 mg Oral Given 12/22/20 0427)  oxyCODONE (Oxy IR/ROXICODONE) immediate release tablet 5 mg (5 mg Oral Given 12/22/20 0427)    ED Course  I have reviewed the triage vital signs and the nursing notes.  Pertinent labs & imaging results that were available during my care  of the patient were reviewed by me and considered in my medical decision making (see chart for details).    MDM Rules/Calculators/A&P                          80 yo M with a chief complaints of left-sided low back pain that radiates down the leg.  Patient denies history of this in the past.  Started while he was in the hospital with his open heart surgery.  He is mostly concerned about a DVT though clinically does not seem to be the most likely diagnosis.  Will obtain a plain film of the L-spine.  Plain film without acute finding.  Will discharge the patient home.  PCP follow-up.  5:59 AM:  I have discussed the diagnosis/risks/treatment options with the patient and believe the pt to be eligible for discharge home to follow-up with PCP. We also discussed returning to the ED immediately if new or worsening sx occur. We discussed the sx which are most concerning (e.g., sudden worsening pain, fever, inability to tolerate by mouth) that necessitate immediate return. Medications administered to the patient during their visit and any new prescriptions provided to the patient are listed below.  Medications given during this visit Medications  acetaminophen (TYLENOL) tablet 1,000 mg (1,000 mg Oral Given 12/22/20 0427)  oxyCODONE (Oxy IR/ROXICODONE) immediate release tablet 5 mg (5 mg Oral Given 12/22/20 0427)     The patient appears reasonably screen and/or stabilized for discharge and I doubt any other medical condition or other [facility] requiring further screening, evaluation, or treatment in the ED at this time prior to discharge.    Final Clinical Impression(s) / ED Diagnoses Final diagnoses:  Sciatica of left side    Rx / DC Orders ED Discharge Orders         Ordered    diclofenac Sodium (VOLTAREN) 1 % GEL  4 times daily        12/22/20 0514    US Venous Img Lower Unilateral Left        12/22/20 Morton, [place], DO 12/22/20 0559

## 2020-12-22 NOTE — ED Triage Notes (Signed)
Pt with c/o left leg pain that started after he was discharged from hospital last Saturday. States pain is from hip and travels down to his ankle and only happens at night when he is sleeping.

## 2020-12-22 NOTE — Discharge Instructions (Signed)
Your back pain is most likely due to a muscular strain.  There is been a lot of research on back pain, unfortunately the only thing that seems to really help is Tylenol and ibuprofen.  Relative rest is also important to not lift greater than 10 pounds bending or twisting at the waist.  Please follow-up with your family physician.  The other thing that really seems to benefit patients is physical therapy which your doctor may send you for.  Please return to the emergency department for new numbness or weakness to your arms or legs. Difficulty with urinating or urinating or pooping on yourself.  Also if you cannot feel toilet paper when you wipe or get a fever.   Use the gel as prescribed Also take tylenol 1000mg (2 extra strength) four times a day.   I did put an order in for you to get a DVT study this morning if you would like to come in and have it performed

## 2021-01-10 ENCOUNTER — Other Ambulatory Visit: Payer: Self-pay

## 2021-01-10 ENCOUNTER — Encounter (HOSPITAL_COMMUNITY)
Admission: RE | Admit: 2021-01-10 | Discharge: 2021-01-10 | Disposition: A | Discharge status: Home / Self Care | Payer: Medicare PPO | Source: Ambulatory Visit | Attending: Cardiology | Admitting: Cardiology

## 2021-01-10 VITALS — BP 110/62 | HR 62 | Wt 226.4 lb

## 2021-01-10 DIAGNOSIS — Z951 Presence of aortocoronary bypass graft: Secondary | ICD-10-CM | POA: Insufficient documentation

## 2021-01-10 NOTE — Progress Notes (Signed)
Cardiac Individual Treatment Plan  Patient Details  Name: Craig Taylor MRN: [id number] Date of Birth: 03-19-1941 Referring Provider:   Flowsheet Row CARDIAC REHAB PHASE II ORIENTATION from 01/10/2021 in [facility]  Referring Provider Dr. Harl Bowie      Initial Encounter Date:  Flowsheet Row CARDIAC REHAB PHASE II ORIENTATION from 01/10/2021 in [facility]  Date 01/10/21      Visit Diagnosis: S/P CABG x 3  Patient's Home Medications on Admission:  Current Outpatient Medications:  .  apixaban (ELIQUIS) 5 MG TABS tablet, Take 1 tablet (5 mg total) by mouth 2 (two) times daily., Disp: 60 tablet, Rfl: 1 .  aspirin EC 81 MG tablet, Take 81 mg by mouth daily., Disp: , Rfl:  .  Cholecalciferol (VITAMIN D-3) 125 MCG (5000 UT) TABS, Take 1 tablet by mouth at bedtime. , Disp: , Rfl:  .  Cinnamon 500 MG TABS, Take 1 tablet by mouth 2 (two) times daily. , Disp: , Rfl:  .  diclofenac Sodium (VOLTAREN) 1 % GEL, Apply small amount to affected area, Disp: 2 g, Rfl: 0 .  dorzolamide-timolol (COSOPT) 22.3-6.8 MG/ML ophthalmic solution, Place 1 drop into the right eye in the morning and at bedtime., Disp: , Rfl:  .  enalapril (VASOTEC) 2.5 MG tablet, Take 2.5 mg by mouth daily. , Disp: , Rfl:  .  metFORMIN (GLUCOPHAGE) 500 MG tablet, Take 1,000 mg by mouth 2 (two) times daily with a meal., Disp: , Rfl:  .  metoprolol tartrate (LOPRESSOR) 50 MG tablet, Take 1 tablet (50 mg total) by mouth 2 (two) times daily., Disp: 60 tablet, Rfl: 2 .  Omega-3 Fatty Acids (FISH OIL PO), Take 1 tablet by mouth in the morning and at bedtime., Disp: , Rfl:  .  ondansetron (ZOFRAN) 4 MG tablet, Take 1 tablet (4 mg total) by mouth daily as needed for nausea or vomiting., Disp: 30 tablet, Rfl: 1 .  simvastatin (ZOCOR) 20 MG tablet, Take 1 tablet (20 mg total) by mouth every evening., Disp: 30 tablet, Rfl: 2 .  tamsulosin (FLOMAX) 0.4 MG CAPS capsule, Take 0.4 mg by mouth in the  morning and at bedtime., Disp: , Rfl:  .  zinc gluconate 50 MG tablet, Take 50 mg by mouth daily., Disp: , Rfl:   Past Medical History: Past Medical History:  Diagnosis Date  . Benign localized prostatic hyperplasia with lower urinary tract symptoms (LUTS)   . Bladder cancer [facility])    urologist--- dr winter---  s/p  TURBT 09/ 2020  . CKD (chronic kidney disease), stage III ([place])    neprologist--- dr Lowanda Foster  (12-15-2019  per pt was released to pcp at [facility] 02-06-2018 scanned in epic under media  . Coronary artery disease cardiologist--- dr Bronson Ing   hx MI in 2003--- 08-02-2002  s/p cardiac cath with PCI and DES to [facility] and PDA;  cath 10-04-2002 patent stents w/ 30-40% AV groove Cx with ef 50% and mild inferior hypokinesis  . COVID-19 12/16/2019   asymptomatic  . Full dentures   . Glaucoma, right eye   . History of basal cell carcinoma (BCC) excision   . History of gout    per pt one episode 01/ 2021   . History of kidney stones   . History of MI (myocardial infarction) 08/01/2002  . History of ulcer of large intestine    per pt approx. 2002 told due to oral voltaren , colonoscopy/ egd with no intervention,  but did have  blood transfusions  . Hyperlipidemia   . Hypertension    followed by pcp   (nuclear stress test in epic 05-11-2018  low risk w/ no ischemia, nuclear ef 55%)  . IDA (iron deficiency anemia)   . Nephrolithiasis   . Non-insulin dependent type 2 diabetes mellitus ([place])    followed by pcp  (12-15-2019   per pt checks blood sugar 3 times a week,  fasting sugar-- 120-140)  . S/P drug eluting coronary stent placement 08/02/2002   to proximal RCA and PDA  . Wears glasses     Tobacco Use: Social History   Tobacco Use  Smoking Status Former Smoker  . Packs/day: 2.00  . Years: 21.00  . Pack years: 42.00  . Types: Cigarettes  . Start date: 07/04/1957  . Quit date: 08/09/1983  . Years since quitting: 37.4  Smokeless Tobacco Former Systems developer  . Types: Chew  . Quit date:  08/11/2012    Labs: Recent Review Flowsheet Data    Labs for ITP Cardiac and Pulmonary Rehab Latest Ref Rng & Units 11/26/2020 11/26/2020 11/26/2020 11/26/2020 11/26/2020   Cholestrol 0 - 200 mg/dL - - - - -   LDLCALC 0 - 99 mg/dL - - - - -   HDL >40 mg/dL - - - - -   Trlycerides <150 mg/dL - - - - -   Hemoglobin A1c 4.8 - 5.6 % - - - - -   PHART 7.350 - 7.450 7.324(L) 7.374 7.324(L) 7.296(L) 7.295(L)   PCO2ART 32.0 - 48.0 mmHg 38.8 39.6 42.8 48.2(H) 45.8   HCO3 20.0 - 28.0 mmol/L 20.5 23.4 22.3 23.5 22.2   TCO2 22 - 32 mmol/L 22 25 24 25 24    ACIDBASEDEF 0.0 - 2.0 mmol/L 5.0(H) 2.0 4.0(H) 3.0(H) 4.0(H)   O2SAT % 92.0 99.0 97.0 93.0 95.0      Capillary Blood Glucose: Lab Results  Component Value Date   GLUCAP 151 (H) 12/01/2020   GLUCAP 139 (H) 12/01/2020   GLUCAP 129 (H) 11/30/2020   GLUCAP 141 (H) 11/30/2020   GLUCAP 196 (H) 11/30/2020    POCT Glucose    Row Name 01/10/21 0853             POCT Blood Glucose   Pre-Exercise 129 mg/dL              Exercise Target Goals: Exercise Program Goal: Individual exercise prescription set using results from initial 6 min walk test and THRR while considering  patient's activity barriers and safety.   Exercise Prescription Goal: Starting with aerobic activity 30 plus minutes a day, 3 days per week for initial exercise prescription. Provide home exercise prescription and guidelines that participant acknowledges understanding prior to discharge.  Activity Barriers & Risk Stratification:  Activity Barriers & Cardiac Risk Stratification - 01/10/21 0814      Activity Barriers & Cardiac Risk Stratification   Activity Barriers Arthritis;Neck/Spine Problems;Deconditioning;Incisional Pain    Cardiac Risk Stratification High           6 Minute Walk:  6 Minute Walk    Row Name 01/10/21 1015         6 Minute Walk   Phase Initial     Distance 1100 feet     Walk Time 6 minutes     # of Rest Breaks 0     MPH 2.08     METS 2.25      RPE 10     VO2 Peak 7.87     Symptoms  Yes (comment)     Comments left hip pain 4/10     Resting HR 62 bpm     Resting BP 110/62     Resting Oxygen Saturation  98 %     Exercise Oxygen Saturation  during 6 min walk 97 %     Max Ex. HR 88 bpm     Max Ex. BP 130/52     2 Minute Post BP 100/58            Oxygen Initial Assessment:   Oxygen Re-Evaluation:   Oxygen Discharge (Final Oxygen Re-Evaluation):   Initial Exercise Prescription:  Initial Exercise Prescription - 01/10/21 1000      Date of Initial Exercise RX and Referring Provider   Date 01/10/21    Referring Provider Dr. Harl Bowie    Expected Discharge Date 04/05/21      NuStep   Level 1    SPM 80    Minutes 22      Elliptical   Level 1    Speed 50    Minutes 17      Prescription Details   Frequency (times per week) 2    Duration Progress to 30 minutes of continuous aerobic without signs/symptoms of physical distress      Intensity   THRR 40-80% of Max Heartrate 57-114    Ratings of Perceived Exertion 11-13    Perceived Dyspnea 0-4      Resistance Training   Training Prescription Yes    Weight 3    Reps 10-15           Perform Capillary Blood Glucose checks as needed.  Exercise Prescription Changes:   Exercise Comments:   Exercise Goals and Review:  Exercise Goals    Row Name 01/10/21 1017             Exercise Goals   Increase Physical Activity Yes       Intervention Provide advice, education, support and counseling about physical activity/exercise needs.;Develop an individualized exercise prescription for aerobic and resistive training based on initial evaluation findings, risk stratification, comorbidities and participant's personal goals.       Expected Outcomes Short Term: Attend rehab on a regular basis to increase amount of physical activity.;Long Term: Add in home exercise to make exercise part of routine and to increase amount of physical activity.;Long Term: Exercising  regularly at least 3-5 days a week.       Increase Strength and Stamina Yes       Intervention Provide advice, education, support and counseling about physical activity/exercise needs.;Develop an individualized exercise prescription for aerobic and resistive training based on initial evaluation findings, risk stratification, comorbidities and participant's personal goals.       Expected Outcomes Short Term: Increase workloads from initial exercise prescription for resistance, speed, and METs.;Short Term: Perform resistance training exercises routinely during rehab and add in resistance training at home;Long Term: Improve cardiorespiratory fitness, muscular endurance and strength as measured by increased METs and functional capacity (6MWT)       Able to understand and use rate of perceived exertion (RPE) scale Yes       Intervention Provide education and explanation on how to use RPE scale       Expected Outcomes Short Term: Able to use RPE daily in rehab to express subjective intensity level;Long Term:  Able to use RPE to guide intensity level when exercising independently       Knowledge and understanding of Target Heart Rate Range (THRR)  Yes       Intervention Provide education and explanation of THRR including how the numbers were predicted and where they are located for reference       Expected Outcomes Short Term: Able to state/look up THRR;Long Term: Able to use THRR to govern intensity when exercising independently;Short Term: Able to use daily as guideline for intensity in rehab       Able to check pulse independently Yes       Intervention Provide education and demonstration on how to check pulse in carotid and radial arteries.;Review the importance of being able to check your own pulse for safety during independent exercise       Expected Outcomes Short Term: Able to explain why pulse checking is important during independent exercise;Long Term: Able to check pulse independently and accurately        Understanding of Exercise Prescription Yes       Intervention Provide education, explanation, and written materials on patient's individual exercise prescription       Expected Outcomes Short Term: Able to explain program exercise prescription;Long Term: Able to explain home exercise prescription to exercise independently              Exercise Goals Re-Evaluation :    Discharge Exercise Prescription (Final Exercise Prescription Changes):   Nutrition:  Target Goals: Understanding of nutrition guidelines, daily intake of sodium 1500mg , cholesterol 200mg , calories 30% from fat and 7% or less from saturated fats, daily to have 5 or more servings of fruits and vegetables.  Biometrics:  Pre Biometrics - 01/10/21 1018      Pre Biometrics   Waist Circumference 46.5 inches    Hip Circumference 44 inches    Waist to Hip Ratio 1.06 %    Triceps Skinfold 8 mm    % Body Fat 27.1 %    Grip Strength 38.6 kg    Flexibility 0 in    Single Leg Stand 1.71 seconds            Nutrition Therapy Plan and Nutrition Goals:   Nutrition Assessments:  Nutrition Assessments - 01/10/21 0820      MEDFICTS Scores   Pre Score 21          MEDIFICTS Score Key:  ?70 Need to make dietary changes   40-70 Heart Healthy Diet  ? 40 Therapeutic Level Cholesterol Diet   Picture Your Plate Scores:  <42 Unhealthy dietary pattern with much room for improvement.  41-50 Dietary pattern unlikely to meet recommendations for good health and room for improvement.  51-60 More healthful dietary pattern, with some room for improvement.   >60 Healthy dietary pattern, although there may be some specific behaviors that could be improved.    Nutrition Goals Re-Evaluation:   Nutrition Goals Discharge (Final Nutrition Goals Re-Evaluation):   Psychosocial: Target Goals: Acknowledge presence or absence of significant depression and/or stress, maximize coping skills, provide positive support  system. Participant is able to verbalize types and ability to use techniques and skills needed for reducing stress and depression.  Initial Review & Psychosocial Screening:  Initial Psych Review & Screening - 01/10/21 0820      Initial Review   Current issues with None Identified      Family Dynamics   Good Support System? Yes    Comments His wife and his two daughters are his main support system.      Barriers   Psychosocial barriers to participate in program There are no identifiable barriers or psychosocial  needs.      Screening Interventions   Interventions Encouraged to exercise    Expected Outcomes Short Term goal: Identification and review with participant of any Quality of Life or Depression concerns found by scoring the questionnaire.;Long Term goal: The participant improves quality of Life and PHQ9 Scores as seen by post scores and/or verbalization of changes           Quality of Life Scores:  Quality of Life - 01/10/21 1013      Quality of Life   Select Quality of Life      Quality of Life Scores   Health/Function Pre 24.13 %    Socioeconomic Pre 26.36 %    Psych/Spiritual Pre 26.86 %    Family Pre 27.1 %    GLOBAL Pre 25.59 %          Scores of 19 and below usually indicate a poorer quality of life in these areas.  A difference of  2-3 points is a clinically meaningful difference.  A difference of 2-3 points in the total score of the Quality of Life Index has been associated with significant improvement in overall quality of life, self-image, physical symptoms, and general health in studies assessing change in quality of life.  PHQ-9: Recent Review Flowsheet Data    Depression screen [facility] 2/9 01/10/2021   Decreased Interest 0   Down, Depressed, Hopeless 0   PHQ - 2 Score 0   Altered sleeping 0   Tired, decreased energy 1   Change in appetite 0   Feeling bad or failure about yourself  0   Trouble concentrating 0   Moving slowly or fidgety/restless 0    Suicidal thoughts 0   PHQ-9 Score 1   Difficult doing work/chores Not difficult at all     Interpretation of Total Score  Total Score Depression Severity:  1-4 = Minimal depression, 5-9 = Mild depression, 10-14 = Moderate depression, 15-19 = Moderately severe depression, 20-27 = Severe depression   Psychosocial Evaluation and Intervention:  Psychosocial Evaluation - 01/10/21 0831      Psychosocial Evaluation & Interventions   Interventions Encouraged to exercise with the program and follow exercise prescription    Comments Pt has no identifiable barriers to completing rehab. He has no identifiable psychosocial issues. He scored a 1 on his PHQ-9. He has a good support system with his wife and two children. He also has a nephew who calls him every morning to speak with him. He has developed some sciatic nerve pain in his left hip since his CABG surgery. His surgeon has attributed this to laying in a hospital bed for a prolonged period of time. This causes him a lot of pain when he sleeps at night, but he does not let it limit his activity. His goals in the program are to return to doing his normal ADLs and to gain his strength back. He is eager to start the program.    Continue Psychosocial Services  No Follow up required           Psychosocial Re-Evaluation:   Psychosocial Discharge (Final Psychosocial Re-Evaluation):   Vocational Rehabilitation: Provide vocational rehab assistance to qualifying candidates.   Vocational Rehab Evaluation & Intervention:  Vocational Rehab - 01/10/21 0827      Initial Vocational Rehab Evaluation & Intervention   Assessment shows need for Vocational Rehabilitation No           Education: Education Goals: Education classes will be provided on a weekly  basis, covering required topics. Participant will state understanding/return demonstration of topics presented.  Learning Barriers/Preferences:  Learning Barriers/Preferences - 01/10/21 0827       Learning Barriers/Preferences   Learning Barriers None    Learning Preferences Audio;Computer/Internet;Group Instruction;Individual Instruction;Pictoral;Skilled Demonstration;Verbal Instruction;Video;Written Material           Education Topics: Hypertension, Hypertension Reduction -Define heart disease and high blood pressure. Discus how high blood pressure affects the body and ways to reduce high blood pressure.   Exercise and Your Heart -Discuss why it is important to exercise, the FITT principles of exercise, normal and abnormal responses to exercise, and how to exercise safely.   Angina -Discuss definition of angina, causes of angina, treatment of angina, and how to decrease risk of having angina.   Cardiac Medications -Review what the following cardiac medications are used for, how they affect the body, and side effects that may occur when taking the medications.  Medications include Aspirin, Beta blockers, calcium channel blockers, ACE Inhibitors, angiotensin receptor blockers, diuretics, digoxin, and antihyperlipidemics.   Congestive Heart Failure -Discuss the definition of CHF, how to live with CHF, the signs and symptoms of CHF, and how keep track of weight and sodium intake.   Heart Disease and Intimacy -Discus the effect sexual activity has on the heart, how changes occur during intimacy as we age, and safety during sexual activity.   Smoking Cessation / COPD -Discuss different methods to quit smoking, the health benefits of quitting smoking, and the definition of COPD.   Nutrition I: Fats -Discuss the types of cholesterol, what cholesterol does to the heart, and how cholesterol levels can be controlled.   Nutrition II: Labels -Discuss the different components of food labels and how to read food label   Heart Parts/Heart Disease and PAD -Discuss the anatomy of the heart, the pathway of blood circulation through the heart, and these are affected by heart  disease.   Stress I: Signs and Symptoms -Discuss the causes of stress, how stress may lead to anxiety and depression, and ways to limit stress.   Stress II: Relaxation -Discuss different types of relaxation techniques to limit stress.   Warning Signs of Stroke / TIA -Discuss definition of a stroke, what the signs and symptoms are of a stroke, and how to identify when someone is having stroke.   Knowledge Questionnaire Score:  Knowledge Questionnaire Score - 01/10/21 0841      Knowledge Questionnaire Score   Pre Score 15/24           Core Components/Risk Factors/Patient Goals at Admission:  Personal Goals and Risk Factors at Admission - 01/10/21 0829      Core Components/Risk Factors/Patient Goals on Admission    Weight Management Yes    Intervention Weight Management: Develop a combined nutrition and exercise program designed to reach desired caloric intake, while maintaining appropriate intake of nutrient and fiber, sodium and fats, and appropriate energy expenditure required for the weight goal.;Weight Management: Provide education and appropriate resources to help participant work on and attain dietary goals.;Weight Management/Obesity: Establish reasonable short term and long term weight goals.;Obesity: Provide education and appropriate resources to help participant work on and attain dietary goals.    Expected Outcomes Short Term: Continue to assess and modify interventions until short term weight is achieved;Long Term: Adherence to nutrition and physical activity/exercise program aimed toward attainment of established weight goal;Weight Maintenance: Understanding of the daily nutrition guidelines, which includes 25-35% calories from fat, 7% or less cal from saturated fats,  less than 200mg  cholesterol, less than 1.5gm of sodium, & 5 or more servings of fruits and vegetables daily;Weight Loss: Understanding of general recommendations for a balanced deficit meal plan, which promotes  1-2 lb weight loss per week and includes a negative energy balance of [phone] kcal/d;Understanding recommendations for meals to include 15-35% energy as protein, 25-35% energy from fat, 35-60% energy from carbohydrates, less than 200mg  of dietary cholesterol, 20-35 gm of total fiber daily;Understanding of distribution of calorie intake throughout the day with the consumption of 4-5 meals/snacks    Improve shortness of breath with ADL's Yes    Intervention Provide education, individualized exercise plan and daily activity instruction to help decrease symptoms of SOB with activities of daily living.    Expected Outcomes Short Term: Improve cardiorespiratory fitness to achieve a reduction of symptoms when performing ADLs;Long Term: Be able to perform more ADLs without symptoms or delay the onset of symptoms    Diabetes Yes    Intervention Provide education about signs/symptoms and action to take for hypo/hyperglycemia.;Provide education about proper nutrition, including hydration, and aerobic/resistive exercise prescription along with prescribed medications to achieve blood glucose in normal ranges: Fasting glucose 65-99 mg/dL    Expected Outcomes Short Term: Participant verbalizes understanding of the signs/symptoms and immediate care of hyper/hypoglycemia, proper foot care and importance of medication, aerobic/resistive exercise and nutrition plan for blood glucose control.;Long Term: Attainment of HbA1C < 7%.    Hypertension Yes    Intervention Provide education on lifestyle modifcations including regular physical activity/exercise, weight management, moderate sodium restriction and increased consumption of fresh fruit, vegetables, and low fat dairy, alcohol moderation, and smoking cessation.;Monitor prescription use compliance.    Expected Outcomes Short Term: Continued assessment and intervention until BP is < 140/70mm HG in hypertensive participants. < 130/38mm HG in hypertensive participants with  diabetes, heart failure or chronic kidney disease.;Long Term: Maintenance of blood pressure at goal levels.    Lipids Yes    Intervention Provide education and support for participant on nutrition & aerobic/resistive exercise along with prescribed medications to achieve LDL 70mg , HDL >40mg .    Expected Outcomes Short Term: Participant states understanding of desired cholesterol values and is compliant with medications prescribed. Participant is following exercise prescription and nutrition guidelines.;Long Term: Cholesterol controlled with medications as prescribed, with individualized exercise RX and with personalized nutrition plan. Value goals: LDL < 70mg , HDL > 40 mg.           Core Components/Risk Factors/Patient Goals Review:    Core Components/Risk Factors/Patient Goals at Discharge (Final Review):    ITP Comments:   Comments: Patient arrived for 1st visit/orientation/education at 0800. Patient was referred to CR by Dr. Harl Bowie due to S/P CABG x3 (Z95.1). During orientation advised patient on arrival and appointment times what to wear, what to do before, during and after exercise. Reviewed attendance and class policy.  Pt is scheduled to return Cardiac Rehab on 01/14/2021 at 0930. Pt was advised to come to class 15 minutes before class starts.  Discussed RPE/Dpysnea scales. Patient participated in warm up stretches. Patient was able to complete 6 minute walk test.  Telemetry: NSR. Patient was measured for the equipment. Discussed equipment safety with patient. Took patient pre-anthropometric measurements. Patient finished visit at [place].

## 2021-01-10 NOTE — Progress Notes (Signed)
Cardiac/Pulmonary Rehab Medication Review by a Pharmacist  Does the patient  feel that his/her medications are working for him/her?  yes  Has the patient been experiencing any side effects to the medications prescribed?  no  Does the patient measure his/her own blood pressure or blood glucose at home?  yes   Does the patient have any problems obtaining medications due to transportation or finances?   no  Understanding of regimen: good Understanding of indications: good Potential of compliance: good  Questions asked to Determine Patient Understanding of Medication Regimen:  1. What is the name of the medication?  2. What is the medication used for?  3. When should it be taken?  4. How much should be taken?  5. How will you take it?  6. What side effects should you report?  Understanding Defined as: Excellent: All questions above are correct Good: Questions 1-4 are correct Fair: Questions 1-2 are correct  Poor: 1 or none of the above questions are correct   Pharmacist comments: May need to reverify doses for all medications.  Patient had a list of medications but no doses.    Ramond Craver 01/10/2021 9:12 AM

## 2021-01-14 ENCOUNTER — Encounter (HOSPITAL_COMMUNITY)
Admission: RE | Admit: 2021-01-14 | Discharge: 2021-01-14 | Disposition: A | Discharge status: Home / Self Care | Payer: Medicare PPO | Source: Ambulatory Visit | Attending: Cardiology | Admitting: Cardiology

## 2021-01-14 ENCOUNTER — Other Ambulatory Visit: Payer: Self-pay

## 2021-01-14 DIAGNOSIS — Z951 Presence of aortocoronary bypass graft: Secondary | ICD-10-CM

## 2021-01-14 NOTE — Progress Notes (Signed)
Daily Session Note  Patient Details  Name: Craig Taylor MRN: [id number] Date of Birth: 07/01/1941 Referring Provider:   Flowsheet Row CARDIAC REHAB PHASE II ORIENTATION from 01/10/2021 in [place]  Referring Provider Dr. Harl Bowie      Encounter Date: 01/14/2021  Check In:  Session Check In - 01/14/21 0815      Check-In   Supervising physician immediately available to respond to emergencies [facility] MD immediately available    Physician(s) Dr. Harrington Challenger    Location [facility]    Staff Present Cathren Harsh, MS, Exercise Physiologist;Dalton Kris Mouton, MS, ACSM-CEP, Exercise Physiologist    Virtual Visit No    Medication changes reported     No    Fall or balance concerns reported    No    Tobacco Cessation No Change    Warm-up and Cool-down Performed as group-led instruction    Resistance Training Performed Yes    VAD Patient? No    PAD/SET Patient? No      Pain Assessment   Currently in Pain? No/denies    Pain Score 0-No pain    Multiple Pain Sites No           Capillary Blood Glucose: No results found for this or any previous visit (from the past 24 hour(s)).    Social History   Tobacco Use  Smoking Status Former Smoker  . Packs/day: 2.00  . Years: 21.00  . Pack years: 42.00  . Types: Cigarettes  . Start date: 07/04/1957  . Quit date: 08/09/1983  . Years since quitting: 37.4  Smokeless Tobacco Former Systems developer  . Types: Chew  . Quit date: 08/11/2012    Goals Met:  Independence with exercise equipment Exercise tolerated well No report of cardiac concerns or symptoms Strength training completed today  Goals Unmet:  Not Applicable  Comments: check  out 0915   Dr. Kathie Dike is Medical Director for [facility].

## 2021-01-16 ENCOUNTER — Other Ambulatory Visit: Payer: Self-pay

## 2021-01-16 ENCOUNTER — Encounter (HOSPITAL_COMMUNITY)
Admission: RE | Admit: 2021-01-16 | Discharge: 2021-01-16 | Disposition: A | Discharge status: Home / Self Care | Payer: Medicare PPO | Source: Ambulatory Visit | Attending: Cardiology | Admitting: Cardiology

## 2021-01-16 DIAGNOSIS — Z951 Presence of aortocoronary bypass graft: Secondary | ICD-10-CM

## 2021-01-16 NOTE — Progress Notes (Signed)
Daily Session Note  Patient Details  Name: Craig Taylor MRN: [id number] Date of Birth: [birth year] Referring Provider:   Flowsheet Row CARDIAC REHAB PHASE II ORIENTATION from 01/10/2021 in [place]  Referring Provider Dr. Harl Bowie      Encounter Date: 01/16/2021  Check In:  Session Check In - 01/16/21 0930      Check-In   Supervising physician immediately available to respond to emergencies [facility] MD immediately available    Physician(s) Dr. Harrington Challenger    Location [facility]    Staff Present Cathren Harsh, MS, Exercise Physiologist;Dalton Kris Mouton, MS, ACSM-CEP, Exercise Physiologist    Virtual Visit No    Medication changes reported     No    Fall or balance concerns reported    No    Tobacco Cessation No Change    Warm-up and Cool-down Performed as group-led instruction    Resistance Training Performed Yes    VAD Patient? No    PAD/SET Patient? No      Pain Assessment   Currently in Pain? No/denies    Pain Score 0-No pain    Multiple Pain Sites No           Capillary Blood Glucose: No results found for this or any previous visit (from the past 24 hour(s)).    Social History   Tobacco Use  Smoking Status Former Smoker  . Packs/day: 2.00  . Years: 21.00  . Pack years: 42.00  . Types: Cigarettes  . Start date: 07/04/1957  . Quit date: 08/09/1983  . Years since quitting: 37.4  Smokeless Tobacco Former Systems developer  . Types: Chew  . Quit date: 08/11/2012    Goals Met:  Independence with exercise equipment Exercise tolerated well No report of cardiac concerns or symptoms Strength training completed today  Goals Unmet:  Not Applicable  Comments: check out 1030   Dr. Kathie Dike is Medical Director for [facility].

## 2021-01-18 ENCOUNTER — Encounter (HOSPITAL_COMMUNITY): Payer: Medicare PPO

## 2021-01-21 ENCOUNTER — Encounter (HOSPITAL_COMMUNITY): Payer: Medicare PPO

## 2021-01-23 ENCOUNTER — Encounter (HOSPITAL_COMMUNITY): Payer: Medicare PPO

## 2021-01-25 ENCOUNTER — Encounter (HOSPITAL_COMMUNITY): Payer: Medicare PPO

## 2021-01-28 ENCOUNTER — Encounter (HOSPITAL_COMMUNITY): Payer: Medicare PPO

## 2021-01-30 ENCOUNTER — Encounter (HOSPITAL_COMMUNITY): Payer: Medicare PPO

## 2021-01-30 NOTE — Progress Notes (Signed)
Cardiac Individual Treatment Plan  Patient Details  Name: Craig Taylor MRN: [id number] Date of Birth: 03-19-1941 Referring Provider:   Flowsheet Row CARDIAC REHAB PHASE II ORIENTATION from 01/10/2021 in [place]  Referring Provider Dr. Harl Bowie       Initial Encounter Date:  Flowsheet Row CARDIAC REHAB PHASE II ORIENTATION from 01/10/2021 in [place]  Date 01/10/21       Visit Diagnosis: S/P CABG x 3  Patient's Home Medications on Admission:  Current Outpatient Medications:    apixaban (ELIQUIS) 5 MG TABS tablet, Take 1 tablet (5 mg total) by mouth 2 (two) times daily., Disp: 60 tablet, Rfl: 1   aspirin EC 81 MG tablet, Take 81 mg by mouth daily., Disp: , Rfl:    Cholecalciferol (VITAMIN D-3) 125 MCG (5000 UT) TABS, Take 1 tablet by mouth at bedtime. , Disp: , Rfl:    Cinnamon 500 MG TABS, Take 1 tablet by mouth 2 (two) times daily. , Disp: , Rfl:    diclofenac Sodium (VOLTAREN) 1 % GEL, Apply small amount to affected area, Disp: 2 g, Rfl: 0   dorzolamide-timolol (COSOPT) 22.3-6.8 MG/ML ophthalmic solution, Place 1 drop into the right eye in the morning and at bedtime., Disp: , Rfl:    enalapril (VASOTEC) 2.5 MG tablet, Take 2.5 mg by mouth daily. , Disp: , Rfl:    metFORMIN (GLUCOPHAGE) 500 MG tablet, Take 1,000 mg by mouth 2 (two) times daily with a meal., Disp: , Rfl:    metoprolol tartrate (LOPRESSOR) 50 MG tablet, Take 1 tablet (50 mg total) by mouth 2 (two) times daily., Disp: 60 tablet, Rfl: 2   Omega-3 Fatty Acids (FISH OIL PO), Take 1 tablet by mouth in the morning and at bedtime., Disp: , Rfl:    ondansetron (ZOFRAN) 4 MG tablet, Take 1 tablet (4 mg total) by mouth daily as needed for nausea or vomiting., Disp: 30 tablet, Rfl: 1   simvastatin (ZOCOR) 20 MG tablet, Take 1 tablet (20 mg total) by mouth every evening., Disp: 30 tablet, Rfl: 2   tamsulosin (FLOMAX) 0.4 MG CAPS capsule, Take 0.4 mg by mouth in the morning and at  bedtime., Disp: , Rfl:    zinc gluconate 50 MG tablet, Take 50 mg by mouth daily., Disp: , Rfl:   Past Medical History: Past Medical History:  Diagnosis Date   Benign localized prostatic hyperplasia with lower urinary tract symptoms (LUTS)    Bladder cancer [facility])    urologist--- dr winter---  s/p  TURBT 09/ 2020   CKD (chronic kidney disease), stage III ([place])    neprologist--- dr Lowanda Foster  (12-15-2019  per pt was released to pcp at [facility] 02-06-2018 scanned in epic under media   Coronary artery disease cardiologist--- dr Bronson Ing   hx MI in 2003--- 08-02-2002  s/p cardiac cath with PCI and DES to [facility] and PDA;  cath 10-04-2002 patent stents w/ 30-40% AV groove Cx with ef 50% and mild inferior hypokinesis   COVID-19 12/16/2019   asymptomatic   Full dentures    Glaucoma, right eye    History of basal cell carcinoma (BCC) excision    History of gout    per pt one episode 01/ 2021    History of kidney stones    History of MI (myocardial infarction) 08/01/2002   History of ulcer of large intestine    per pt approx. 2002 told due to oral voltaren , colonoscopy/ egd with no intervention,  but  did have blood transfusions   Hyperlipidemia    Hypertension    followed by pcp   (nuclear stress test in epic 05-11-2018  low risk w/ no ischemia, nuclear ef 55%)   IDA (iron deficiency anemia)    Nephrolithiasis    Non-insulin dependent type 2 diabetes mellitus ([place])    followed by pcp  (12-15-2019   per pt checks blood sugar 3 times a week,  fasting sugar-- 120-140)   S/P drug eluting coronary stent placement 08/02/2002   to proximal RCA and PDA   Wears glasses     Tobacco Use: Social History   Tobacco Use  Smoking Status Former   Packs/day: 2.00   Years: 21.00   Pack years: 42.00   Types: Cigarettes   Start date: 07/04/1957   Quit date: 08/09/1983   Years since quitting: 37.5  Smokeless Tobacco Former   Types: Chew   Quit date: 08/11/2012    Labs: Recent Review Flowsheet Data      Labs for ITP Cardiac and Pulmonary Rehab Latest Ref Rng & Units 11/26/2020 11/26/2020 11/26/2020 11/26/2020 11/26/2020   Cholestrol 0 - 200 mg/dL - - - - -   LDLCALC 0 - 99 mg/dL - - - - -   HDL >40 mg/dL - - - - -   Trlycerides <150 mg/dL - - - - -   Hemoglobin A1c 4.8 - 5.6 % - - - - -   PHART 7.350 - 7.450 7.324(L) 7.374 7.324(L) 7.296(L) 7.295(L)   PCO2ART 32.0 - 48.0 mmHg 38.8 39.6 42.8 48.2(H) 45.8   HCO3 20.0 - 28.0 mmol/L 20.5 23.4 22.3 23.5 22.2   TCO2 22 - 32 mmol/L 22 25 24 25 24    ACIDBASEDEF 0.0 - 2.0 mmol/L 5.0(H) 2.0 4.0(H) 3.0(H) 4.0(H)   O2SAT % 92.0 99.0 97.0 93.0 95.0       Capillary Blood Glucose: Lab Results  Component Value Date   GLUCAP 151 (H) 12/01/2020   GLUCAP 139 (H) 12/01/2020   GLUCAP 129 (H) 11/30/2020   GLUCAP 141 (H) 11/30/2020   GLUCAP 196 (H) 11/30/2020    POCT Glucose     Row Name 01/10/21 0853 01/16/21 1138           POCT Blood Glucose   Pre-Exercise 129 mg/dL 134 mg/dL               Exercise Target Goals: Exercise Program Goal: Individual exercise prescription set using results from initial 6 min walk test and THRR while considering  patient's activity barriers and safety.   Exercise Prescription Goal: Starting with aerobic activity 30 plus minutes a day, 3 days per week for initial exercise prescription. Provide home exercise prescription and guidelines that participant acknowledges understanding prior to discharge.  Activity Barriers & Risk Stratification:  Activity Barriers & Cardiac Risk Stratification - 01/10/21 0814       Activity Barriers & Cardiac Risk Stratification   Activity Barriers Arthritis;Neck/Spine Problems;Deconditioning;Incisional Pain    Cardiac Risk Stratification High             6 Minute Walk:  6 Minute Walk     Row Name 01/10/21 1015         6 Minute Walk   Phase Initial     Distance 1100 feet     Walk Time 6 minutes     # of Rest Breaks 0     MPH 2.08     METS 2.25     RPE 10  VO2 Peak 7.87     Symptoms Yes (comment)     Comments left hip pain 4/10     Resting HR 62 bpm     Resting BP 110/62     Resting Oxygen Saturation  98 %     Exercise Oxygen Saturation  during 6 min walk 97 %     Max Ex. HR 88 bpm     Max Ex. BP 130/52     2 Minute Post BP 100/58              Oxygen Initial Assessment:   Oxygen Re-Evaluation:   Oxygen Discharge (Final Oxygen Re-Evaluation):   Initial Exercise Prescription:  Initial Exercise Prescription - 01/10/21 1000       Date of Initial Exercise RX and Referring Provider   Date 01/10/21    Referring Provider Dr. Harl Bowie    Expected Discharge Date 04/05/21      NuStep   Level 1    SPM 80    Minutes 22      Elliptical   Level 1    Speed 50    Minutes 17      Prescription Details   Frequency (times per week) 2    Duration Progress to 30 minutes of continuous aerobic without signs/symptoms of physical distress      Intensity   THRR 40-80% of Max Heartrate 57-114    Ratings of Perceived Exertion 11-13    Perceived Dyspnea 0-4      Resistance Training   Training Prescription Yes    Weight 3    Reps 10-15             Perform Capillary Blood Glucose checks as needed.  Exercise Prescription Changes:   Exercise Prescription Changes     Row Name 01/16/21 1139             Response to Exercise   Blood Pressure (Admit) 126/68       Blood Pressure (Exercise) 132/66       Blood Pressure (Exit) 100/58       Heart Rate (Admit) 78 bpm       Heart Rate (Exercise) 80 bpm       Heart Rate (Exit) 81 bpm       Rating of Perceived Exertion (Exercise) 10       Duration Continue with 30 min of aerobic exercise without signs/symptoms of physical distress.       Intensity THRR unchanged               Progression     Progression Continue to progress workloads to maintain intensity without signs/symptoms of physical distress.               Resistance Training     Training Prescription Yes        Weight 3 lbs       Reps 10-15       Time 10 Minutes               NuStep     Level 1       SPM 80       Minutes 22       METs 1.95               Elliptical     Level 1       Speed 37       Minutes 17       METs 1.6  Exercise Comments:   Exercise Comments     Row Name 01/14/21 0946           Exercise Comments Patient completed first exercise session today. He tolerated exercise well with no complaints. He enjoyed the session and is looking forward to coming back.                Exercise Goals and Review:   Exercise Goals     Row Name 01/10/21 1017 01/28/21 1141           Exercise Goals   Increase Physical Activity Yes Yes      Intervention Provide advice, education, support and counseling about physical activity/exercise needs.;Develop an individualized exercise prescription for aerobic and resistive training based on initial evaluation findings, risk stratification, comorbidities and participant's personal goals. Provide advice, education, support and counseling about physical activity/exercise needs.;Develop an individualized exercise prescription for aerobic and resistive training based on initial evaluation findings, risk stratification, comorbidities and participant's personal goals.      Expected Outcomes Short Term: Attend rehab on a regular basis to increase amount of physical activity.;Long Term: Add in home exercise to make exercise part of routine and to increase amount of physical activity.;Long Term: Exercising regularly at least 3-5 days a week. Short Term: Attend rehab on a regular basis to increase amount of physical activity.;Long Term: Add in home exercise to make exercise part of routine and to increase amount of physical activity.;Long Term: Exercising regularly at least 3-5 days a week.      Increase Strength and Stamina Yes Yes      Intervention Provide advice, education, support and counseling about physical activity/exercise  needs.;Develop an individualized exercise prescription for aerobic and resistive training based on initial evaluation findings, risk stratification, comorbidities and participant's personal goals. Provide advice, education, support and counseling about physical activity/exercise needs.;Develop an individualized exercise prescription for aerobic and resistive training based on initial evaluation findings, risk stratification, comorbidities and participant's personal goals.      Expected Outcomes Short Term: Increase workloads from initial exercise prescription for resistance, speed, and METs.;Short Term: Perform resistance training exercises routinely during rehab and add in resistance training at home;Long Term: Improve cardiorespiratory fitness, muscular endurance and strength as measured by increased METs and functional capacity (6MWT) Short Term: Increase workloads from initial exercise prescription for resistance, speed, and METs.;Short Term: Perform resistance training exercises routinely during rehab and add in resistance training at home;Long Term: Improve cardiorespiratory fitness, muscular endurance and strength as measured by increased METs and functional capacity (6MWT)      Able to understand and use rate of perceived exertion (RPE) scale Yes Yes      Intervention Provide education and explanation on how to use RPE scale Provide education and explanation on how to use RPE scale      Expected Outcomes Short Term: Able to use RPE daily in rehab to express subjective intensity level;Long Term:  Able to use RPE to guide intensity level when exercising independently Short Term: Able to use RPE daily in rehab to express subjective intensity level;Long Term:  Able to use RPE to guide intensity level when exercising independently      Knowledge and understanding of Target Heart Rate Range (THRR) Yes Yes      Intervention Provide education and explanation of THRR including how the numbers were predicted and  where they are located for reference Provide education and explanation of THRR including how the numbers were predicted and where they are located  for reference      Expected Outcomes Short Term: Able to state/look up THRR;Long Term: Able to use THRR to govern intensity when exercising independently;Short Term: Able to use daily as guideline for intensity in rehab Short Term: Able to state/look up THRR;Long Term: Able to use THRR to govern intensity when exercising independently;Short Term: Able to use daily as guideline for intensity in rehab      Able to check pulse independently Yes Yes      Intervention Provide education and demonstration on how to check pulse in carotid and radial arteries.;Review the importance of being able to check your own pulse for safety during independent exercise Provide education and demonstration on how to check pulse in carotid and radial arteries.;Review the importance of being able to check your own pulse for safety during independent exercise      Expected Outcomes Short Term: Able to explain why pulse checking is important during independent exercise;Long Term: Able to check pulse independently and accurately Short Term: Able to explain why pulse checking is important during independent exercise;Long Term: Able to check pulse independently and accurately      Understanding of Exercise Prescription Yes Yes      Intervention Provide education, explanation, and written materials on patient's individual exercise prescription Provide education, explanation, and written materials on patient's individual exercise prescription      Expected Outcomes Short Term: Able to explain program exercise prescription;Long Term: Able to explain home exercise prescription to exercise independently Short Term: Able to explain program exercise prescription;Long Term: Able to explain home exercise prescription to exercise independently               Exercise Goals Re-Evaluation :   Exercise Goals Re-Evaluation     [place] Name 01/28/21 1141             Exercise Goal Re-Evaluation   Exercise Goals Review Increase Physical Activity;Increase Strength and Stamina;Able to understand and use rate of perceived exertion (RPE) scale;Knowledge and understanding of Target Heart Rate Range (THRR);Able to check pulse independently;Understanding of Exercise Prescription       Comments Pt has completed 3 sessions of cardiac rehab. He has been absent for the past five sessions, with the last three being no call no shows. It is unclear if he will return or not. Will monitor and progress as able.       Expected Outcomes Through exercise at rehab and at home, the patient will meet their stated goals.                 Discharge Exercise Prescription (Final Exercise Prescription Changes):  Exercise Prescription Changes - 01/16/21 1139       Response to Exercise   Blood Pressure (Admit) 126/68    Blood Pressure (Exercise) 132/66    Blood Pressure (Exit) 100/58    Heart Rate (Admit) 78 bpm    Heart Rate (Exercise) 80 bpm    Heart Rate (Exit) 81 bpm    Rating of Perceived Exertion (Exercise) 10    Duration Continue with 30 min of aerobic exercise without signs/symptoms of physical distress.    Intensity THRR unchanged      Progression   Progression Continue to progress workloads to maintain intensity without signs/symptoms of physical distress.      Resistance Training   Training Prescription Yes    Weight 3 lbs    Reps 10-15    Time 10 Minutes      NuStep   Level 1  SPM 80    Minutes 22    METs 1.95      Elliptical   Level 1    Speed 37    Minutes 17    METs 1.6             Nutrition:  Target Goals: Understanding of nutrition guidelines, daily intake of sodium 1500mg , cholesterol 200mg , calories 30% from fat and 7% or less from saturated fats, daily to have 5 or more servings of fruits and vegetables.  Biometrics:  Pre Biometrics - 01/10/21 1018        Pre Biometrics   Waist Circumference 46.5 inches    Hip Circumference 44 inches    Waist to Hip Ratio 1.06 %    Triceps Skinfold 8 mm    % Body Fat 27.1 %    Grip Strength 38.6 kg    Flexibility 0 in    Single Leg Stand 1.71 seconds              Nutrition Therapy Plan and Nutrition Goals:  Nutrition Therapy & Goals - 01/23/21 0726       Personal Nutrition Goals   Comments Patient scored 21 on his diet assessment. We offer 2 educational sessions on heart heatlhy nutrition with handouts and assistance with RD referral if patient is interested.      Intervention Plan   Intervention Nutrition handout(s) given to patient.             Nutrition Assessments:  Nutrition Assessments - 01/10/21 0820       MEDFICTS Scores   Pre Score 21            MEDIFICTS Score Key: ?70 Need to make dietary changes  40-70 Heart Healthy Diet ? 40 Therapeutic Level Cholesterol Diet   Picture Your Plate Scores: <87 Unhealthy dietary pattern with much room for improvement. 41-50 Dietary pattern unlikely to meet recommendations for good health and room for improvement. 51-60 More healthful dietary pattern, with some room for improvement.  >60 Healthy dietary pattern, although there may be some specific behaviors that could be improved.    Nutrition Goals Re-Evaluation:   Nutrition Goals Discharge (Final Nutrition Goals Re-Evaluation):   Psychosocial: Target Goals: Acknowledge presence or absence of significant depression and/or stress, maximize coping skills, provide positive support system. Participant is able to verbalize types and ability to use techniques and skills needed for reducing stress and depression.  Initial Review & Psychosocial Screening:  Initial Psych Review & Screening - 01/10/21 0820       Initial Review   Current issues with None Identified      Family Dynamics   Good Support System? Yes    Comments His wife and his two daughters are his main  support system.      Barriers   Psychosocial barriers to participate in program There are no identifiable barriers or psychosocial needs.      Screening Interventions   Interventions Encouraged to exercise    Expected Outcomes Short Term goal: Identification and review with participant of any Quality of Life or Depression concerns found by scoring the questionnaire.;Long Term goal: The participant improves quality of Life and PHQ9 Scores as seen by post scores and/or verbalization of changes             Quality of Life Scores:  Quality of Life - 01/10/21 1013       Quality of Life   Select Quality of Life      Quality  of Life Scores   Health/Function Pre 24.13 %    Socioeconomic Pre 26.36 %    Psych/Spiritual Pre 26.86 %    Family Pre 27.1 %    GLOBAL Pre 25.59 %            Scores of 19 and below usually indicate a poorer quality of life in these areas.  A difference of  2-3 points is a clinically meaningful difference.  A difference of 2-3 points in the total score of the Quality of Life Index has been associated with significant improvement in overall quality of life, self-image, physical symptoms, and general health in studies assessing change in quality of life.  PHQ-9: Recent Review Flowsheet Data     Depression screen [facility] 2/9 01/10/2021   Decreased Interest 0   Down, Depressed, Hopeless 0   PHQ - 2 Score 0   Altered sleeping 0   Tired, decreased energy 1   Change in appetite 0   Feeling bad or failure about yourself  0   Trouble concentrating 0   Moving slowly or fidgety/restless 0   Suicidal thoughts 0   PHQ-9 Score 1   Difficult doing work/chores Not difficult at all      Interpretation of Total Score  Total Score Depression Severity:  1-4 = Minimal depression, 5-9 = Mild depression, 10-14 = Moderate depression, 15-19 = Moderately severe depression, 20-27 = Severe depression   Psychosocial Evaluation and Intervention:  Psychosocial Evaluation -  01/10/21 0831       Psychosocial Evaluation & Interventions   Interventions Encouraged to exercise with the program and follow exercise prescription    Comments Pt has no identifiable barriers to completing rehab. He has no identifiable psychosocial issues. He scored a 1 on his PHQ-9. He has a good support system with his wife and two children. He also has a nephew who calls him every morning to speak with him. He has developed some sciatic nerve pain in his left hip since his CABG surgery. His surgeon has attributed this to laying in a hospital bed for a prolonged period of time. This causes him a lot of pain when he sleeps at night, but he does not let it limit his activity. His goals in the program are to return to doing his normal ADLs and to gain his strength back. He is eager to start the program.    Continue Psychosocial Services  No Follow up required             Psychosocial Re-Evaluation:  Psychosocial Re-Evaluation     [place] Name 01/23/21 0727             Psychosocial Re-Evaluation   Current issues with None Identified       Comments Patient has no psychosocial barriers identified. He is new to the program completing 3 sessions. We will continue to monitor.       Expected Outcomes Patient will continue to have no psychosocial barriers identified.       Interventions Stress management education;Encouraged to attend Cardiac Rehabilitation for the exercise;Relaxation education       Continue Psychosocial Services  No Follow up required                Psychosocial Discharge (Final Psychosocial Re-Evaluation):  Psychosocial Re-Evaluation - 01/23/21 0727       Psychosocial Re-Evaluation   Current issues with None Identified    Comments Patient has no psychosocial barriers identified. He is new to the program completing  3 sessions. We will continue to monitor.    Expected Outcomes Patient will continue to have no psychosocial barriers identified.    Interventions Stress  management education;Encouraged to attend Cardiac Rehabilitation for the exercise;Relaxation education    Continue Psychosocial Services  No Follow up required             Vocational Rehabilitation: Provide vocational rehab assistance to qualifying candidates.   Vocational Rehab Evaluation & Intervention:  Vocational Rehab - 01/10/21 0827       Initial Vocational Rehab Evaluation & Intervention   Assessment shows need for Vocational Rehabilitation No             Education: Education Goals: Education classes will be provided on a weekly basis, covering required topics. Participant will state understanding/return demonstration of topics presented.  Learning Barriers/Preferences:  Learning Barriers/Preferences - 01/10/21 0827       Learning Barriers/Preferences   Learning Barriers None    Learning Preferences Audio;Computer/Internet;Group Instruction;Individual Instruction;Pictoral;Skilled Demonstration;Verbal Instruction;Video;Written Material             Education Topics: Hypertension, Hypertension Reduction -Define heart disease and high blood pressure. Discus how high blood pressure affects the body and ways to reduce high blood pressure.   Exercise and Your Heart -Discuss why it is important to exercise, the FITT principles of exercise, normal and abnormal responses to exercise, and how to exercise safely.   Angina -Discuss definition of angina, causes of angina, treatment of angina, and how to decrease risk of having angina.   Cardiac Medications -Review what the following cardiac medications are used for, how they affect the body, and side effects that may occur when taking the medications.  Medications include Aspirin, Beta blockers, calcium channel blockers, ACE Inhibitors, angiotensin receptor blockers, diuretics, digoxin, and antihyperlipidemics.   Congestive Heart Failure -Discuss the definition of CHF, how to live with CHF, the signs and symptoms  of CHF, and how keep track of weight and sodium intake.   Heart Disease and Intimacy -Discus the effect sexual activity has on the heart, how changes occur during intimacy as we age, and safety during sexual activity.   Smoking Cessation / COPD -Discuss different methods to quit smoking, the health benefits of quitting smoking, and the definition of COPD.   Nutrition I: Fats -Discuss the types of cholesterol, what cholesterol does to the heart, and how cholesterol levels can be controlled.   Nutrition II: Labels -Discuss the different components of food labels and how to read food label   Heart Parts/Heart Disease and PAD -Discuss the anatomy of the heart, the pathway of blood circulation through the heart, and these are affected by heart disease.   Stress I: Signs and Symptoms -Discuss the causes of stress, how stress may lead to anxiety and depression, and ways to limit stress.   Stress II: Relaxation -Discuss different types of relaxation techniques to limit stress.   Warning Signs of Stroke / TIA -Discuss definition of a stroke, what the signs and symptoms are of a stroke, and how to identify when someone is having stroke.   Knowledge Questionnaire Score:  Knowledge Questionnaire Score - 01/10/21 0841       Knowledge Questionnaire Score   Pre Score 15/24             Core Components/Risk Factors/Patient Goals at Admission:  Personal Goals and Risk Factors at Admission - 01/10/21 0829       Core Components/Risk Factors/Patient Goals on Admission    Weight Management Yes  Intervention Weight Management: Develop a combined nutrition and exercise program designed to reach desired caloric intake, while maintaining appropriate intake of nutrient and fiber, sodium and fats, and appropriate energy expenditure required for the weight goal.;Weight Management: Provide education and appropriate resources to help participant work on and attain dietary goals.;Weight  Management/Obesity: Establish reasonable short term and long term weight goals.;Obesity: Provide education and appropriate resources to help participant work on and attain dietary goals.    Expected Outcomes Short Term: Continue to assess and modify interventions until short term weight is achieved;Long Term: Adherence to nutrition and physical activity/exercise program aimed toward attainment of established weight goal;Weight Maintenance: Understanding of the daily nutrition guidelines, which includes 25-35% calories from fat, 7% or less cal from saturated fats, less than 200mg  cholesterol, less than 1.5gm of sodium, & 5 or more servings of fruits and vegetables daily;Weight Loss: Understanding of general recommendations for a balanced deficit meal plan, which promotes 1-2 lb weight loss per week and includes a negative energy balance of [phone] kcal/d;Understanding recommendations for meals to include 15-35% energy as protein, 25-35% energy from fat, 35-60% energy from carbohydrates, less than 200mg  of dietary cholesterol, 20-35 gm of total fiber daily;Understanding of distribution of calorie intake throughout the day with the consumption of 4-5 meals/snacks    Improve shortness of breath with ADL's Yes    Intervention Provide education, individualized exercise plan and daily activity instruction to help decrease symptoms of SOB with activities of daily living.    Expected Outcomes Short Term: Improve cardiorespiratory fitness to achieve a reduction of symptoms when performing ADLs;Long Term: Be able to perform more ADLs without symptoms or delay the onset of symptoms    Diabetes Yes    Intervention Provide education about signs/symptoms and action to take for hypo/hyperglycemia.;Provide education about proper nutrition, including hydration, and aerobic/resistive exercise prescription along with prescribed medications to achieve blood glucose in normal ranges: Fasting glucose 65-99 mg/dL    Expected  Outcomes Short Term: Participant verbalizes understanding of the signs/symptoms and immediate care of hyper/hypoglycemia, proper foot care and importance of medication, aerobic/resistive exercise and nutrition plan for blood glucose control.;Long Term: Attainment of HbA1C < 7%.    Hypertension Yes    Intervention Provide education on lifestyle modifcations including regular physical activity/exercise, weight management, moderate sodium restriction and increased consumption of fresh fruit, vegetables, and low fat dairy, alcohol moderation, and smoking cessation.;Monitor prescription use compliance.    Expected Outcomes Short Term: Continued assessment and intervention until BP is < 140/52mm HG in hypertensive participants. < 130/25mm HG in hypertensive participants with diabetes, heart failure or chronic kidney disease.;Long Term: Maintenance of blood pressure at goal levels.    Lipids Yes    Intervention Provide education and support for participant on nutrition & aerobic/resistive exercise along with prescribed medications to achieve LDL 70mg , HDL >40mg .    Expected Outcomes Short Term: Participant states understanding of desired cholesterol values and is compliant with medications prescribed. Participant is following exercise prescription and nutrition guidelines.;Long Term: Cholesterol controlled with medications as prescribed, with individualized exercise RX and with personalized nutrition plan. Value goals: LDL < 70mg , HDL > 40 mg.             Core Components/Risk Factors/Patient Goals Review:   Goals and Risk Factor Review     Row Name 01/23/21 0728             Core Components/Risk Factors/Patient Goals Review   Personal Goals Review Weight Management/Obesity;Diabetes  Review Patient is new to the program completed 3 sessions. 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 He has multiple risk factors for CAD and is participating in the program for risk modification. His personal goals for  the program are to get stronger and be able to do his normal ADL's and be able to travel in his RV. We will continue to monitor his progress as he works towards meeting these goals.       Expected Outcomes Patient will complete the program meeting both program and personal goals.                Core Components/Risk Factors/Patient Goals at Discharge (Final Review):   Goals and Risk Factor Review - 01/23/21 0728       Core Components/Risk Factors/Patient Goals Review   Personal Goals Review Weight Management/Obesity;Diabetes    Review Patient is new to the program completed 3 sessions. 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 He has multiple risk factors for CAD and is participating in the program for risk modification. His personal goals for the program are to get stronger and be able to do his normal ADL's and be able to travel in his RV. We will continue to monitor his progress as he works towards meeting these goals.    Expected Outcomes Patient will complete the program meeting both program and personal goals.             ITP Comments:   Comments: ITP REVIEW Pt is making expected progress toward Cardiac Rehab goals after completing 3 sessions. Recommend continued exercise, life style modification, education, and increased stamina and strength.

## 2021-02-01 ENCOUNTER — Encounter (HOSPITAL_COMMUNITY): Payer: Medicare PPO

## 2021-02-04 ENCOUNTER — Encounter (HOSPITAL_COMMUNITY): Admission: RE | Admit: 2021-02-04 | Payer: Medicare PPO | Source: Ambulatory Visit

## 2021-02-05 NOTE — Addendum Note (Signed)
Encounter addended by: Philis Kendall on: 02/05/2021 1:01 PM  Actions taken: Flowsheet accepted, Clinical Note Signed

## 2021-02-05 NOTE — Progress Notes (Signed)
Discharge Progress Report  Patient Details  Name: Craig Taylor MRN: [id number] Date of Birth: 1940-12-18 Referring Provider:   Flowsheet Row CARDIAC REHAB PHASE II ORIENTATION from 01/10/2021 in [place]  Referring Provider Dr. Harl Bowie        Number of Visits: 3  Reason for Discharge:  Early Exit:  Lack of attendance  Smoking History:  Social History   Tobacco Use  Smoking Status Former   Packs/day: 2.00   Years: 21.00   Pack years: 42.00   Types: Cigarettes   Start date: 07/04/1957   Quit date: 08/09/1983   Years since quitting: 37.5  Smokeless Tobacco Former   Types: Chew   Quit date: 08/11/2012    Diagnosis:  S/P CABG x 3  ADL UCSD:   Initial Exercise Prescription:  Initial Exercise Prescription - 01/10/21 1000       Date of Initial Exercise RX and Referring Provider   Date 01/10/21    Referring Provider Dr. Harl Bowie    Expected Discharge Date 04/05/21      NuStep   Level 1    SPM 80    Minutes 22      Elliptical   Level 1    Speed 50    Minutes 17      Prescription Details   Frequency (times per week) 2    Duration Progress to 30 minutes of continuous aerobic without signs/symptoms of physical distress      Intensity   THRR 40-80% of Max Heartrate 57-114    Ratings of Perceived Exertion 11-13    Perceived Dyspnea 0-4      Resistance Training   Training Prescription Yes    Weight 3    Reps 10-15             Discharge Exercise Prescription (Final Exercise Prescription Changes):  Exercise Prescription Changes - 01/16/21 1139       Response to Exercise   Blood Pressure (Admit) 126/68    Blood Pressure (Exercise) 132/66    Blood Pressure (Exit) 100/58    Heart Rate (Admit) 78 bpm    Heart Rate (Exercise) 80 bpm    Heart Rate (Exit) 81 bpm    Rating of Perceived Exertion (Exercise) 10    Duration Continue with 30 min of aerobic exercise without signs/symptoms of physical distress.    Intensity THRR  unchanged      Progression   Progression Continue to progress workloads to maintain intensity without signs/symptoms of physical distress.      Resistance Training   Training Prescription Yes    Weight 3 lbs    Reps 10-15    Time 10 Minutes      NuStep   Level 1    SPM 80    Minutes 22    METs 1.95      Elliptical   Level 1    Speed 37    Minutes 17    METs 1.6             Functional Capacity:  6 Minute Walk     Row Name 01/10/21 1015         6 Minute Walk   Phase Initial     Distance 1100 feet     Walk Time 6 minutes     # of Rest Breaks 0     MPH 2.08     METS 2.25     RPE 10     VO2 Peak 7.87  Symptoms Yes (comment)     Comments left hip pain 4/10     Resting HR 62 bpm     Resting BP 110/62     Resting Oxygen Saturation  98 %     Exercise Oxygen Saturation  during 6 min walk 97 %     Max Ex. HR 88 bpm     Max Ex. BP 130/52     2 Minute Post BP 100/58              Psychological, QOL, Others - Outcomes: PHQ 2/9: Depression screen PHQ 2/9 01/10/2021  Decreased Interest 0  Down, Depressed, Hopeless 0  PHQ - 2 Score 0  Altered sleeping 0  Tired, decreased energy 1  Change in appetite 0  Feeling bad or failure about yourself  0  Trouble concentrating 0  Moving slowly or fidgety/restless 0  Suicidal thoughts 0  PHQ-9 Score 1  Difficult doing work/chores Not difficult at all    Quality of Life:  Quality of Life - 01/10/21 1013       Quality of Life   Select Quality of Life      Quality of Life Scores   Health/Function Pre 24.13 %    Socioeconomic Pre 26.36 %    Psych/Spiritual Pre 26.86 %    Family Pre 27.1 %    GLOBAL Pre 25.59 %             Personal Goals: Goals established at orientation with interventions provided to work toward goal.  Personal Goals and Risk Factors at Admission - 01/10/21 0829       Core Components/Risk Factors/Patient Goals on Admission    Weight Management Yes    Intervention Weight  Management: Develop a combined nutrition and exercise program designed to reach desired caloric intake, while maintaining appropriate intake of nutrient and fiber, sodium and fats, and appropriate energy expenditure required for the weight goal.;Weight Management: Provide education and appropriate resources to help participant work on and attain dietary goals.;Weight Management/Obesity: Establish reasonable short term and long term weight goals.;Obesity: Provide education and appropriate resources to help participant work on and attain dietary goals.    Expected Outcomes Short Term: Continue to assess and modify interventions until short term weight is achieved;Long Term: Adherence to nutrition and physical activity/exercise program aimed toward attainment of established weight goal;Weight Maintenance: Understanding of the daily nutrition guidelines, which includes 25-35% calories from fat, 7% or less cal from saturated fats, less than 200mg  cholesterol, less than 1.5gm of sodium, & 5 or more servings of fruits and vegetables daily;Weight Loss: Understanding of general recommendations for a balanced deficit meal plan, which promotes 1-2 lb weight loss per week and includes a negative energy balance of [phone] kcal/d;Understanding recommendations for meals to include 15-35% energy as protein, 25-35% energy from fat, 35-60% energy from carbohydrates, less than 200mg  of dietary cholesterol, 20-35 gm of total fiber daily;Understanding of distribution of calorie intake throughout the day with the consumption of 4-5 meals/snacks    Improve shortness of breath with ADL's Yes    Intervention Provide education, individualized exercise plan and daily activity instruction to help decrease symptoms of SOB with activities of daily living.    Expected Outcomes Short Term: Improve cardiorespiratory fitness to achieve a reduction of symptoms when performing ADLs;Long Term: Be able to perform more ADLs without symptoms or delay  the onset of symptoms    Diabetes Yes    Intervention Provide education about signs/symptoms and action to take  for hypo/hyperglycemia.;Provide education about proper nutrition, including hydration, and aerobic/resistive exercise prescription along with prescribed medications to achieve blood glucose in normal ranges: Fasting glucose 65-99 mg/dL    Expected Outcomes Short Term: Participant verbalizes understanding of the signs/symptoms and immediate care of hyper/hypoglycemia, proper foot care and importance of medication, aerobic/resistive exercise and nutrition plan for blood glucose control.;Long Term: Attainment of HbA1C < 7%.    Hypertension Yes    Intervention Provide education on lifestyle modifcations including regular physical activity/exercise, weight management, moderate sodium restriction and increased consumption of fresh fruit, vegetables, and low fat dairy, alcohol moderation, and smoking cessation.;Monitor prescription use compliance.    Expected Outcomes Short Term: Continued assessment and intervention until BP is < 140/9mm HG in hypertensive participants. < 130/40mm HG in hypertensive participants with diabetes, heart failure or chronic kidney disease.;Long Term: Maintenance of blood pressure at goal levels.    Lipids Yes    Intervention Provide education and support for participant on nutrition & aerobic/resistive exercise along with prescribed medications to achieve LDL 70mg , HDL >40mg .    Expected Outcomes Short Term: Participant states understanding of desired cholesterol values and is compliant with medications prescribed. Participant is following exercise prescription and nutrition guidelines.;Long Term: Cholesterol controlled with medications as prescribed, with individualized exercise RX and with personalized nutrition plan. Value goals: LDL < 70mg , HDL > 40 mg.              Personal Goals Discharge:  Goals and Risk Factor Review     Row Name 01/23/21 0728              Core Components/Risk Factors/Patient Goals Review   Personal Goals Review Weight Management/Obesity;Diabetes       Review Patient is new to the program completed 3 sessions. 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 He has multiple risk factors for CAD and is participating in the program for risk modification. His personal goals for the program are to get stronger and be able to do his normal ADL's and be able to travel in his RV. We will continue to monitor his progress as he works towards meeting these goals.       Expected Outcomes Patient will complete the program meeting both program and personal goals.                Exercise Goals and Review:  Exercise Goals     Row Name 01/10/21 1017 01/28/21 1141           Exercise Goals   Increase Physical Activity Yes Yes      Intervention Provide advice, education, support and counseling about physical activity/exercise needs.;Develop an individualized exercise prescription for aerobic and resistive training based on initial evaluation findings, risk stratification, comorbidities and participant's personal goals. Provide advice, education, support and counseling about physical activity/exercise needs.;Develop an individualized exercise prescription for aerobic and resistive training based on initial evaluation findings, risk stratification, comorbidities and participant's personal goals.      Expected Outcomes Short Term: Attend rehab on a regular basis to increase amount of physical activity.;Long Term: Add in home exercise to make exercise part of routine and to increase amount of physical activity.;Long Term: Exercising regularly at least 3-5 days a week. Short Term: Attend rehab on a regular basis to increase amount of physical activity.;Long Term: Add in home exercise to make exercise part of routine and to increase amount of physical activity.;Long Term: Exercising regularly at least 3-5 days a week.  Increase Strength and Stamina Yes Yes       Intervention Provide advice, education, support and counseling about physical activity/exercise needs.;Develop an individualized exercise prescription for aerobic and resistive training based on initial evaluation findings, risk stratification, comorbidities and participant's personal goals. Provide advice, education, support and counseling about physical activity/exercise needs.;Develop an individualized exercise prescription for aerobic and resistive training based on initial evaluation findings, risk stratification, comorbidities and participant's personal goals.      Expected Outcomes Short Term: Increase workloads from initial exercise prescription for resistance, speed, and METs.;Short Term: Perform resistance training exercises routinely during rehab and add in resistance training at home;Long Term: Improve cardiorespiratory fitness, muscular endurance and strength as measured by increased METs and functional capacity (6MWT) Short Term: Increase workloads from initial exercise prescription for resistance, speed, and METs.;Short Term: Perform resistance training exercises routinely during rehab and add in resistance training at home;Long Term: Improve cardiorespiratory fitness, muscular endurance and strength as measured by increased METs and functional capacity (6MWT)      Able to understand and use rate of perceived exertion (RPE) scale Yes Yes      Intervention Provide education and explanation on how to use RPE scale Provide education and explanation on how to use RPE scale      Expected Outcomes Short Term: Able to use RPE daily in rehab to express subjective intensity level;Long Term:  Able to use RPE to guide intensity level when exercising independently Short Term: Able to use RPE daily in rehab to express subjective intensity level;Long Term:  Able to use RPE to guide intensity level when exercising independently      Knowledge and understanding of Target Heart Rate Range (THRR) Yes Yes       Intervention Provide education and explanation of THRR including how the numbers were predicted and where they are located for reference Provide education and explanation of THRR including how the numbers were predicted and where they are located for reference      Expected Outcomes Short Term: Able to state/look up THRR;Long Term: Able to use THRR to govern intensity when exercising independently;Short Term: Able to use daily as guideline for intensity in rehab Short Term: Able to state/look up THRR;Long Term: Able to use THRR to govern intensity when exercising independently;Short Term: Able to use daily as guideline for intensity in rehab      Able to check pulse independently Yes Yes      Intervention Provide education and demonstration on how to check pulse in carotid and radial arteries.;Review the importance of being able to check your own pulse for safety during independent exercise Provide education and demonstration on how to check pulse in carotid and radial arteries.;Review the importance of being able to check your own pulse for safety during independent exercise      Expected Outcomes Short Term: Able to explain why pulse checking is important during independent exercise;Long Term: Able to check pulse independently and accurately Short Term: Able to explain why pulse checking is important during independent exercise;Long Term: Able to check pulse independently and accurately      Understanding of Exercise Prescription Yes Yes      Intervention Provide education, explanation, and written materials on patient's individual exercise prescription Provide education, explanation, and written materials on patient's individual exercise prescription      Expected Outcomes Short Term: Able to explain program exercise prescription;Long Term: Able to explain home exercise prescription to exercise independently Short Term: Able to explain program exercise prescription;Long  Term: Able to explain home  exercise prescription to exercise independently               Exercise Goals Re-Evaluation:  Exercise Goals Re-Evaluation     [place] Name 01/28/21 1141             Exercise Goal Re-Evaluation   Exercise Goals Review Increase Physical Activity;Increase Strength and Stamina;Able to understand and use rate of perceived exertion (RPE) scale;Knowledge and understanding of Target Heart Rate Range (THRR);Able to check pulse independently;Understanding of Exercise Prescription       Comments Pt has completed 3 sessions of cardiac rehab. He has been absent for the past five sessions, with the last three being no call no shows. It is unclear if he will return or not. Will monitor and progress as able.       Expected Outcomes Through exercise at rehab and at home, the patient will meet their stated goals.                Nutrition & Weight - Outcomes:  Pre Biometrics - 01/10/21 1018       Pre Biometrics   Waist Circumference 46.5 inches    Hip Circumference 44 inches    Waist to Hip Ratio 1.06 %    Triceps Skinfold 8 mm    % Body Fat 27.1 %    Grip Strength 38.6 kg    Flexibility 0 in    Single Leg Stand 1.71 seconds              Nutrition:  Nutrition Therapy & Goals - 01/23/21 0726       Personal Nutrition Goals   Comments Patient scored 21 on his diet assessment. We offer 2 educational sessions on heart heatlhy nutrition with handouts and assistance with RD referral if patient is interested.      Intervention Plan   Intervention Nutrition handout(s) given to patient.             Nutrition Discharge:  Nutrition Assessments - 01/10/21 0820       MEDFICTS Scores   Pre Score 21             Education Questionnaire Score:  Knowledge Questionnaire Score - 01/10/21 0841       Knowledge Questionnaire Score   Pre Score 15/24             Pt has been discharged from cardiac rehab on 02/05/2021 after 3 sessions. He no called no showed 8 visits in a row,  so he has been discharged due to lack of attendance. Due to his short time in the program, no observable progress was made. It is unclear if he will continue to exercise on his own or not.

## 2021-02-05 NOTE — Addendum Note (Signed)
Encounter addended by: Philis Kendall on: 02/05/2021 1:07 PM  Actions taken: Episode resolved

## 2021-02-06 ENCOUNTER — Encounter (HOSPITAL_COMMUNITY): Payer: Medicare PPO

## 2021-02-08 ENCOUNTER — Encounter (HOSPITAL_COMMUNITY): Payer: Medicare PPO

## 2021-02-11 ENCOUNTER — Encounter (HOSPITAL_COMMUNITY): Payer: Medicare PPO

## 2021-02-13 ENCOUNTER — Encounter (HOSPITAL_COMMUNITY): Payer: Medicare PPO

## 2021-02-13 DIAGNOSIS — M62838 Other muscle spasm: Secondary | ICD-10-CM | POA: Diagnosis not present

## 2021-02-13 DIAGNOSIS — M5416 Radiculopathy, lumbar region: Secondary | ICD-10-CM | POA: Diagnosis not present

## 2021-02-13 DIAGNOSIS — M9905 Segmental and somatic dysfunction of pelvic region: Secondary | ICD-10-CM | POA: Diagnosis not present

## 2021-02-13 DIAGNOSIS — M5136 Other intervertebral disc degeneration, lumbar region: Secondary | ICD-10-CM | POA: Diagnosis not present

## 2021-02-13 DIAGNOSIS — M5432 Sciatica, left side: Secondary | ICD-10-CM | POA: Diagnosis not present

## 2021-02-13 DIAGNOSIS — M532X7 Spinal instabilities, lumbosacral region: Secondary | ICD-10-CM | POA: Diagnosis not present

## 2021-02-13 DIAGNOSIS — M4317 Spondylolisthesis, lumbosacral region: Secondary | ICD-10-CM | POA: Diagnosis not present

## 2021-02-13 DIAGNOSIS — M9903 Segmental and somatic dysfunction of lumbar region: Secondary | ICD-10-CM | POA: Diagnosis not present

## 2021-02-14 DIAGNOSIS — M4317 Spondylolisthesis, lumbosacral region: Secondary | ICD-10-CM | POA: Diagnosis not present

## 2021-02-14 DIAGNOSIS — M5432 Sciatica, left side: Secondary | ICD-10-CM | POA: Diagnosis not present

## 2021-02-14 DIAGNOSIS — M9905 Segmental and somatic dysfunction of pelvic region: Secondary | ICD-10-CM | POA: Diagnosis not present

## 2021-02-14 DIAGNOSIS — M9903 Segmental and somatic dysfunction of lumbar region: Secondary | ICD-10-CM | POA: Diagnosis not present

## 2021-02-14 DIAGNOSIS — M5136 Other intervertebral disc degeneration, lumbar region: Secondary | ICD-10-CM | POA: Diagnosis not present

## 2021-02-14 DIAGNOSIS — M62838 Other muscle spasm: Secondary | ICD-10-CM | POA: Diagnosis not present

## 2021-02-14 DIAGNOSIS — M532X7 Spinal instabilities, lumbosacral region: Secondary | ICD-10-CM | POA: Diagnosis not present

## 2021-02-14 DIAGNOSIS — M5416 Radiculopathy, lumbar region: Secondary | ICD-10-CM | POA: Diagnosis not present

## 2021-02-15 ENCOUNTER — Encounter (HOSPITAL_COMMUNITY): Payer: Medicare PPO

## 2021-02-18 ENCOUNTER — Encounter (HOSPITAL_COMMUNITY): Payer: Medicare PPO

## 2021-02-18 DIAGNOSIS — M5432 Sciatica, left side: Secondary | ICD-10-CM | POA: Diagnosis not present

## 2021-02-18 DIAGNOSIS — M4317 Spondylolisthesis, lumbosacral region: Secondary | ICD-10-CM | POA: Diagnosis not present

## 2021-02-18 DIAGNOSIS — M5416 Radiculopathy, lumbar region: Secondary | ICD-10-CM | POA: Diagnosis not present

## 2021-02-18 DIAGNOSIS — M9905 Segmental and somatic dysfunction of pelvic region: Secondary | ICD-10-CM | POA: Diagnosis not present

## 2021-02-18 DIAGNOSIS — M532X7 Spinal instabilities, lumbosacral region: Secondary | ICD-10-CM | POA: Diagnosis not present

## 2021-02-18 DIAGNOSIS — M62838 Other muscle spasm: Secondary | ICD-10-CM | POA: Diagnosis not present

## 2021-02-18 DIAGNOSIS — M9903 Segmental and somatic dysfunction of lumbar region: Secondary | ICD-10-CM | POA: Diagnosis not present

## 2021-02-18 DIAGNOSIS — M5136 Other intervertebral disc degeneration, lumbar region: Secondary | ICD-10-CM | POA: Diagnosis not present

## 2021-02-19 DIAGNOSIS — M5136 Other intervertebral disc degeneration, lumbar region: Secondary | ICD-10-CM | POA: Diagnosis not present

## 2021-02-19 DIAGNOSIS — M532X7 Spinal instabilities, lumbosacral region: Secondary | ICD-10-CM | POA: Diagnosis not present

## 2021-02-19 DIAGNOSIS — M9903 Segmental and somatic dysfunction of lumbar region: Secondary | ICD-10-CM | POA: Diagnosis not present

## 2021-02-19 DIAGNOSIS — M62838 Other muscle spasm: Secondary | ICD-10-CM | POA: Diagnosis not present

## 2021-02-19 DIAGNOSIS — M5416 Radiculopathy, lumbar region: Secondary | ICD-10-CM | POA: Diagnosis not present

## 2021-02-19 DIAGNOSIS — M4317 Spondylolisthesis, lumbosacral region: Secondary | ICD-10-CM | POA: Diagnosis not present

## 2021-02-19 DIAGNOSIS — M5432 Sciatica, left side: Secondary | ICD-10-CM | POA: Diagnosis not present

## 2021-02-19 DIAGNOSIS — M9905 Segmental and somatic dysfunction of pelvic region: Secondary | ICD-10-CM | POA: Diagnosis not present

## 2021-02-20 ENCOUNTER — Encounter (HOSPITAL_COMMUNITY): Payer: Medicare PPO

## 2021-02-20 DIAGNOSIS — M4317 Spondylolisthesis, lumbosacral region: Secondary | ICD-10-CM | POA: Diagnosis not present

## 2021-02-20 DIAGNOSIS — M9905 Segmental and somatic dysfunction of pelvic region: Secondary | ICD-10-CM | POA: Diagnosis not present

## 2021-02-20 DIAGNOSIS — M5136 Other intervertebral disc degeneration, lumbar region: Secondary | ICD-10-CM | POA: Diagnosis not present

## 2021-02-20 DIAGNOSIS — M5416 Radiculopathy, lumbar region: Secondary | ICD-10-CM | POA: Diagnosis not present

## 2021-02-20 DIAGNOSIS — M9903 Segmental and somatic dysfunction of lumbar region: Secondary | ICD-10-CM | POA: Diagnosis not present

## 2021-02-20 DIAGNOSIS — M532X7 Spinal instabilities, lumbosacral region: Secondary | ICD-10-CM | POA: Diagnosis not present

## 2021-02-20 DIAGNOSIS — M62838 Other muscle spasm: Secondary | ICD-10-CM | POA: Diagnosis not present

## 2021-02-20 DIAGNOSIS — M5432 Sciatica, left side: Secondary | ICD-10-CM | POA: Diagnosis not present

## 2021-02-21 DIAGNOSIS — C671 Malignant neoplasm of dome of bladder: Secondary | ICD-10-CM | POA: Diagnosis not present

## 2021-02-22 ENCOUNTER — Encounter (HOSPITAL_COMMUNITY): Payer: Medicare PPO

## 2021-02-25 ENCOUNTER — Encounter (HOSPITAL_COMMUNITY): Payer: Medicare PPO

## 2021-02-25 DIAGNOSIS — M4317 Spondylolisthesis, lumbosacral region: Secondary | ICD-10-CM | POA: Diagnosis not present

## 2021-02-25 DIAGNOSIS — M5416 Radiculopathy, lumbar region: Secondary | ICD-10-CM | POA: Diagnosis not present

## 2021-02-25 DIAGNOSIS — M62838 Other muscle spasm: Secondary | ICD-10-CM | POA: Diagnosis not present

## 2021-02-25 DIAGNOSIS — M5432 Sciatica, left side: Secondary | ICD-10-CM | POA: Diagnosis not present

## 2021-02-26 DIAGNOSIS — M4317 Spondylolisthesis, lumbosacral region: Secondary | ICD-10-CM | POA: Diagnosis not present

## 2021-02-26 DIAGNOSIS — M9905 Segmental and somatic dysfunction of pelvic region: Secondary | ICD-10-CM | POA: Diagnosis not present

## 2021-02-26 DIAGNOSIS — M532X7 Spinal instabilities, lumbosacral region: Secondary | ICD-10-CM | POA: Diagnosis not present

## 2021-02-26 DIAGNOSIS — M5432 Sciatica, left side: Secondary | ICD-10-CM | POA: Diagnosis not present

## 2021-02-26 DIAGNOSIS — M9903 Segmental and somatic dysfunction of lumbar region: Secondary | ICD-10-CM | POA: Diagnosis not present

## 2021-02-26 DIAGNOSIS — M62838 Other muscle spasm: Secondary | ICD-10-CM | POA: Diagnosis not present

## 2021-02-26 DIAGNOSIS — M5136 Other intervertebral disc degeneration, lumbar region: Secondary | ICD-10-CM | POA: Diagnosis not present

## 2021-02-26 DIAGNOSIS — M5416 Radiculopathy, lumbar region: Secondary | ICD-10-CM | POA: Diagnosis not present

## 2021-02-27 ENCOUNTER — Encounter (HOSPITAL_COMMUNITY): Payer: Medicare PPO

## 2021-02-27 DIAGNOSIS — M5136 Other intervertebral disc degeneration, lumbar region: Secondary | ICD-10-CM | POA: Diagnosis not present

## 2021-02-27 DIAGNOSIS — M62838 Other muscle spasm: Secondary | ICD-10-CM | POA: Diagnosis not present

## 2021-02-27 DIAGNOSIS — M9905 Segmental and somatic dysfunction of pelvic region: Secondary | ICD-10-CM | POA: Diagnosis not present

## 2021-02-27 DIAGNOSIS — M532X7 Spinal instabilities, lumbosacral region: Secondary | ICD-10-CM | POA: Diagnosis not present

## 2021-02-27 DIAGNOSIS — M5416 Radiculopathy, lumbar region: Secondary | ICD-10-CM | POA: Diagnosis not present

## 2021-02-27 DIAGNOSIS — M4317 Spondylolisthesis, lumbosacral region: Secondary | ICD-10-CM | POA: Diagnosis not present

## 2021-02-27 DIAGNOSIS — M5432 Sciatica, left side: Secondary | ICD-10-CM | POA: Diagnosis not present

## 2021-02-27 DIAGNOSIS — M9903 Segmental and somatic dysfunction of lumbar region: Secondary | ICD-10-CM | POA: Diagnosis not present

## 2021-02-28 DIAGNOSIS — M9905 Segmental and somatic dysfunction of pelvic region: Secondary | ICD-10-CM | POA: Diagnosis not present

## 2021-02-28 DIAGNOSIS — M5136 Other intervertebral disc degeneration, lumbar region: Secondary | ICD-10-CM | POA: Diagnosis not present

## 2021-02-28 DIAGNOSIS — M5432 Sciatica, left side: Secondary | ICD-10-CM | POA: Diagnosis not present

## 2021-02-28 DIAGNOSIS — M9903 Segmental and somatic dysfunction of lumbar region: Secondary | ICD-10-CM | POA: Diagnosis not present

## 2021-02-28 DIAGNOSIS — M5416 Radiculopathy, lumbar region: Secondary | ICD-10-CM | POA: Diagnosis not present

## 2021-02-28 DIAGNOSIS — M532X7 Spinal instabilities, lumbosacral region: Secondary | ICD-10-CM | POA: Diagnosis not present

## 2021-02-28 DIAGNOSIS — M4317 Spondylolisthesis, lumbosacral region: Secondary | ICD-10-CM | POA: Diagnosis not present

## 2021-02-28 DIAGNOSIS — M62838 Other muscle spasm: Secondary | ICD-10-CM | POA: Diagnosis not present

## 2021-03-01 ENCOUNTER — Encounter (HOSPITAL_COMMUNITY): Payer: Medicare PPO

## 2021-03-04 ENCOUNTER — Encounter (HOSPITAL_COMMUNITY): Payer: Medicare PPO

## 2021-03-06 ENCOUNTER — Encounter (HOSPITAL_COMMUNITY): Payer: Medicare PPO

## 2021-03-06 ENCOUNTER — Telehealth: Payer: Self-pay | Admitting: Cardiology

## 2021-03-06 ENCOUNTER — Other Ambulatory Visit: Payer: Self-pay | Admitting: *Deleted

## 2021-03-06 MED ORDER — APIXABAN 5 MG PO TABS: 5.0000 mg | ORAL_TABLET | Freq: Two times a day (BID) | ORAL | 1 refills | Status: DC

## 2021-03-06 NOTE — Telephone Encounter (Signed)
Done.  See refill note from today.

## 2021-03-06 NOTE — Telephone Encounter (Signed)
Prescription refill request for Eliquis received. Indication: Atrial Fib Last office visit: 12/17/20  Lawanda Cousins NP Scr: 1.29 on 12/01/20 Age: 80 Weight: 106.5kg  Based on above findings Eliquis '5mg'$  twice daily is the appropriate dose.  Refill approved.

## 2021-03-06 NOTE — Telephone Encounter (Signed)
New message      *STAT* If patient is at the pharmacy, call can be transferred to refill team.   1. Which medications need to be refilled? (please list name of each medication and dose if known)  eliquis  2. Which pharmacy/location (including street and city if local pharmacy) is medication to be sent to? [facility]  3. Do they need a 30 day or 90 day supply? [place]

## 2021-03-08 ENCOUNTER — Encounter (HOSPITAL_COMMUNITY): Payer: Medicare PPO

## 2021-03-11 ENCOUNTER — Encounter (HOSPITAL_COMMUNITY): Payer: Medicare PPO

## 2021-03-13 ENCOUNTER — Encounter (HOSPITAL_COMMUNITY): Payer: Medicare PPO

## 2021-03-15 ENCOUNTER — Encounter (HOSPITAL_COMMUNITY): Payer: Medicare PPO

## 2021-03-18 ENCOUNTER — Encounter (HOSPITAL_COMMUNITY): Payer: Medicare PPO

## 2021-03-20 ENCOUNTER — Encounter (HOSPITAL_COMMUNITY): Payer: Medicare PPO

## 2021-03-22 ENCOUNTER — Encounter (HOSPITAL_COMMUNITY): Payer: Medicare PPO

## 2021-03-25 ENCOUNTER — Encounter (HOSPITAL_COMMUNITY): Payer: Medicare PPO

## 2021-03-26 DIAGNOSIS — H401112 Primary open-angle glaucoma, right eye, moderate stage: Secondary | ICD-10-CM | POA: Diagnosis not present

## 2021-03-26 DIAGNOSIS — H40022 Open angle with borderline findings, high risk, left eye: Secondary | ICD-10-CM | POA: Diagnosis not present

## 2021-03-27 ENCOUNTER — Encounter (HOSPITAL_COMMUNITY): Payer: Medicare PPO

## 2021-03-28 DIAGNOSIS — I10 Essential (primary) hypertension: Secondary | ICD-10-CM | POA: Diagnosis not present

## 2021-03-28 DIAGNOSIS — Z6829 Body mass index (BMI) 29.0-29.9, adult: Secondary | ICD-10-CM | POA: Diagnosis not present

## 2021-03-28 DIAGNOSIS — Z0001 Encounter for general adult medical examination with abnormal findings: Secondary | ICD-10-CM | POA: Diagnosis not present

## 2021-03-28 DIAGNOSIS — Z1331 Encounter for screening for depression: Secondary | ICD-10-CM | POA: Diagnosis not present

## 2021-03-28 DIAGNOSIS — E1165 Type 2 diabetes mellitus with hyperglycemia: Secondary | ICD-10-CM | POA: Diagnosis not present

## 2021-03-28 DIAGNOSIS — E663 Overweight: Secondary | ICD-10-CM | POA: Diagnosis not present

## 2021-03-28 DIAGNOSIS — I2510 Atherosclerotic heart disease of native coronary artery without angina pectoris: Secondary | ICD-10-CM | POA: Diagnosis not present

## 2021-03-29 ENCOUNTER — Encounter (HOSPITAL_COMMUNITY): Payer: Medicare PPO

## 2021-04-01 ENCOUNTER — Encounter (HOSPITAL_COMMUNITY): Payer: Medicare PPO

## 2021-04-03 ENCOUNTER — Encounter (HOSPITAL_COMMUNITY): Payer: Medicare PPO

## 2021-04-05 ENCOUNTER — Encounter (HOSPITAL_COMMUNITY): Payer: Medicare PPO

## 2021-04-09 DIAGNOSIS — C671 Malignant neoplasm of dome of bladder: Secondary | ICD-10-CM | POA: Diagnosis not present

## 2021-04-22 DIAGNOSIS — H401112 Primary open-angle glaucoma, right eye, moderate stage: Secondary | ICD-10-CM | POA: Diagnosis not present

## 2021-04-22 DIAGNOSIS — N183 Chronic kidney disease, stage 3 unspecified: Secondary | ICD-10-CM | POA: Diagnosis not present

## 2021-04-22 DIAGNOSIS — I129 Hypertensive chronic kidney disease with stage 1 through stage 4 chronic kidney disease, or unspecified chronic kidney disease: Secondary | ICD-10-CM | POA: Diagnosis not present

## 2021-04-22 DIAGNOSIS — E1122 Type 2 diabetes mellitus with diabetic chronic kidney disease: Secondary | ICD-10-CM | POA: Diagnosis not present

## 2021-04-22 DIAGNOSIS — H2511 Age-related nuclear cataract, right eye: Secondary | ICD-10-CM | POA: Diagnosis not present

## 2021-04-22 DIAGNOSIS — Z7984 Long term (current) use of oral hypoglycemic drugs: Secondary | ICD-10-CM | POA: Diagnosis not present

## 2021-05-07 ENCOUNTER — Telehealth: Payer: Self-pay | Admitting: Cardiology

## 2021-05-07 MED ORDER — METOPROLOL TARTRATE 50 MG PO TABS: 50.0000 mg | ORAL_TABLET | Freq: Two times a day (BID) | ORAL | 2 refills | Status: DC

## 2021-05-07 NOTE — Telephone Encounter (Signed)
Prescription sent to [facility]

## 2021-05-07 NOTE — Telephone Encounter (Signed)
New message      *STAT* If patient is at the pharmacy, call can be transferred to refill team.   1. Which medications need to be refilled? (please list name of each medication and dose if known) metoprolol tartrate (LOPRESSOR) 50 MG tablet  2. Which pharmacy/location (including street and city if local pharmacy) is medication to be sent to? [facility] [facility]   3. Do they need a 30 day or 90 day supply? 90  

## 2021-06-03 ENCOUNTER — Other Ambulatory Visit: Payer: Self-pay | Admitting: Cardiology

## 2021-06-04 NOTE — Telephone Encounter (Signed)
Prescription refill request for Eliquis received.  Indication: afib  Last office visit: 12/17/2020, [place] Scr: 1.29, 12/01/2020 Age: 80 yo  Weight: 102.7 kg   Refill sent.

## 2021-06-10 DIAGNOSIS — H2512 Age-related nuclear cataract, left eye: Secondary | ICD-10-CM | POA: Diagnosis not present

## 2021-06-10 DIAGNOSIS — I129 Hypertensive chronic kidney disease with stage 1 through stage 4 chronic kidney disease, or unspecified chronic kidney disease: Secondary | ICD-10-CM | POA: Diagnosis not present

## 2021-06-10 DIAGNOSIS — N183 Chronic kidney disease, stage 3 unspecified: Secondary | ICD-10-CM | POA: Diagnosis not present

## 2021-06-10 DIAGNOSIS — Z7984 Long term (current) use of oral hypoglycemic drugs: Secondary | ICD-10-CM | POA: Diagnosis not present

## 2021-06-10 DIAGNOSIS — E1122 Type 2 diabetes mellitus with diabetic chronic kidney disease: Secondary | ICD-10-CM | POA: Diagnosis not present

## 2021-06-17 ENCOUNTER — Encounter: Payer: Self-pay | Admitting: Cardiology

## 2021-06-17 ENCOUNTER — Ambulatory Visit: Payer: Medicare PPO | Admitting: Cardiology

## 2021-06-17 VITALS — BP 134/66 | HR 77 | Ht 78.0 in | Wt 239.0 lb

## 2021-06-17 DIAGNOSIS — E782 Mixed hyperlipidemia: Secondary | ICD-10-CM

## 2021-06-17 DIAGNOSIS — I2510 Atherosclerotic heart disease of native coronary artery without angina pectoris: Secondary | ICD-10-CM

## 2021-06-17 DIAGNOSIS — I10 Essential (primary) hypertension: Secondary | ICD-10-CM | POA: Diagnosis not present

## 2021-06-17 DIAGNOSIS — I4891 Unspecified atrial fibrillation: Secondary | ICD-10-CM | POA: Diagnosis not present

## 2021-06-17 DIAGNOSIS — I9789 Other postprocedural complications and disorders of the circulatory system, not elsewhere classified: Secondary | ICD-10-CM

## 2021-06-17 NOTE — Addendum Note (Signed)
Addended by: Laurine Blazer on: 06/17/2021 02:34 PM   Modules accepted: Orders

## 2021-06-17 NOTE — Patient Instructions (Signed)
Medication Instructions:  Stop Eliquis (Apixaban) Continue all other medications.     Labwork: none  Testing/Procedures: none  Follow-Up: Your physician wants you to follow up in: 6 months.  You will receive a reminder letter in the mail one-two months in advance.  If you don't receive a letter, please call our office to schedule the follow up appointment   Any Other Special Instructions Will Be Listed Below (If Applicable).   If you need a refill on your cardiac medications before your next appointment, please call your pharmacy.

## 2021-06-17 NOTE — Progress Notes (Signed)
Clinical Summary Mr. Dripps is a 80 y.o.male seen today for follow up of the following medical problems.    1. CAD - history of RCA stent in 2003 - admitted 11/2020 with unstable angina - 11/2020 cath: 60% LM with 99% branching into osital LCX, ostial LAD 45%, LCX mild disease, RCA mid 70% 11/2020 echo: LVEF 55-60%, grade I dd, normal RV  - 11/26/20 CABG x3 with LIMA-LAD, SVT-PDA, SVG-OM  - some ongoing chest soreness at surgical site - no cardiac chest pain. No SOB/DOE. Back to doing yard work, tolerating well.  - compliant with meds   2.Postop afib - developed after CABG, was started on amio and eliquis.   -amio was stopped 12/06/20 visit with CT surgery, remains on eliquis - no recent palpitations    2. HTN - compliant with meds   3. Hyperlipidemia - compliant with statin.  - labs followed pcp - lipitor caused itching.   11/2020 TC 105 TG 131 LDL 54 HDL 25     Past Medical History:  Diagnosis Date   Benign localized prostatic hyperplasia with lower urinary tract symptoms (LUTS)    Bladder cancer [facility])    urologist--- dr winter---  s/p  TURBT 09/ 2020   CKD (chronic kidney disease), stage III ([place])    neprologist--- dr Lowanda Foster  (12-15-2019  per pt was released to pcp at [facility] 02-06-2018 scanned in epic under media   Coronary artery disease cardiologist--- dr Bronson Ing   hx MI in 2003--- 08-02-2002  s/p cardiac cath with PCI and DES to [facility] and PDA;  cath 10-04-2002 patent stents w/ 30-40% AV groove Cx with ef 50% and mild inferior hypokinesis   COVID-19 12/16/2019   asymptomatic   Full dentures    Glaucoma, right eye    History of basal cell carcinoma (BCC) excision    History of gout    per pt one episode 01/ 2021    History of kidney stones    History of MI (myocardial infarction) 08/01/2002   History of ulcer of large intestine    per pt approx. 2002 told due to oral voltaren , colonoscopy/ egd with no intervention,  but did have blood transfusions    Hyperlipidemia    Hypertension    followed by pcp   (nuclear stress test in epic 05-11-2018  low risk w/ no ischemia, nuclear ef 55%)   IDA (iron deficiency anemia)    Nephrolithiasis    Non-insulin dependent type 2 diabetes mellitus ([place])    followed by pcp  (12-15-2019   per pt checks blood sugar 3 times a week,  fasting sugar-- 120-140)   S/P drug eluting coronary stent placement 08/02/2002   to proximal RCA and PDA   Wears glasses      Allergies  Allergen Reactions   Lipitor [Atorvastatin]     pruritis   Voltaren [Diclofenac Sodium] Other (See Comments)    "caused intestine ulcers with bleeding"   Penicillins Rash    Has patient had a PCN reaction causing immediate rash, facial/tongue/throat swelling, SOB or lightheadedness with hypotension: No Has patient had a PCN reaction causing severe rash involving mucus membranes or skin necrosis: No Has patient had a PCN reaction that required hospitalization: No Has patient had a PCN reaction occurring within the last 10 years: No  Reaction from injection as child- extensive rash per patient     Current Outpatient Medications  Medication Sig Dispense Refill   apixaban (ELIQUIS) 5 MG TABS tablet  TAKE (1) TABLET BY MOUTH TWICE DAILY. 180 tablet 1   aspirin EC 81 MG tablet Take 81 mg by mouth daily.     Cholecalciferol (VITAMIN D-3) 125 MCG (5000 UT) TABS Take 1 tablet by mouth at bedtime.      Cinnamon 500 MG TABS Take 1 tablet by mouth 2 (two) times daily.      diclofenac Sodium (VOLTAREN) 1 % GEL Apply small amount to affected area 2 g 0   dorzolamide-timolol (COSOPT) 22.3-6.8 MG/ML ophthalmic solution Place 1 drop into the right eye in the morning and at bedtime.     enalapril (VASOTEC) 2.5 MG tablet Take 2.5 mg by mouth daily.      metFORMIN (GLUCOPHAGE) 500 MG tablet Take 1,000 mg by mouth 2 (two) times daily with a meal.     metoprolol tartrate (LOPRESSOR) 50 MG tablet Take 1 tablet (50 mg total) by mouth 2 (two) times daily.  60 tablet 2   Omega-3 Fatty Acids (FISH OIL PO) Take 1 tablet by mouth in the morning and at bedtime.     ondansetron (ZOFRAN) 4 MG tablet Take 1 tablet (4 mg total) by mouth daily as needed for nausea or vomiting. 30 tablet 1   simvastatin (ZOCOR) 20 MG tablet Take 1 tablet (20 mg total) by mouth every evening. 30 tablet 2   tamsulosin (FLOMAX) 0.4 MG CAPS capsule Take 0.4 mg by mouth in the morning and at bedtime.     zinc gluconate 50 MG tablet Take 50 mg by mouth daily.     No current facility-administered medications for this visit.     Past Surgical History:  Procedure Laterality Date   BIOPSY  07/13/2018   Procedure: BIOPSY;  Surgeon: Daneil Dolin, MD;  [facility];  Service: Endoscopy;;  (gastric)   CARDIAC CATHETERIZATION  08/02/2002   dr berry   Crux of RCA 95% stenosis, stented with a 2.5x54mm CYPHER stent resulting in reduction of 95% stenosis to 0% residual; overlapping CYPHER stents -3x24mm and 3x49mm- deployed at 15atm resulting in reduction of 70% stenosis to 0% residual.   CARDIAC CATHETERIZATION  10/04/2002    dr berry   patent stents w/ 30-40% AV groove LCx , mild inferior hypokinesis, ef 50%   COLONOSCOPY N/A 05/31/2014   Dr. Gala Romney: Normal   COLONOSCOPY N/A 07/13/2018   Procedure: COLONOSCOPY;  Surgeon: Daneil Dolin, MD;  [facility];  Service: Endoscopy;  Laterality: N/A;  8:30am   CORONARY ARTERY BYPASS GRAFT N/A 11/26/2020   Procedure: CORONARY ARTERY BYPASS GRAFTING (CABG) TIMES 3 ON PUMP USING LEFT INTERNAL MAMMARY ARTERY AND ENDOSCOPICALLY HARVESTED RIGHT GREATER SAPHENOUS VEIN;  Surgeon: Wonda Olds, MD;  Location: [place];  Service: Open Heart Surgery;  Laterality: N/A;   ENDOVEIN HARVEST OF GREATER SAPHENOUS VEIN Right 11/26/2020   Procedure: ENDOVEIN HARVEST OF GREATER SAPHENOUS VEIN;  Surgeon: Wonda Olds, MD;  Location: [place];  Service: Open Heart Surgery;  Laterality: Right;   ESOPHAGOGASTRODUODENOSCOPY N/A 07/13/2018    Procedure: ESOPHAGOGASTRODUODENOSCOPY (EGD);  Surgeon: Daneil Dolin, MD;  [facility];  Service: Endoscopy;  Laterality: N/A;   LEFT HEART CATH AND CORONARY ANGIOGRAPHY N/A 11/22/2020   Procedure: LEFT HEART CATH AND CORONARY ANGIOGRAPHY;  Surgeon: Leonie Man, MD;  Location: [place] [facility];  Service: Cardiovascular;  Laterality: N/A;   TEE WITHOUT CARDIOVERSION N/A 11/26/2020   Procedure: TRANSESOPHAGEAL ECHOCARDIOGRAM (TEE);  Surgeon: Wonda Olds, MD;  Location: [place];  Service: Open Heart Surgery;  Laterality: N/A;   TRANSURETHRAL RESECTION OF BLADDER TUMOR N/A 04/22/2019   Procedure: TRANSURETHRAL RESECTION OF BLADDER TUMOR (TURBT)/ CYSTOSCOPY BILATERAL RETROGRADE PYELOGRAMS,  GEMCITABINE INSTILLATION;  Surgeon: Ceasar Mons, MD;  Location: [facility];  Service: Urology;  Laterality: N/A;  ONLY NEEDS 45 MIN   TRANSURETHRAL RESECTION OF BLADDER TUMOR N/A 01/25/2020   Procedure: TRANSURETHRAL RESECTION OF BLADDER TUMOR (TURBT)/ CYSTOSCOPY;  Surgeon: Ceasar Mons, MD;  Location: [facility];  Service: Urology;  Laterality: N/A;  ONLY NEEDS 30 MIN     Allergies  Allergen Reactions   Lipitor [Atorvastatin]     pruritis   Voltaren [Diclofenac Sodium] Other (See Comments)    "caused intestine ulcers with bleeding"   Penicillins Rash    Has patient had a PCN reaction causing immediate rash, facial/tongue/throat swelling, SOB or lightheadedness with hypotension: No Has patient had a PCN reaction causing severe rash involving mucus membranes or skin necrosis: No Has patient had a PCN reaction that required hospitalization: No Has patient had a PCN reaction occurring within the last 10 years: No  Reaction from injection as child- extensive rash per patient      Family History  Problem Relation Age of Onset   Stroke Mother    Diabetes Mother    Colon cancer Neg Hx      Social History Mr. Garriga reports  that he quit smoking about 37 years ago. His smoking use included cigarettes. He started smoking about 63 years ago. He has a 42.00 pack-year smoking history. He quit smokeless tobacco use about 8 years ago.  His smokeless tobacco use included chew. Mr. Rod reports no history of alcohol use.   Review of Systems CONSTITUTIONAL: No weight loss, fever, chills, weakness or fatigue.  HEENT: Eyes: No visual loss, blurred vision, double vision or yellow sclerae.No hearing loss, sneezing, congestion, runny nose or sore throat.  SKIN: No rash or itching.  CARDIOVASCULAR: per hpi RESPIRATORY: No shortness of breath, cough or sputum.  GASTROINTESTINAL: No anorexia, nausea, vomiting or diarrhea. No abdominal pain or blood.  GENITOURINARY: No burning on urination, no polyuria NEUROLOGICAL: No headache, dizziness, syncope, paralysis, ataxia, numbness or tingling in the extremities. No change in bowel or bladder control.  MUSCULOSKELETAL: No muscle, back pain, joint pain or stiffness.  LYMPHATICS: No enlarged nodes. No history of splenectomy.  PSYCHIATRIC: No history of depression or anxiety.  ENDOCRINOLOGIC: No reports of sweating, cold or heat intolerance. No polyuria or polydipsia.  Marland Kitchen   Physical Examination Today's Vitals   06/17/21 1339  BP: 134/66  Pulse: 77  SpO2: 96%  Weight: 239 lb (108.4 kg)  Height: 6\' 6"  (1.981 m)   Body mass index is 27.62 kg/m.  Gen: resting comfortably, no acute distress HEENT: no scleral icterus, pupils equal round and reactive, no palptable cervical adenopathy,  CV: RRR, no mr/g, no jvd Resp: Clear to auscultation bilaterally GI: abdomen is soft, non-tender, non-distended, normal bowel sounds, no hepatosplenomegaly MSK: extremities are warm, no edema.  Skin: warm, no rash Neuro:  no focal deficits Psych: appropriate affect   Diagnostic Studies  Stress test 05/11/18:   Blood pressure demonstrated a hypertensive response to exercise. There was no ST  segment deviation noted during stress. Defect 1: There is a medium defect of moderate severity present in the mid inferoseptal, mid inferior and apical inferior location. This is consistent with myocardial scar as well as overlying soft tissue attenuation. There are no significant ischemic  territories. This is a low risk study. Nuclear stress EF: 55%.   11/2020 cath LV end diastolic pressure is normal. There is no aortic valve stenosis. Prox RCA to Mid RCA lesion is 10% stenosed. Mid RCA lesion is 70% stenosed. Dist RCA lesion is 60% stenosed. Mid LM to Ost LAD lesion is 60% stenosed with 99% stenosed side Magdala Brahmbhatt in Ost Cx. Ost LAD to Mid LAD lesion is 45% stenosed.   SUMMARY Severe multivessel CAD: Distal left main 55-60% involving 99% ostial LCx and ostial high OM1/RI; LCx otherwise has mild proximal and mid disease terminating as major branching lateral a OM Gregg Holster 40% proximal LAD with relatively minimal disease throughout the remainder the LAD and major 1st Diag Tandem mid and distal RCA lesions of 70% and 60% Normal EDP.  Normal EF by echo     POST CATH RECOMMENDATIONS Return to nursing unit for ongoing post-cath care.   Restart IV heparin 8 hours post sheath removal. Patient has surgical disease with the distal left main and ostial LCx.  Will consult CVTS in the morning. PCI options would be high risk given the sharp angle takeoff of the circumflex, calcified ostial lesion with a sidebranch (high OM/RI).  Would need to stent across the LCx into the LAD as well (DK crush versus DK culotte technique would be required) Continue aggressive risk modification.   11/2020 echo IMPRESSIONS     1. Left ventricular ejection fraction, by estimation, is 55 to 60%. The  left ventricle has normal function. The left ventricle has no regional  wall motion abnormalities. Left ventricular diastolic parameters are  consistent with Grade I diastolic  dysfunction (impaired relaxation). The  average left ventricular global  longitudinal strain is 20.1 %. The global longitudinal strain is normal.   2. Right ventricular systolic function is normal. The right ventricular  size is normal. There is normal pulmonary artery systolic pressure. The  estimated right ventricular systolic pressure is 60.4 mmHg.   3. The mitral valve is normal in structure. No evidence of mitral valve  regurgitation. No evidence of mitral stenosis.   4. The aortic valve is tricuspid. Aortic valve regurgitation is not  visualized. No aortic stenosis is present.   5. Aortic dilatation noted. There is mild dilatation of the ascending  aorta, measuring 37 mm.   6. The inferior vena cava is normal in size with greater than 50%  respiratory variability, suggesting right atrial pressure of 3 mmHg.     Assessment and Plan   1. CAD - no recent cardiac symptoms, some ongoing soreness at surgical site at time that is slowly improving - continue current meds   2. HTN - he is at goal, continue current meds   3. Hyperlipidemia - lipids have been at goal, continue simvastatin. Request pcp labs.  4.Postop afib - no evidence of recurrence, can d/c eliquis EKG today shows NSR  F.u 6 months   Arnoldo Lenis, M.D.

## 2021-06-17 NOTE — Addendum Note (Signed)
Addended by: Laurine Blazer on: 06/17/2021 04:55 PM   Modules accepted: Orders

## 2021-06-18 ENCOUNTER — Encounter: Payer: Self-pay | Admitting: *Deleted

## 2021-07-01 ENCOUNTER — Other Ambulatory Visit: Payer: Self-pay | Admitting: Cardiology

## 2021-07-03 DIAGNOSIS — Z961 Presence of intraocular lens: Secondary | ICD-10-CM | POA: Diagnosis not present

## 2021-07-03 DIAGNOSIS — H524 Presbyopia: Secondary | ICD-10-CM | POA: Diagnosis not present

## 2021-07-03 DIAGNOSIS — H52223 Regular astigmatism, bilateral: Secondary | ICD-10-CM | POA: Diagnosis not present

## 2021-07-18 DIAGNOSIS — Z8551 Personal history of malignant neoplasm of bladder: Secondary | ICD-10-CM | POA: Diagnosis not present

## 2021-08-14 DIAGNOSIS — C671 Malignant neoplasm of dome of bladder: Secondary | ICD-10-CM | POA: Diagnosis not present

## 2021-08-14 DIAGNOSIS — Z5111 Encounter for antineoplastic chemotherapy: Secondary | ICD-10-CM | POA: Diagnosis not present

## 2021-08-21 DIAGNOSIS — I951 Orthostatic hypotension: Secondary | ICD-10-CM | POA: Diagnosis not present

## 2021-08-21 DIAGNOSIS — N400 Enlarged prostate without lower urinary tract symptoms: Secondary | ICD-10-CM | POA: Diagnosis not present

## 2021-08-21 DIAGNOSIS — C671 Malignant neoplasm of dome of bladder: Secondary | ICD-10-CM | POA: Diagnosis not present

## 2021-08-21 DIAGNOSIS — E119 Type 2 diabetes mellitus without complications: Secondary | ICD-10-CM | POA: Diagnosis not present

## 2021-08-21 DIAGNOSIS — I2510 Atherosclerotic heart disease of native coronary artery without angina pectoris: Secondary | ICD-10-CM | POA: Diagnosis not present

## 2021-08-21 DIAGNOSIS — Z5111 Encounter for antineoplastic chemotherapy: Secondary | ICD-10-CM | POA: Diagnosis not present

## 2021-08-21 DIAGNOSIS — I10 Essential (primary) hypertension: Secondary | ICD-10-CM | POA: Diagnosis not present

## 2021-08-21 DIAGNOSIS — I252 Old myocardial infarction: Secondary | ICD-10-CM | POA: Diagnosis not present

## 2021-08-21 DIAGNOSIS — N529 Male erectile dysfunction, unspecified: Secondary | ICD-10-CM | POA: Diagnosis not present

## 2021-08-21 DIAGNOSIS — C679 Malignant neoplasm of bladder, unspecified: Secondary | ICD-10-CM | POA: Diagnosis not present

## 2021-08-21 DIAGNOSIS — E785 Hyperlipidemia, unspecified: Secondary | ICD-10-CM | POA: Diagnosis not present

## 2021-08-28 DIAGNOSIS — Z5111 Encounter for antineoplastic chemotherapy: Secondary | ICD-10-CM | POA: Diagnosis not present

## 2021-08-28 DIAGNOSIS — C671 Malignant neoplasm of dome of bladder: Secondary | ICD-10-CM | POA: Diagnosis not present

## 2021-11-11 DIAGNOSIS — E1165 Type 2 diabetes mellitus with hyperglycemia: Secondary | ICD-10-CM | POA: Diagnosis not present

## 2021-11-11 DIAGNOSIS — N183 Chronic kidney disease, stage 3 unspecified: Secondary | ICD-10-CM | POA: Diagnosis not present

## 2021-11-11 DIAGNOSIS — E119 Type 2 diabetes mellitus without complications: Secondary | ICD-10-CM | POA: Diagnosis not present

## 2021-11-11 DIAGNOSIS — H9313 Tinnitus, bilateral: Secondary | ICD-10-CM | POA: Diagnosis not present

## 2021-11-11 DIAGNOSIS — E663 Overweight: Secondary | ICD-10-CM | POA: Diagnosis not present

## 2021-11-11 DIAGNOSIS — Z6829 Body mass index (BMI) 29.0-29.9, adult: Secondary | ICD-10-CM | POA: Diagnosis not present

## 2021-11-12 DIAGNOSIS — H40022 Open angle with borderline findings, high risk, left eye: Secondary | ICD-10-CM | POA: Diagnosis not present

## 2021-11-12 DIAGNOSIS — H401112 Primary open-angle glaucoma, right eye, moderate stage: Secondary | ICD-10-CM | POA: Diagnosis not present

## 2021-11-12 DIAGNOSIS — H26493 Other secondary cataract, bilateral: Secondary | ICD-10-CM | POA: Diagnosis not present

## 2021-11-12 DIAGNOSIS — H26492 Other secondary cataract, left eye: Secondary | ICD-10-CM | POA: Diagnosis not present

## 2021-11-12 DIAGNOSIS — H26491 Other secondary cataract, right eye: Secondary | ICD-10-CM | POA: Diagnosis not present

## 2021-11-15 ENCOUNTER — Emergency Department (HOSPITAL_COMMUNITY)
Admission: EM | Admit: 2021-11-15 | Discharge: 2021-11-15 | Disposition: A | Discharge status: Home / Self Care | Payer: Medicare PPO | Attending: Emergency Medicine | Admitting: Emergency Medicine

## 2021-11-15 ENCOUNTER — Encounter (HOSPITAL_COMMUNITY): Payer: Self-pay | Admitting: Emergency Medicine

## 2021-11-15 ENCOUNTER — Emergency Department (HOSPITAL_COMMUNITY): Payer: Medicare PPO

## 2021-11-15 ENCOUNTER — Other Ambulatory Visit: Payer: Self-pay

## 2021-11-15 DIAGNOSIS — Z951 Presence of aortocoronary bypass graft: Secondary | ICD-10-CM | POA: Insufficient documentation

## 2021-11-15 DIAGNOSIS — R519 Headache, unspecified: Secondary | ICD-10-CM | POA: Diagnosis not present

## 2021-11-15 DIAGNOSIS — I2510 Atherosclerotic heart disease of native coronary artery without angina pectoris: Secondary | ICD-10-CM | POA: Diagnosis not present

## 2021-11-15 DIAGNOSIS — R0789 Other chest pain: Secondary | ICD-10-CM | POA: Diagnosis not present

## 2021-11-15 DIAGNOSIS — I10 Essential (primary) hypertension: Secondary | ICD-10-CM

## 2021-11-15 DIAGNOSIS — R251 Tremor, unspecified: Secondary | ICD-10-CM | POA: Diagnosis not present

## 2021-11-15 DIAGNOSIS — Z7982 Long term (current) use of aspirin: Secondary | ICD-10-CM | POA: Diagnosis not present

## 2021-11-15 DIAGNOSIS — Z79899 Other long term (current) drug therapy: Secondary | ICD-10-CM | POA: Insufficient documentation

## 2021-11-15 DIAGNOSIS — R7309 Other abnormal glucose: Secondary | ICD-10-CM | POA: Diagnosis not present

## 2021-11-15 DIAGNOSIS — R079 Chest pain, unspecified: Secondary | ICD-10-CM

## 2021-11-15 LAB — URINALYSIS, ROUTINE W REFLEX MICROSCOPIC
Bacteria, UA: NONE SEEN
Bilirubin Urine: NEGATIVE
Glucose, UA: NEGATIVE mg/dL
Ketones, ur: NEGATIVE mg/dL
Leukocytes,Ua: NEGATIVE
Nitrite: NEGATIVE
Protein, ur: 100 mg/dL — AB
Specific Gravity, Urine: 1.017 (ref 1.005–1.030)
pH: 7 (ref 5.0–8.0)

## 2021-11-15 LAB — CBC
HCT: 37 % — ABNORMAL LOW (ref 39.0–52.0)
Hemoglobin: 11.2 g/dL — ABNORMAL LOW (ref 13.0–17.0)
MCH: 25.3 pg — ABNORMAL LOW (ref 26.0–34.0)
MCHC: 30.3 g/dL (ref 30.0–36.0)
MCV: 83.5 fL (ref 80.0–100.0)
Platelets: 316 10*3/uL (ref 150–400)
RBC: 4.43 MIL/uL (ref 4.22–5.81)
RDW: 14.3 % (ref 11.5–15.5)
WBC: 16.8 10*3/uL — ABNORMAL HIGH (ref 4.0–10.5)
nRBC: 0 % (ref 0.0–0.2)

## 2021-11-15 LAB — BASIC METABOLIC PANEL
Anion gap: 12 (ref 5–15)
BUN: 20 mg/dL (ref 8–23)
CO2: 22 mmol/L (ref 22–32)
Calcium: 9.5 mg/dL (ref 8.9–10.3)
Chloride: 105 mmol/L (ref 98–111)
Creatinine, Ser: 1.22 mg/dL (ref 0.61–1.24)
GFR, Estimated: >60 mL/min (ref >60)
Glucose, Bld: 196 mg/dL — ABNORMAL HIGH (ref 70–99)
Potassium: 4.1 mmol/L (ref 3.5–5.1)
Sodium: 139 mmol/L (ref 135–145)

## 2021-11-15 LAB — TROPONIN I (HIGH SENSITIVITY)
Troponin I (High Sensitivity): 8 ng/L (ref <18)
Troponin I (High Sensitivity): 10 ng/L (ref <18)

## 2021-11-15 LAB — CBG MONITORING, ED: Glucose-Capillary: 150 mg/dL — ABNORMAL HIGH (ref 70–99)

## 2021-11-15 MED ORDER — ONDANSETRON HCL 4 MG/2ML IJ SOLN
4.0000 mg | Freq: Once | INTRAMUSCULAR | Status: AC
  Administered 2021-11-15: 4 mg via INTRAVENOUS
  Filled 2021-11-15: qty 2

## 2021-11-15 MED ORDER — ACETAMINOPHEN 325 MG PO TABS
650.0000 mg | ORAL_TABLET | Freq: Once | ORAL | Status: AC
  Administered 2021-11-15: 650 mg via ORAL
  Filled 2021-11-15: qty 2

## 2021-11-15 NOTE — ED Notes (Signed)
Patients family approached this nurse with concern for UTI. Per daughter patient was asking about "the poster with the ball game in the ED." No poster with ball game noted. EDP made aware. Verbal order obtained for UA. ?

## 2021-11-15 NOTE — ED Triage Notes (Signed)
Pt reports feeling shaky with high blood pressure this am. Also c/o pain throat.  ?

## 2021-11-15 NOTE — Discharge Instructions (Addendum)
Continue your current medications.  Return to the ER if you start having fever or recurrent chest pain or other concerning symptoms.  Follow-up with your cardiologist next week to be rechecked ?

## 2021-11-15 NOTE — ED Provider Notes (Signed)
?[facility] ?Provider Note ? ? ?CSN: [id number] ?Arrival date & time: 11/15/21  1004 ? ?  ? ?History ? ?Chief Complaint  ?Patient presents with  ? Hypertension  ? ? ?Craig Taylor is a 81 y.o. male. ? ? ?Hypertension ? ? ? Pt woke up this am and went to the bathroom.  He started shaking all over.  He began feeling weak and developed a headache. His blood pressure was elevated in the 200s.  No CP but he does have a pressure sensation in his throat. .  The headache has mostly resolved.  No fever.  No vomiting or diarrhea.  No stomach pain.  He does have nausea.  No dysuria.  ? ?Home Medications ?Prior to Admission medications   ?Medication Sig Start Date End Date Taking? Authorizing Provider  ?acetaminophen (TYLENOL) 500 MG tablet Take 500 mg by mouth every 6 (six) hours as needed for mild pain or headache.   Yes [provider]  ?aspirin EC 81 MG tablet Take 81 mg by mouth at bedtime.   Yes [provider]  ?enalapril (VASOTEC) 2.5 MG tablet Take 2.5 mg by mouth at bedtime.   Yes [provider]  ?metFORMIN (GLUCOPHAGE) 500 MG tablet Take 1,000 mg by mouth 2 (two) times daily with a meal.   Yes [provider]  ?metoprolol tartrate (LOPRESSOR) 50 MG tablet TAKE (1) TABLET BY MOUTH TWICE DAILY. ?Patient taking differently: Take 50 mg by mouth 2 (two) times daily. 07/01/21  Yes Branch, Alphonse Guild, MD  ?Misc Natural Products (IMMUNE ESSENTIALS) CAPS Take 1 capsule by mouth at bedtime.   Yes [provider]  ?Omega 3-6-9 Fatty Acids (OMEGA 3-6-9 COMPLEX PO) Take 1 capsule by mouth daily.   Yes [provider]  ?ondansetron (ZOFRAN) 4 MG tablet Take 1 tablet (4 mg total) by mouth daily as needed for nausea or vomiting. 12/06/20 12/06/21 Yes Wonda Olds, MD  ?simvastatin (ZOCOR) 40 MG tablet Take 40 mg by mouth at bedtime. 09/16/21  Yes [provider]  ?tamsulosin (FLOMAX) 0.4 MG CAPS capsule Take 0.4 mg by mouth in the morning and at  bedtime. 07/22/19  Yes [provider]  ?simvastatin (ZOCOR) 20 MG tablet Take 1 tablet (20 mg total) by mouth every evening. ?Patient not taking: Reported on 11/15/2021 12/01/20   Antony Odea, PA-C  ?   ? ?Allergies    ?Lipitor [atorvastatin], Voltaren [diclofenac sodium], and Penicillins   ? ?Review of Systems   ?Review of Systems  ?All other systems reviewed and are negative. ? ?Physical Exam ?Updated Vital Signs ?BP 130/78   Pulse 92   Temp 98.5 ?F (36.9 ?C) (Oral)   Resp (!) 25   Ht 1.981 m ('6\' 6"'$ )   Wt 109.8 kg   SpO2 92%   BMI 27.97 kg/m?  ?Physical Exam ?Vitals and nursing note reviewed.  ?Constitutional:   ?   General: He is not in acute distress. ?   Appearance: He is well-developed.  ?HENT:  ?   Head: Normocephalic and atraumatic.  ?   Right Ear: External ear normal.  ?   Left Ear: External ear normal.  ?Eyes:  ?   General: No scleral icterus.    ?   Right eye: No discharge.     ?   Left eye: No discharge.  ?   Conjunctiva/sclera: Conjunctivae normal.  ?Neck:  ?   Trachea: No tracheal deviation.  ?Cardiovascular:  ?   Rate and  Rhythm: Normal rate and regular rhythm.  ?Pulmonary:  ?   Effort: Pulmonary effort is normal. No respiratory distress.  ?   Breath sounds: Normal breath sounds. No stridor. No wheezing or rales.  ?Abdominal:  ?   General: Bowel sounds are normal. There is no distension.  ?   Palpations: Abdomen is soft.  ?   Tenderness: There is no abdominal tenderness. There is no guarding or rebound.  ?Musculoskeletal:     ?   General: No tenderness or deformity.  ?   Cervical back: Neck supple.  ?Skin: ?   General: Skin is warm and dry.  ?   Findings: No rash.  ?Neurological:  ?   General: No focal deficit present.  ?   Mental Status: He is alert.  ?   Cranial Nerves: No cranial nerve deficit (no facial droop, extraocular movements intact, no slurred speech).  ?   Sensory: No sensory deficit.  ?   Motor: No abnormal muscle tone or seizure activity.  ?   Coordination:  Coordination normal.  ?Psychiatric:     ?   Mood and Affect: Mood normal.  ? ? ?ED Results / Procedures / Treatments   ?Labs ?(all labs ordered are listed, but only abnormal results are displayed) ?Labs Reviewed  ?BASIC METABOLIC PANEL - Abnormal; Notable for the following components:  ?    Result Value  ? Glucose, Bld 196 (*)   ? All other components within normal limits  ?CBC - Abnormal; Notable for the following components:  ? WBC 16.8 (*)   ? Hemoglobin 11.2 (*)   ? HCT 37.0 (*)   ? MCH 25.3 (*)   ? All other components within normal limits  ?URINALYSIS, ROUTINE W REFLEX MICROSCOPIC - Abnormal; Notable for the following components:  ? Hgb urine dipstick SMALL (*)   ? Protein, ur 100 (*)   ? All other components within normal limits  ?CBG MONITORING, ED - Abnormal; Notable for the following components:  ? Glucose-Capillary 150 (*)   ? All other components within normal limits  ?TROPONIN I (HIGH SENSITIVITY)  ?TROPONIN I (HIGH SENSITIVITY)  ? ? ?EKG ?EKG Interpretation ? ?Date/Time:  Friday November 15 2021 10:15:42 EDT ?Ventricular Rate:  95 ?PR Interval:    ?QRS Duration: 145 ?QT Interval:  346 ?QTC Calculation: 435 ?R Axis:   260 ?Text Interpretation: undetermined rhythm, poor data quality Right bundle branch block, present on prior ECG Inferior infarct, old Confirmed by Dorie Rank [phone]) on 11/15/2021 10:28:02 AM ? ? EKG Interpretation ? ?Date/Time:  Friday November 15 2021 12:13:21 EDT ?Ventricular Rate:  92 ?PR Interval:  195 ?QRS Duration: 148 ?QT Interval:  341 ?QTC Calculation: 422 ?R Axis:   -38 ?Text Interpretation: Sinus rhythm Right bundle branch block Confirmed by Dorie Rank [phone]) on 11/15/2021 12:21:23 PM ?  ? ?  ? ? ?Radiology ?CT Head Wo Contrast ? ?Result Date: 11/15/2021 ?CLINICAL DATA:  Headache, new or worsening (Age >= 50y) EXAM: CT HEAD WITHOUT CONTRAST TECHNIQUE: Contiguous axial images were obtained from the base of the skull through the vertex without intravenous contrast. RADIATION DOSE  REDUCTION: This exam was performed according to the departmental dose-optimization program which includes automated exposure control, adjustment of the mA and/or kV according to patient size and/or use of iterative reconstruction technique. COMPARISON:  2019 FINDINGS: Brain: There is no acute intracranial hemorrhage, mass effect, or edema. Gray-white differentiation is preserved. There is no extra-axial fluid collection. Prominence of the ventricles and sulci reflects  minor parenchymal volume loss. Patchy and confluent areas of low-density in the supratentorial white matter are nonspecific but may reflect moderate chronic microvascular ischemic changes. Vascular: There is atherosclerotic calcification at the skull base. Skull: Calvarium is unremarkable. Sinuses/Orbits: Patchy paranasal sinus mucosal thickening. No acute orbital abnormality. Other: None. IMPRESSION: No acute intracranial abnormality. Moderate chronic microvascular ischemic changes. Electronically Signed   By: Macy Mis M.D.   On: 11/15/2021 11:02  ? ?DG Chest Portable 1 View ? ?Result Date: 11/15/2021 ?CLINICAL DATA:  A 81 year old male presents for evaluation of high blood pressure, shakiness. EXAM: PORTABLE CHEST 1 VIEW COMPARISON:  December 06, 2020. FINDINGS: EKG leads project over the chest. Post median sternotomy for CABG. Stable appearance of cardiomediastinal contours. Lungs are clear. No sign of effusion on frontal radiograph. No pneumothorax. On limited assessment no acute skeletal process. IMPRESSION: No acute cardiopulmonary process. Electronically Signed   By: Zetta Bills M.D.   On: 11/15/2021 10:48   ? ?Procedures ?Procedures  ? ? ?Medications Ordered in ED ?Medications  ?ondansetron (ZOFRAN) injection 4 mg (4 mg Intravenous Given 11/15/21 1122)  ?acetaminophen (TYLENOL) tablet 650 mg (650 mg Oral Given 11/15/21 1244)  ? ? ?ED Course/ Medical Decision Making/ A&P ?Clinical Course as of 11/15/21 1436  ?Fri Nov 15, 2021  ?1113  CBC(!) ?Leukocytosis noted [JK]  ?1113 DG Chest Portable 1 View ?Chest x-ray images and radiology report reviewed.  No acute process [JK]  ?1131 CT Head Wo Contrast ?Head CT without acute findings [JK]  ?7017 Basic metabolic panel(!) ?H

## 2021-11-18 DIAGNOSIS — Z6827 Body mass index (BMI) 27.0-27.9, adult: Secondary | ICD-10-CM | POA: Diagnosis not present

## 2021-11-18 DIAGNOSIS — L03115 Cellulitis of right lower limb: Secondary | ICD-10-CM | POA: Diagnosis not present

## 2021-11-22 DIAGNOSIS — Z8551 Personal history of malignant neoplasm of bladder: Secondary | ICD-10-CM | POA: Diagnosis not present

## 2021-12-19 ENCOUNTER — Encounter: Payer: Self-pay | Admitting: Cardiology

## 2021-12-19 ENCOUNTER — Ambulatory Visit: Payer: Medicare PPO | Admitting: Cardiology

## 2021-12-19 VITALS — BP 120/56 | HR 60 | Ht 78.0 in | Wt 242.0 lb

## 2021-12-19 DIAGNOSIS — I10 Essential (primary) hypertension: Secondary | ICD-10-CM | POA: Diagnosis not present

## 2021-12-19 DIAGNOSIS — E782 Mixed hyperlipidemia: Secondary | ICD-10-CM | POA: Diagnosis not present

## 2021-12-19 DIAGNOSIS — I2510 Atherosclerotic heart disease of native coronary artery without angina pectoris: Secondary | ICD-10-CM | POA: Diagnosis not present

## 2021-12-19 MED ORDER — ISOSORBIDE MONONITRATE ER 30 MG PO TB24: 15.0000 mg | ORAL_TABLET | Freq: Every day | ORAL | 1 refills | Status: DC

## 2021-12-19 NOTE — Progress Notes (Signed)
? ? ? ?Clinical Summary ?Craig Taylor is a 81 y.o.male seen today for follow up of the following medical problems.  ?  ?  ?1. CAD ?- history of RCA stent in 2003 ?- admitted 11/2020 with unstable angina ?- 11/2020 cath: 60% LM with 99% branching into osital LCX, ostial LAD 45%, LCX mild disease, RCA mid 70% ?11/2020 echo: LVEF 55-60%, grade I dd, normal RV ? - 11/26/20 CABG x3 with LIMA-LAD, SVG-PDA, SVG-OM ?  ? ? 11/15/21 ER visit with chest pain, high bp, feeling feverish ?- WBC 16.8. Trop neg x 2. EKG SR,RBBB ?- some chronic chest wall pains at times ?- mild upper chest pain with activities, resolves with rest.  ?  ?2.Postop afib ?- developed after CABG ?- no recurrence, amio and eliquis stopped ?  ?  ?  ?3. HTN ?- compliant with meds ?  ?4. Hyperlipidemia ? ?- lipitor caused itching.  ?  ?11/2020 TC 105 TG 131 LDL 54 HDL 25 ?-reports recent labs with pcp ? ? ? ?SH: has RV goes out camping 3-4 times per year. Recent trip to [facility].  ? ? ? ? ?Past Medical History:  ?Diagnosis Date  ? Benign localized prostatic hyperplasia with lower urinary tract symptoms (LUTS)   ? Bladder cancer ([place])   ? urologist--- dr winter---  s/p  TURBT 09/ 2020  ? CKD (chronic kidney disease), stage III ([place])   ? neprologist--- dr Lowanda Foster  (12-15-2019  per pt was released to pcp at [facility] 02-06-2018 scanned in epic under media  ? Coronary artery disease cardiologist--- dr Bronson Ing  ? hx MI in 2003--- 08-02-2002  s/p cardiac cath with PCI and DES to [facility] and PDA;  cath 10-04-2002 patent stents w/ 30-40% AV groove Cx with ef 50% and mild inferior hypokinesis  ? COVID-19 12/16/2019  ? asymptomatic  ? Full dentures   ? Glaucoma, right eye   ? History of basal cell carcinoma (BCC) excision   ? History of gout   ? per pt one episode 01/ 2021   ? History of kidney stones   ? History of MI (myocardial infarction) 08/01/2002  ? History of ulcer of large intestine   ? per pt approx. 2002 told due to oral voltaren , colonoscopy/ egd with no  intervention,  but did have blood transfusions  ? Hyperlipidemia   ? Hypertension   ? followed by pcp   (nuclear stress test in epic 05-11-2018  low risk w/ no ischemia, nuclear ef 55%)  ? IDA (iron deficiency anemia)   ? Nephrolithiasis   ? Non-insulin dependent type 2 diabetes mellitus ([place])   ? followed by pcp  (12-15-2019   per pt checks blood sugar 3 times a week,  fasting sugar-- 120-140)  ? S/P drug eluting coronary stent placement 08/02/2002  ? to proximal RCA and PDA  ? Wears glasses   ? ? ? ?Allergies  ?Allergen Reactions  ? Lipitor [Atorvastatin] Other (See Comments)  ?  pruritis  ? Voltaren [Diclofenac Sodium] Other (See Comments)  ?  "caused intestine ulcers with bleeding"  ? Penicillins Rash  ?  Has patient had a PCN reaction causing immediate rash, facial/tongue/throat swelling, SOB or lightheadedness with hypotension: No ?Has patient had a PCN reaction causing severe rash involving mucus membranes or skin necrosis: No ?Has patient had a PCN reaction that required hospitalization: No ?Has patient had a PCN reaction occurring within the last 10 years: No ? ?Reaction from injection as child- extensive  rash per patient  ? ? ? ?Current Outpatient Medications  ?Medication Sig Dispense Refill  ? acetaminophen (TYLENOL) 500 MG tablet Take 500 mg by mouth every 6 (six) hours as needed for mild pain or headache.    ? aspirin EC 81 MG tablet Take 81 mg by mouth at bedtime.    ? enalapril (VASOTEC) 2.5 MG tablet Take 2.5 mg by mouth at bedtime.    ? metFORMIN (GLUCOPHAGE) 500 MG tablet Take 1,000 mg by mouth 2 (two) times daily with a meal.    ? metoprolol tartrate (LOPRESSOR) 50 MG tablet TAKE (1) TABLET BY MOUTH TWICE DAILY. (Patient taking differently: Take 50 mg by mouth 2 (two) times daily.) 180 tablet 3  ? Misc Natural Products (IMMUNE ESSENTIALS) CAPS Take 1 capsule by mouth at bedtime.    ? Omega 3-6-9 Fatty Acids (OMEGA 3-6-9 COMPLEX PO) Take 1 capsule by mouth daily.    ? simvastatin (ZOCOR) 20 MG  tablet Take 1 tablet (20 mg total) by mouth every evening. (Patient not taking: Reported on 11/15/2021) 30 tablet 2  ? simvastatin (ZOCOR) 40 MG tablet Take 40 mg by mouth at bedtime.    ? tamsulosin (FLOMAX) 0.4 MG CAPS capsule Take 0.4 mg by mouth in the morning and at bedtime.    ? ?No current facility-administered medications for this visit.  ? ? ? ?Past Surgical History:  ?Procedure Laterality Date  ? BIOPSY  07/13/2018  ? Procedure: BIOPSY;  Surgeon: Daneil Dolin, MD;  [facility];  Service: Endoscopy;;  (gastric)  ? CARDIAC CATHETERIZATION  08/02/2002   dr berry  ? [place] of RCA 95% stenosis, stented with a 2.5x52m CYPHER stent resulting in reduction of 95% stenosis to 0% residual; overlapping CYPHER stents -3x257mand 3x1856mdeployed at 15atm resulting in reduction of 70% stenosis to 0% residual.  ? CARDIAC CATHETERIZATION  10/04/2002    dr berry  ? patent stents w/ 30-40% AV groove LCx , mild inferior hypokinesis, ef 50%  ? COLONOSCOPY N/A 05/31/2014  ? Dr. RouGala Romneyormal  ? COLONOSCOPY N/A 07/13/2018  ? Procedure: COLONOSCOPY;  Surgeon: RouDaneil DolinD;  [facility];  Service: Endoscopy;  Laterality: N/A;  8:30am  ? CORONARY ARTERY BYPASS GRAFT N/A 11/26/2020  ? Procedure: CORONARY ARTERY BYPASS GRAFTING (CABG) TIMES 3 ON PUMP USING LEFT INTERNAL MAMMARY ARTERY AND ENDOSCOPICALLY HARVESTED RIGHT GREATER SAPHENOUS VEIN;  Surgeon: AtkWonda OldsD;  [facility];  Laterality: N/A;  ? ENDOVEIN HARVEST OF GREATER SAPHENOUS VEIN Right 11/26/2020  ? Procedure: ENDOVEIN HARVEST OF GREATER SAPHENOUS VEIN;  Surgeon: AtkWonda OldsD;  [facility];  Laterality: Right;  ? ESOPHAGOGASTRODUODENOSCOPY N/A 07/13/2018  ? Procedure: ESOPHAGOGASTRODUODENOSCOPY (EGD);  Surgeon: RouDaneil DolinD;  [facility];  Service: Endoscopy;  Laterality: N/A;  ? LEFT HEART CATH AND CORONARY ANGIOGRAPHY N/A 11/22/2020  ?  Procedure: LEFT HEART CATH AND CORONARY ANGIOGRAPHY;  Surgeon: HarLeonie ManD;  [facility] [place] [facility];  Service: Cardiovascular;  Laterality: N/A;  ? TEE WITHOUT CARDIOVERSION N/A 11/26/2020  ? Procedure: TRANSESOPHAGEAL ECHOCARDIOGRAM (TEE);  Surgeon: AtkWonda OldsD;  [facility];  Laterality: N/A;  ? TRANSURETHRAL RESECTION OF BLADDER TUMOR N/A 04/22/2019  ? Procedure: TRANSURETHRAL RESECTION OF BLADDER TUMOR (TURBT)/ CYSTOSCOPY BILATERAL RETROGRADE PYELOGRAMS,  GEMCITABINE INSTILLATION;  Surgeon: WinCeasar MonsD;  [facility];  Service: Urology;  Laterality: N/A;  ONLY NEEDS 45 MIN  ? TRANSURETHRAL RESECTION OF BLADDER TUMOR N/A 01/25/2020  ? Procedure: TRANSURETHRAL RESECTION OF BLADDER TUMOR (TURBT)/ CYSTOSCOPY;  Surgeon: Ceasar Mons, MD;  Location: [facility];  Service: Urology;  Laterality: N/A;  ONLY NEEDS 30 MIN  ? ? ? ?Allergies  ?Allergen Reactions  ? Lipitor [Atorvastatin] Other (See Comments)  ?  pruritis  ? Voltaren [Diclofenac Sodium] Other (See Comments)  ?  "caused intestine ulcers with bleeding"  ? Penicillins Rash  ?  Has patient had a PCN reaction causing immediate rash, facial/tongue/throat swelling, SOB or lightheadedness with hypotension: No ?Has patient had a PCN reaction causing severe rash involving mucus membranes or skin necrosis: No ?Has patient had a PCN reaction that required hospitalization: No ?Has patient had a PCN reaction occurring within the last 10 years: No ? ?Reaction from injection as child- extensive rash per patient  ? ? ? ? ?Family History  ?Problem Relation Age of Onset  ? Stroke Mother   ? Diabetes Mother   ? Colon cancer Neg Hx   ? ? ? ?Social History ?Mr. Keetch reports that he quit smoking about 38 years ago. His smoking use included cigarettes. He started smoking about 64 years ago. He has a 42.00 pack-year smoking history. He quit smokeless  tobacco use about 9 years ago.  His smokeless tobacco use included chew. ?Mr. Fillinger reports no history of alcohol use. ? ? ?Review of Systems ?CONSTITUTIONAL: No weight loss, fever, chills, weakness or fatigue.  ?HE

## 2021-12-19 NOTE — Patient Instructions (Signed)
Medication Instructions:  ?Your physician has recommended you make the following change in your medication:  ?Start Imdur 15 mg once a day ?Continue all other medications as prescribed ? ?Labwork: ?none ? ?Testing/Procedures: ?none ? ?Follow-Up: ?Your physician recommends that you schedule a follow-up appointment in: 6 months ? ?Any Other Special Instructions Will Be Listed Below (If Applicable). ? ?If you need a refill on your cardiac medications before your next appointment, please call your pharmacy. ? ?

## 2022-01-03 DIAGNOSIS — H26493 Other secondary cataract, bilateral: Secondary | ICD-10-CM | POA: Diagnosis not present

## 2022-01-10 ENCOUNTER — Encounter: Payer: Self-pay | Admitting: *Deleted

## 2022-02-20 DIAGNOSIS — C671 Malignant neoplasm of dome of bladder: Secondary | ICD-10-CM | POA: Diagnosis not present

## 2022-02-20 DIAGNOSIS — Z5111 Encounter for antineoplastic chemotherapy: Secondary | ICD-10-CM | POA: Diagnosis not present

## 2022-03-04 DIAGNOSIS — C671 Malignant neoplasm of dome of bladder: Secondary | ICD-10-CM | POA: Diagnosis not present

## 2022-03-04 DIAGNOSIS — Z5111 Encounter for antineoplastic chemotherapy: Secondary | ICD-10-CM | POA: Diagnosis not present

## 2022-03-12 DIAGNOSIS — Z5111 Encounter for antineoplastic chemotherapy: Secondary | ICD-10-CM | POA: Diagnosis not present

## 2022-03-12 DIAGNOSIS — C671 Malignant neoplasm of dome of bladder: Secondary | ICD-10-CM | POA: Diagnosis not present

## 2022-04-07 DIAGNOSIS — Z8551 Personal history of malignant neoplasm of bladder: Secondary | ICD-10-CM | POA: Diagnosis not present

## 2022-04-08 DIAGNOSIS — N401 Enlarged prostate with lower urinary tract symptoms: Secondary | ICD-10-CM | POA: Diagnosis not present

## 2022-04-08 DIAGNOSIS — I129 Hypertensive chronic kidney disease with stage 1 through stage 4 chronic kidney disease, or unspecified chronic kidney disease: Secondary | ICD-10-CM | POA: Diagnosis not present

## 2022-04-08 DIAGNOSIS — E1165 Type 2 diabetes mellitus with hyperglycemia: Secondary | ICD-10-CM | POA: Diagnosis not present

## 2022-04-08 DIAGNOSIS — Z125 Encounter for screening for malignant neoplasm of prostate: Secondary | ICD-10-CM | POA: Diagnosis not present

## 2022-04-08 DIAGNOSIS — N183 Chronic kidney disease, stage 3 unspecified: Secondary | ICD-10-CM | POA: Diagnosis not present

## 2022-04-08 DIAGNOSIS — Z1331 Encounter for screening for depression: Secondary | ICD-10-CM | POA: Diagnosis not present

## 2022-04-08 DIAGNOSIS — I2510 Atherosclerotic heart disease of native coronary artery without angina pectoris: Secondary | ICD-10-CM | POA: Diagnosis not present

## 2022-04-08 DIAGNOSIS — Z6828 Body mass index (BMI) 28.0-28.9, adult: Secondary | ICD-10-CM | POA: Diagnosis not present

## 2022-04-08 DIAGNOSIS — C672 Malignant neoplasm of lateral wall of bladder: Secondary | ICD-10-CM | POA: Diagnosis not present

## 2022-04-08 DIAGNOSIS — E119 Type 2 diabetes mellitus without complications: Secondary | ICD-10-CM | POA: Diagnosis not present

## 2022-04-08 DIAGNOSIS — Z0001 Encounter for general adult medical examination with abnormal findings: Secondary | ICD-10-CM | POA: Diagnosis not present

## 2022-05-06 DIAGNOSIS — H401112 Primary open-angle glaucoma, right eye, moderate stage: Secondary | ICD-10-CM | POA: Diagnosis not present

## 2022-05-06 DIAGNOSIS — H26493 Other secondary cataract, bilateral: Secondary | ICD-10-CM | POA: Diagnosis not present

## 2022-05-06 DIAGNOSIS — H43813 Vitreous degeneration, bilateral: Secondary | ICD-10-CM | POA: Diagnosis not present

## 2022-05-06 DIAGNOSIS — Z961 Presence of intraocular lens: Secondary | ICD-10-CM | POA: Diagnosis not present

## 2022-05-06 DIAGNOSIS — H40002 Preglaucoma, unspecified, left eye: Secondary | ICD-10-CM | POA: Diagnosis not present

## 2022-06-09 DIAGNOSIS — C672 Malignant neoplasm of lateral wall of bladder: Secondary | ICD-10-CM | POA: Diagnosis not present

## 2022-06-09 DIAGNOSIS — Z5111 Encounter for antineoplastic chemotherapy: Secondary | ICD-10-CM | POA: Diagnosis not present

## 2022-06-12 ENCOUNTER — Other Ambulatory Visit: Payer: Self-pay | Admitting: Cardiology

## 2022-06-16 DIAGNOSIS — C671 Malignant neoplasm of dome of bladder: Secondary | ICD-10-CM | POA: Diagnosis not present

## 2022-06-16 DIAGNOSIS — Z5111 Encounter for antineoplastic chemotherapy: Secondary | ICD-10-CM | POA: Diagnosis not present

## 2022-06-23 DIAGNOSIS — C671 Malignant neoplasm of dome of bladder: Secondary | ICD-10-CM | POA: Diagnosis not present

## 2022-06-23 DIAGNOSIS — Z5111 Encounter for antineoplastic chemotherapy: Secondary | ICD-10-CM | POA: Diagnosis not present

## 2022-07-08 ENCOUNTER — Ambulatory Visit: Payer: Medicare PPO | Attending: Cardiology | Admitting: Cardiology

## 2022-07-08 ENCOUNTER — Encounter: Payer: Self-pay | Admitting: Cardiology

## 2022-07-08 ENCOUNTER — Encounter: Payer: Self-pay | Admitting: *Deleted

## 2022-07-08 VITALS — BP 136/60 | HR 68 | Ht 78.0 in | Wt 243.4 lb

## 2022-07-08 DIAGNOSIS — I10 Essential (primary) hypertension: Secondary | ICD-10-CM

## 2022-07-08 DIAGNOSIS — E782 Mixed hyperlipidemia: Secondary | ICD-10-CM | POA: Diagnosis not present

## 2022-07-08 DIAGNOSIS — I25118 Atherosclerotic heart disease of native coronary artery with other forms of angina pectoris: Secondary | ICD-10-CM

## 2022-07-08 MED ORDER — ISOSORBIDE MONONITRATE ER 30 MG PO TB24: 30.0000 mg | ORAL_TABLET | Freq: Every day | ORAL | 3 refills | Status: DC

## 2022-07-08 NOTE — Patient Instructions (Signed)
Medication Instructions:  Increase Imdur to '30mg'$  daily  Continue all other medications.     Labwork: none  Testing/Procedures: none  Follow-Up: 6 months   Any Other Special Instructions Will Be Listed Below (If Applicable).   If you need a refill on your cardiac medications before your next appointment, please call your pharmacy.

## 2022-07-08 NOTE — Progress Notes (Signed)
Clinical Summary Craig Taylor is a 81 y.o.male seen today for follow up of the following medical problems.      1. CAD - history of RCA stent in 2003 - admitted 11/2020 with unstable angina - 11/2020 cath: 60% LM with 99% branching into osital LCX, ostial LAD 45%, LCX mild disease, RCA mid 70% 11/2020 echo: LVEF 55-60%, grade I dd, normal RV  - 11/26/20 CABG x3 with LIMA-LAD, SVG-PDA, SVG-OM      11/15/21 ER visit with chest pain, high bp, feeling feverish - WBC 16.8. Trop neg x 2. EKG SR,RBBB - some chronic chest wall pains at times - mild upper chest pain with activities, resolves with rest.   - last visit started imdur '15mg'$  daily due to mild exertional symptoms - symptoms have improved but not resolved. Mild symptoms at times with heavy exertion, for example cutting up tree and hauling a few days ago.  - compliant with meds   2.Postop afib - developed after CABG - no recurrence, amio and eliquis stopped       3. HTN - he is compliant with meds   4. Hyperlipidemia   - lipitor caused itching. Has been on simvastatin   11/2020 TC 105 TG 131 LDL 54 HDL 25 -recent labs with pcp per his report       SH: has RV goes out camping 3-4 times per year. Recent trip to [facility].        Past Medical History:  Diagnosis Date   Benign localized prostatic hyperplasia with lower urinary tract symptoms (LUTS)    Bladder cancer [facility])    urologist--- dr winter---  s/p  TURBT 09/ 2020   CKD (chronic kidney disease), stage III ([place])    neprologist--- dr Lowanda Foster  (12-15-2019  per pt was released to pcp at [facility] 02-06-2018 scanned in epic under media   Coronary artery disease cardiologist--- dr Bronson Ing   hx MI in 2003--- 08-02-2002  s/p cardiac cath with PCI and DES to [facility] and PDA;  cath 10-04-2002 patent stents w/ 30-40% AV groove Cx with ef 50% and mild inferior hypokinesis   COVID-19 12/16/2019   asymptomatic   Full dentures    Glaucoma, right eye    History of  basal cell carcinoma (BCC) excision    History of gout    per pt one episode 01/ 2021    History of kidney stones    History of MI (myocardial infarction) 08/01/2002   History of ulcer of large intestine    per pt approx. 2002 told due to oral voltaren , colonoscopy/ egd with no intervention,  but did have blood transfusions   Hyperlipidemia    Hypertension    followed by pcp   (nuclear stress test in epic 05-11-2018  low risk w/ no ischemia, nuclear ef 55%)   IDA (iron deficiency anemia)    Nephrolithiasis    Non-insulin dependent type 2 diabetes mellitus ([place])    followed by pcp  (12-15-2019   per pt checks blood sugar 3 times a week,  fasting sugar-- 120-140)   S/P drug eluting coronary stent placement 08/02/2002   to proximal RCA and PDA   Wears glasses      Allergies  Allergen Reactions   Lipitor [Atorvastatin] Other (See Comments)    pruritis   Voltaren [Diclofenac Sodium] Other (See Comments)    "caused intestine ulcers with bleeding"   Penicillins Rash    Has patient had a PCN  reaction causing immediate rash, facial/tongue/throat swelling, SOB or lightheadedness with hypotension: No Has patient had a PCN reaction causing severe rash involving mucus membranes or skin necrosis: No Has patient had a PCN reaction that required hospitalization: No Has patient had a PCN reaction occurring within the last 10 years: No  Reaction from injection as child- extensive rash per patient     Current Outpatient Medications  Medication Sig Dispense Refill   acetaminophen (TYLENOL) 500 MG tablet Take 500 mg by mouth every 6 (six) hours as needed for mild pain or headache.     aspirin EC 81 MG tablet Take 81 mg by mouth at bedtime.     enalapril (VASOTEC) 2.5 MG tablet Take 2.5 mg by mouth at bedtime.     isosorbide mononitrate (IMDUR) 30 MG 24 hr tablet TAKE (1/2) TABLET BY MOUTH DAILY. 45 tablet 1   metFORMIN (GLUCOPHAGE) 500 MG tablet Take 1,000 mg by mouth 2 (two) times daily with a  meal.     metoprolol tartrate (LOPRESSOR) 50 MG tablet TAKE (1) TABLET BY MOUTH TWICE DAILY. (Patient taking differently: Take 50 mg by mouth 2 (two) times daily.) 180 tablet 3   nitroGLYCERIN (NITROSTAT) 0.4 MG SL tablet Place 0.4 mg under the tongue every 5 (five) minutes x 3 doses as needed.     Omega 3-6-9 Fatty Acids (OMEGA 3-6-9 COMPLEX PO) Take 1 capsule by mouth daily.     simvastatin (ZOCOR) 40 MG tablet Take 40 mg by mouth at bedtime.     tamsulosin (FLOMAX) 0.4 MG CAPS capsule Take 0.4 mg by mouth in the morning and at bedtime.     No current facility-administered medications for this visit.     Past Surgical History:  Procedure Laterality Date   BIOPSY  07/13/2018   Procedure: BIOPSY;  Surgeon: Daneil Dolin, MD;  [facility];  Service: Endoscopy;;  (gastric)   CARDIAC CATHETERIZATION  08/02/2002   dr berry   Crux of RCA 95% stenosis, stented with a 2.5x75m CYPHER stent resulting in reduction of 95% stenosis to 0% residual; overlapping CYPHER stents -3x267mand 3x1824mdeployed at 15atm resulting in reduction of 70% stenosis to 0% residual.   CARDIAC CATHETERIZATION  10/04/2002    dr berry   patent stents w/ 30-40% AV groove LCx , mild inferior hypokinesis, ef 50%   COLONOSCOPY N/A 05/31/2014   Dr. RouGala Romneyormal   COLONOSCOPY N/A 07/13/2018   Procedure: COLONOSCOPY;  Surgeon: RouDaneil DolinD;  [facility];  Service: Endoscopy;  Laterality: N/A;  8:30am   CORONARY ARTERY BYPASS GRAFT N/A 11/26/2020   Procedure: CORONARY ARTERY BYPASS GRAFTING (CABG) TIMES 3 ON PUMP USING LEFT INTERNAL MAMMARY ARTERY AND ENDOSCOPICALLY HARVESTED RIGHT GREATER SAPHENOUS VEIN;  Surgeon: AtkWonda OldsD;  [facility];  Laterality: N/A;   ENDOVEIN HARVEST OF GREATER SAPHENOUS VEIN Right 11/26/2020   Procedure: ENDOVEIN HARVEST OF GREATER SAPHENOUS VEIN;  Surgeon: AtkWonda OldsD;  [facility];   Laterality: Right;   ESOPHAGOGASTRODUODENOSCOPY N/A 07/13/2018   Procedure: ESOPHAGOGASTRODUODENOSCOPY (EGD);  Surgeon: RouDaneil DolinD;  [facility];  Service: Endoscopy;  Laterality: N/A;   LEFT HEART CATH AND CORONARY ANGIOGRAPHY N/A 11/22/2020   Procedure: LEFT HEART CATH AND CORONARY ANGIOGRAPHY;  Surgeon: HarLeonie ManD;  [facility] [place] [facility];  Service: Cardiovascular;  Laterality: N/A;   TEE WITHOUT CARDIOVERSION N/A 11/26/2020  Procedure: TRANSESOPHAGEAL ECHOCARDIOGRAM (TEE);  Surgeon: Wonda Olds, MD;  Location: [place];  Service: Open Heart Surgery;  Laterality: N/A;   TRANSURETHRAL RESECTION OF BLADDER TUMOR N/A 04/22/2019   Procedure: TRANSURETHRAL RESECTION OF BLADDER TUMOR (TURBT)/ CYSTOSCOPY BILATERAL RETROGRADE PYELOGRAMS,  GEMCITABINE INSTILLATION;  Surgeon: Ceasar Mons, MD;  Location: [facility];  Service: Urology;  Laterality: N/A;  ONLY NEEDS 45 MIN   TRANSURETHRAL RESECTION OF BLADDER TUMOR N/A 01/25/2020   Procedure: TRANSURETHRAL RESECTION OF BLADDER TUMOR (TURBT)/ CYSTOSCOPY;  Surgeon: Ceasar Mons, MD;  Location: [facility];  Service: Urology;  Laterality: N/A;  ONLY NEEDS 30 MIN     Allergies  Allergen Reactions   Lipitor [Atorvastatin] Other (See Comments)    pruritis   Voltaren [Diclofenac Sodium] Other (See Comments)    "caused intestine ulcers with bleeding"   Penicillins Rash    Has patient had a PCN reaction causing immediate rash, facial/tongue/throat swelling, SOB or lightheadedness with hypotension: No Has patient had a PCN reaction causing severe rash involving mucus membranes or skin necrosis: No Has patient had a PCN reaction that required hospitalization: No Has patient had a PCN reaction occurring within the last 10 years: No  Reaction from injection as child- extensive rash per patient      Family History  Problem Relation Age of Onset   Stroke Mother     Diabetes Mother    Colon cancer Neg Hx      Social History Mr. Nickles reports that he quit smoking about 38 years ago. His smoking use included cigarettes. He started smoking about 65 years ago. He has a 42.00 pack-year smoking history. He quit smokeless tobacco use about 9 years ago.  His smokeless tobacco use included chew. Mr. Mckillop reports no history of alcohol use.   Review of Systems CONSTITUTIONAL: No weight loss, fever, chills, weakness or fatigue.  HEENT: Eyes: No visual loss, blurred vision, double vision or yellow sclerae.No hearing loss, sneezing, congestion, runny nose or sore throat.  SKIN: No rash or itching.  CARDIOVASCULAR: per hpi RESPIRATORY: No shortness of breath, cough or sputum.  GASTROINTESTINAL: No anorexia, nausea, vomiting or diarrhea. No abdominal pain or blood.  GENITOURINARY: No burning on urination, no polyuria NEUROLOGICAL: No headache, dizziness, syncope, paralysis, ataxia, numbness or tingling in the extremities. No change in bowel or bladder control.  MUSCULOSKELETAL: No muscle, back pain, joint pain or stiffness.  LYMPHATICS: No enlarged nodes. No history of splenectomy.  PSYCHIATRIC: No history of depression or anxiety.  ENDOCRINOLOGIC: No reports of sweating, cold or heat intolerance. No polyuria or polydipsia.  Marland Kitchen   Physical Examination Today's Vitals   07/08/22 1008  BP: 136/60  Pulse: 68  SpO2: 95%  Weight: 243 lb 6.4 oz (110.4 kg)  Height: '6\' 6"'$  (1.981 m)   Body mass index is 28.13 kg/m.  Gen: resting comfortably, no acute distress HEENT: no scleral icterus, pupils equal round and reactive, no palptable cervical adenopathy,  CV:RRR, no m/r/g no jvd Resp: Clear to auscultation bilaterally GI: abdomen is soft, non-tender, non-distended, normal bowel sounds, no hepatosplenomegaly MSK: extremities are warm, no edema.  Skin: warm, no rash Neuro:  no focal deficits Psych: appropriate affect   Diagnostic Studies  ??8Tss test  05/11/18:    Blood pressure demonstrated a hypertensive response to exercise.  There was no ST segment deviation noted during stress.  Defect 1: There is a medium defect of moderate severity present in the mid inferoseptal, mid inferior and  apical inferior location. This is consistent with myocardial scar as well as overlying soft tissue attenuation. There are no significant ischemic territories.  This is a low risk study.  Nuclear stress EF: 55%.     11/2020 cath  LV end diastolic pressure is normal.  There is no aortic valve stenosis.  Prox RCA to Mid RCA lesion is 10% stenosed.  Mid RCA lesion is 70% stenosed.  Dist RCA lesion is 60% stenosed.  Mid LM to Ost LAD lesion is 60% stenosed with 99% stenosed side Aleecia Tapia in Ost Cx.  Ost LAD to Mid LAD lesion is 45% stenosed.   SUMMARY  Severe multivessel CAD:  Distal left main 55-60% involving 99% ostial LCx and ostial high OM1/RI; LCx otherwise has mild proximal and mid disease terminating as major branching lateral a OM Jaicey Sweaney  40% proximal LAD with relatively minimal disease throughout the remainder the LAD and major 1st Diag  Tandem mid and distal RCA lesions of 70% and 60%  Normal EDP.  Normal EF by echo     POST CATH RECOMMENDATIONS 1. Return to nursing unit for ongoing post-cath care.   2. Restart IV heparin 8 hours post sheath removal. 3. Patient has surgical disease with the distal left main and ostial LCx.  Will consult CVTS in the morning. ? PCI options would be high risk given the sharp angle takeoff of the circumflex, calcified ostial lesion with a sidebranch (high OM/RI).  Would need to stent across the LCx into the LAD as well (DK crush versus DK culotte technique would be required)  Continue aggressive risk modification.     11/2020 echo IMPRESSIONS     1. Left ventricular ejection fraction, by estimation, is 55 to 60%. The  left ventricle has normal function. The left ventricle has no regional  wall  motion abnormalities. Left ventricular diastolic parameters are  consistent with Grade I diastolic  dysfunction (impaired relaxation). The average left ventricular global  longitudinal strain is 20.1 %. The global longitudinal strain is normal.   2. Right ventricular systolic function is normal. The right ventricular  size is normal. There is normal pulmonary artery systolic pressure. The  estimated right ventricular systolic pressure is 28.7 mmHg.   3. The mitral valve is normal in structure. No evidence of mitral valve  regurgitation. No evidence of mitral stenosis.   4. The aortic valve is tricuspid. Aortic valve regurgitation is not  visualized. No aortic stenosis is present.   5. Aortic dilatation noted. There is mild dilatation of the ascending  aorta, measuring 37 mm.   6. The inferior vena cava is normal in size with greater than 50%  respiratory variability, suggesting right atrial pressure of 3 mmHg.         Assessment and Plan  1. CAD with stable angina - mild chest pains with exertion improved with imdur 15 but not resolved, recent symptoms with high levels of activity. Increase imdur to '30mg'$  daily. Unless progression of symptoms would not plan for ischemic testing, CABG just last year.    2. HTN -essentially at goal, continue current meds   3. Hyperlipidemia - will request pcp labs, continue current meds      Arnoldo Lenis, M.D.

## 2022-07-09 DIAGNOSIS — L821 Other seborrheic keratosis: Secondary | ICD-10-CM | POA: Diagnosis not present

## 2022-07-09 DIAGNOSIS — Z85828 Personal history of other malignant neoplasm of skin: Secondary | ICD-10-CM | POA: Diagnosis not present

## 2022-07-09 DIAGNOSIS — C4441 Basal cell carcinoma of skin of scalp and neck: Secondary | ICD-10-CM | POA: Diagnosis not present

## 2022-07-09 DIAGNOSIS — L814 Other melanin hyperpigmentation: Secondary | ICD-10-CM | POA: Diagnosis not present

## 2022-07-09 DIAGNOSIS — C44629 Squamous cell carcinoma of skin of left upper limb, including shoulder: Secondary | ICD-10-CM | POA: Diagnosis not present

## 2022-07-09 DIAGNOSIS — L570 Actinic keratosis: Secondary | ICD-10-CM | POA: Diagnosis not present

## 2022-07-09 DIAGNOSIS — D1801 Hemangioma of skin and subcutaneous tissue: Secondary | ICD-10-CM | POA: Diagnosis not present

## 2022-07-09 DIAGNOSIS — D692 Other nonthrombocytopenic purpura: Secondary | ICD-10-CM | POA: Diagnosis not present

## 2022-08-05 ENCOUNTER — Other Ambulatory Visit: Payer: Self-pay | Admitting: Cardiology

## 2022-08-18 DIAGNOSIS — C671 Malignant neoplasm of dome of bladder: Secondary | ICD-10-CM | POA: Diagnosis not present

## 2022-08-18 DIAGNOSIS — Z8551 Personal history of malignant neoplasm of bladder: Secondary | ICD-10-CM | POA: Diagnosis not present

## 2022-08-25 DIAGNOSIS — M9901 Segmental and somatic dysfunction of cervical region: Secondary | ICD-10-CM | POA: Diagnosis not present

## 2022-08-25 DIAGNOSIS — M5137 Other intervertebral disc degeneration, lumbosacral region: Secondary | ICD-10-CM | POA: Diagnosis not present

## 2022-08-25 DIAGNOSIS — M9905 Segmental and somatic dysfunction of pelvic region: Secondary | ICD-10-CM | POA: Diagnosis not present

## 2022-08-25 DIAGNOSIS — M5033 Other cervical disc degeneration, cervicothoracic region: Secondary | ICD-10-CM | POA: Diagnosis not present

## 2022-08-26 DIAGNOSIS — M5033 Other cervical disc degeneration, cervicothoracic region: Secondary | ICD-10-CM | POA: Diagnosis not present

## 2022-08-26 DIAGNOSIS — M5137 Other intervertebral disc degeneration, lumbosacral region: Secondary | ICD-10-CM | POA: Diagnosis not present

## 2022-08-26 DIAGNOSIS — M9901 Segmental and somatic dysfunction of cervical region: Secondary | ICD-10-CM | POA: Diagnosis not present

## 2022-08-26 DIAGNOSIS — M9905 Segmental and somatic dysfunction of pelvic region: Secondary | ICD-10-CM | POA: Diagnosis not present

## 2022-08-28 DIAGNOSIS — M5033 Other cervical disc degeneration, cervicothoracic region: Secondary | ICD-10-CM | POA: Diagnosis not present

## 2022-08-28 DIAGNOSIS — M9901 Segmental and somatic dysfunction of cervical region: Secondary | ICD-10-CM | POA: Diagnosis not present

## 2022-08-28 DIAGNOSIS — M9905 Segmental and somatic dysfunction of pelvic region: Secondary | ICD-10-CM | POA: Diagnosis not present

## 2022-08-28 DIAGNOSIS — M5137 Other intervertebral disc degeneration, lumbosacral region: Secondary | ICD-10-CM | POA: Diagnosis not present

## 2022-09-01 DIAGNOSIS — M9901 Segmental and somatic dysfunction of cervical region: Secondary | ICD-10-CM | POA: Diagnosis not present

## 2022-09-01 DIAGNOSIS — M5137 Other intervertebral disc degeneration, lumbosacral region: Secondary | ICD-10-CM | POA: Diagnosis not present

## 2022-09-01 DIAGNOSIS — M9905 Segmental and somatic dysfunction of pelvic region: Secondary | ICD-10-CM | POA: Diagnosis not present

## 2022-09-01 DIAGNOSIS — M5033 Other cervical disc degeneration, cervicothoracic region: Secondary | ICD-10-CM | POA: Diagnosis not present

## 2022-09-02 DIAGNOSIS — M9905 Segmental and somatic dysfunction of pelvic region: Secondary | ICD-10-CM | POA: Diagnosis not present

## 2022-09-02 DIAGNOSIS — M5033 Other cervical disc degeneration, cervicothoracic region: Secondary | ICD-10-CM | POA: Diagnosis not present

## 2022-09-02 DIAGNOSIS — M5137 Other intervertebral disc degeneration, lumbosacral region: Secondary | ICD-10-CM | POA: Diagnosis not present

## 2022-09-02 DIAGNOSIS — M9901 Segmental and somatic dysfunction of cervical region: Secondary | ICD-10-CM | POA: Diagnosis not present

## 2022-09-04 DIAGNOSIS — M9901 Segmental and somatic dysfunction of cervical region: Secondary | ICD-10-CM | POA: Diagnosis not present

## 2022-09-04 DIAGNOSIS — M5137 Other intervertebral disc degeneration, lumbosacral region: Secondary | ICD-10-CM | POA: Diagnosis not present

## 2022-09-04 DIAGNOSIS — M5033 Other cervical disc degeneration, cervicothoracic region: Secondary | ICD-10-CM | POA: Diagnosis not present

## 2022-09-04 DIAGNOSIS — M9905 Segmental and somatic dysfunction of pelvic region: Secondary | ICD-10-CM | POA: Diagnosis not present

## 2022-09-08 DIAGNOSIS — M5137 Other intervertebral disc degeneration, lumbosacral region: Secondary | ICD-10-CM | POA: Diagnosis not present

## 2022-09-08 DIAGNOSIS — M9901 Segmental and somatic dysfunction of cervical region: Secondary | ICD-10-CM | POA: Diagnosis not present

## 2022-09-08 DIAGNOSIS — M5033 Other cervical disc degeneration, cervicothoracic region: Secondary | ICD-10-CM | POA: Diagnosis not present

## 2022-09-08 DIAGNOSIS — M9905 Segmental and somatic dysfunction of pelvic region: Secondary | ICD-10-CM | POA: Diagnosis not present

## 2022-09-09 DIAGNOSIS — M9905 Segmental and somatic dysfunction of pelvic region: Secondary | ICD-10-CM | POA: Diagnosis not present

## 2022-09-09 DIAGNOSIS — M9901 Segmental and somatic dysfunction of cervical region: Secondary | ICD-10-CM | POA: Diagnosis not present

## 2022-09-09 DIAGNOSIS — M5137 Other intervertebral disc degeneration, lumbosacral region: Secondary | ICD-10-CM | POA: Diagnosis not present

## 2022-09-09 DIAGNOSIS — M5033 Other cervical disc degeneration, cervicothoracic region: Secondary | ICD-10-CM | POA: Diagnosis not present

## 2022-09-11 DIAGNOSIS — M5033 Other cervical disc degeneration, cervicothoracic region: Secondary | ICD-10-CM | POA: Diagnosis not present

## 2022-09-11 DIAGNOSIS — M9901 Segmental and somatic dysfunction of cervical region: Secondary | ICD-10-CM | POA: Diagnosis not present

## 2022-09-11 DIAGNOSIS — M5137 Other intervertebral disc degeneration, lumbosacral region: Secondary | ICD-10-CM | POA: Diagnosis not present

## 2022-09-11 DIAGNOSIS — M9905 Segmental and somatic dysfunction of pelvic region: Secondary | ICD-10-CM | POA: Diagnosis not present

## 2022-09-15 DIAGNOSIS — M5137 Other intervertebral disc degeneration, lumbosacral region: Secondary | ICD-10-CM | POA: Diagnosis not present

## 2022-09-15 DIAGNOSIS — M9905 Segmental and somatic dysfunction of pelvic region: Secondary | ICD-10-CM | POA: Diagnosis not present

## 2022-09-15 DIAGNOSIS — M5033 Other cervical disc degeneration, cervicothoracic region: Secondary | ICD-10-CM | POA: Diagnosis not present

## 2022-09-15 DIAGNOSIS — M9901 Segmental and somatic dysfunction of cervical region: Secondary | ICD-10-CM | POA: Diagnosis not present

## 2022-09-16 DIAGNOSIS — M9905 Segmental and somatic dysfunction of pelvic region: Secondary | ICD-10-CM | POA: Diagnosis not present

## 2022-09-16 DIAGNOSIS — M5137 Other intervertebral disc degeneration, lumbosacral region: Secondary | ICD-10-CM | POA: Diagnosis not present

## 2022-09-16 DIAGNOSIS — M9901 Segmental and somatic dysfunction of cervical region: Secondary | ICD-10-CM | POA: Diagnosis not present

## 2022-09-16 DIAGNOSIS — M5033 Other cervical disc degeneration, cervicothoracic region: Secondary | ICD-10-CM | POA: Diagnosis not present

## 2022-09-18 DIAGNOSIS — M5137 Other intervertebral disc degeneration, lumbosacral region: Secondary | ICD-10-CM | POA: Diagnosis not present

## 2022-09-18 DIAGNOSIS — M9905 Segmental and somatic dysfunction of pelvic region: Secondary | ICD-10-CM | POA: Diagnosis not present

## 2022-09-18 DIAGNOSIS — M5033 Other cervical disc degeneration, cervicothoracic region: Secondary | ICD-10-CM | POA: Diagnosis not present

## 2022-09-18 DIAGNOSIS — M9901 Segmental and somatic dysfunction of cervical region: Secondary | ICD-10-CM | POA: Diagnosis not present

## 2022-09-22 DIAGNOSIS — M9901 Segmental and somatic dysfunction of cervical region: Secondary | ICD-10-CM | POA: Diagnosis not present

## 2022-09-22 DIAGNOSIS — M5033 Other cervical disc degeneration, cervicothoracic region: Secondary | ICD-10-CM | POA: Diagnosis not present

## 2022-09-22 DIAGNOSIS — M9905 Segmental and somatic dysfunction of pelvic region: Secondary | ICD-10-CM | POA: Diagnosis not present

## 2022-09-22 DIAGNOSIS — M5137 Other intervertebral disc degeneration, lumbosacral region: Secondary | ICD-10-CM | POA: Diagnosis not present

## 2022-09-29 DIAGNOSIS — Z5111 Encounter for antineoplastic chemotherapy: Secondary | ICD-10-CM | POA: Diagnosis not present

## 2022-09-29 DIAGNOSIS — C671 Malignant neoplasm of dome of bladder: Secondary | ICD-10-CM | POA: Diagnosis not present

## 2022-10-06 DIAGNOSIS — Z5111 Encounter for antineoplastic chemotherapy: Secondary | ICD-10-CM | POA: Diagnosis not present

## 2022-10-06 DIAGNOSIS — C671 Malignant neoplasm of dome of bladder: Secondary | ICD-10-CM | POA: Diagnosis not present

## 2022-10-13 DIAGNOSIS — C671 Malignant neoplasm of dome of bladder: Secondary | ICD-10-CM | POA: Diagnosis not present

## 2022-10-13 DIAGNOSIS — Z5111 Encounter for antineoplastic chemotherapy: Secondary | ICD-10-CM | POA: Diagnosis not present

## 2022-11-05 DIAGNOSIS — H33002 Unspecified retinal detachment with retinal break, left eye: Secondary | ICD-10-CM | POA: Diagnosis not present

## 2022-11-06 DIAGNOSIS — H33002 Unspecified retinal detachment with retinal break, left eye: Secondary | ICD-10-CM | POA: Diagnosis not present

## 2022-11-10 DIAGNOSIS — Z85828 Personal history of other malignant neoplasm of skin: Secondary | ICD-10-CM | POA: Diagnosis not present

## 2022-11-10 DIAGNOSIS — H33022 Retinal detachment with multiple breaks, left eye: Secondary | ICD-10-CM | POA: Diagnosis not present

## 2022-11-10 DIAGNOSIS — I252 Old myocardial infarction: Secondary | ICD-10-CM | POA: Diagnosis not present

## 2022-11-10 DIAGNOSIS — Z87891 Personal history of nicotine dependence: Secondary | ICD-10-CM | POA: Diagnosis not present

## 2022-11-10 DIAGNOSIS — I4891 Unspecified atrial fibrillation: Secondary | ICD-10-CM | POA: Diagnosis not present

## 2022-11-10 DIAGNOSIS — I2510 Atherosclerotic heart disease of native coronary artery without angina pectoris: Secondary | ICD-10-CM | POA: Diagnosis not present

## 2022-11-10 DIAGNOSIS — Z9841 Cataract extraction status, right eye: Secondary | ICD-10-CM | POA: Diagnosis not present

## 2022-11-10 DIAGNOSIS — N183 Chronic kidney disease, stage 3 unspecified: Secondary | ICD-10-CM | POA: Diagnosis not present

## 2022-11-10 DIAGNOSIS — H3322 Serous retinal detachment, left eye: Secondary | ICD-10-CM | POA: Diagnosis not present

## 2022-11-10 DIAGNOSIS — E1122 Type 2 diabetes mellitus with diabetic chronic kidney disease: Secondary | ICD-10-CM | POA: Diagnosis not present

## 2022-11-20 DIAGNOSIS — H40052 Ocular hypertension, left eye: Secondary | ICD-10-CM | POA: Diagnosis not present

## 2022-11-20 DIAGNOSIS — H401113 Primary open-angle glaucoma, right eye, severe stage: Secondary | ICD-10-CM | POA: Diagnosis not present

## 2022-11-20 DIAGNOSIS — H401121 Primary open-angle glaucoma, left eye, mild stage: Secondary | ICD-10-CM | POA: Diagnosis not present

## 2022-11-25 DIAGNOSIS — H33002 Unspecified retinal detachment with retinal break, left eye: Secondary | ICD-10-CM | POA: Diagnosis not present

## 2022-12-01 DIAGNOSIS — H33012 Retinal detachment with single break, left eye: Secondary | ICD-10-CM | POA: Diagnosis not present

## 2022-12-01 DIAGNOSIS — Z886 Allergy status to analgesic agent status: Secondary | ICD-10-CM | POA: Diagnosis not present

## 2022-12-01 DIAGNOSIS — Z955 Presence of coronary angioplasty implant and graft: Secondary | ICD-10-CM | POA: Diagnosis not present

## 2022-12-01 DIAGNOSIS — N183 Chronic kidney disease, stage 3 unspecified: Secondary | ICD-10-CM | POA: Diagnosis not present

## 2022-12-01 DIAGNOSIS — H33022 Retinal detachment with multiple breaks, left eye: Secondary | ICD-10-CM | POA: Diagnosis not present

## 2022-12-01 DIAGNOSIS — Z880 Allergy status to penicillin: Secondary | ICD-10-CM | POA: Diagnosis not present

## 2022-12-01 DIAGNOSIS — E1122 Type 2 diabetes mellitus with diabetic chronic kidney disease: Secondary | ICD-10-CM | POA: Diagnosis not present

## 2022-12-01 DIAGNOSIS — Z87891 Personal history of nicotine dependence: Secondary | ICD-10-CM | POA: Diagnosis not present

## 2022-12-01 DIAGNOSIS — I2510 Atherosclerotic heart disease of native coronary artery without angina pectoris: Secondary | ICD-10-CM | POA: Diagnosis not present

## 2022-12-01 DIAGNOSIS — Z888 Allergy status to other drugs, medicaments and biological substances status: Secondary | ICD-10-CM | POA: Diagnosis not present

## 2022-12-01 DIAGNOSIS — H33002 Unspecified retinal detachment with retinal break, left eye: Secondary | ICD-10-CM | POA: Diagnosis not present

## 2022-12-15 DIAGNOSIS — C671 Malignant neoplasm of dome of bladder: Secondary | ICD-10-CM | POA: Diagnosis not present

## 2022-12-30 DIAGNOSIS — H33002 Unspecified retinal detachment with retinal break, left eye: Secondary | ICD-10-CM | POA: Diagnosis not present

## 2023-01-14 ENCOUNTER — Ambulatory Visit: Payer: Medicare PPO | Attending: Cardiology | Admitting: Cardiology

## 2023-01-14 ENCOUNTER — Encounter: Payer: Self-pay | Admitting: Cardiology

## 2023-01-14 VITALS — BP 120/62 | HR 88 | Ht 78.0 in | Wt 247.4 lb

## 2023-01-14 DIAGNOSIS — I25118 Atherosclerotic heart disease of native coronary artery with other forms of angina pectoris: Secondary | ICD-10-CM | POA: Diagnosis not present

## 2023-01-14 DIAGNOSIS — E782 Mixed hyperlipidemia: Secondary | ICD-10-CM

## 2023-01-14 DIAGNOSIS — I10 Essential (primary) hypertension: Secondary | ICD-10-CM

## 2023-01-14 NOTE — Patient Instructions (Addendum)

## 2023-01-14 NOTE — Progress Notes (Signed)
Clinical Summary Mr. Koyanagi is a 82 y.o.male seen today for follow up of the following medical problems.    1. CAD - history of RCA stent in 2003 - admitted 11/2020 with unstable angina - 11/2020 cath: 60% LM with 99% branching into osital LCX, ostial LAD 45%, LCX mild disease, RCA mid 70% 11/2020 echo: LVEF 55-60%, grade I dd, normal RV  - 11/26/20 CABG x3 with LIMA-LAD, SVG-PDA, SVG-OM      11/15/21 ER visit with chest pain, high bp, feeling feverish - WBC 16.8. Trop neg x 2. EKG SR,RBBB - some chronic chest wall pains at times - mild upper chest pain with activities, resolves with rest.    - last visit started imdur 15mg  daily due to mild exertional symptoms - symptoms have improved but not resolved. Mild symptoms at times with heavy exertion, for example cutting up tree and hauling a few days ago.  - compliant with meds  -no recent chest pains, improved imdur 30mg   - compliant with meds    2.Postop afib - developed after CABG - no recurrence, amio and eliquis stopped       3. HTN - compliant with meds   4. Hyperlipidemia   - lipitor caused itching. Has been on simvastatin   11/2020 TC 105 TG 131 LDL 54 HDL 25 -recent labs with pcp per his report       SH: has RV goes out camping 3-4 times per year. Recent trip to [facility].      Past Medical History:  Diagnosis Date   Benign localized prostatic hyperplasia with lower urinary tract symptoms (LUTS)    [facility]    urologist--- dr winter---  s/p  TURBT 09/ 2020   CKD (chronic kidney disease), stage III (HCC)    neprologist--- dr Kristian Covey  (12-15-2019  per pt was released to pcp at [facility] 02-06-2018 scanned in epic under media   Coronary artery disease cardiologist--- dr Purvis Sheffield   hx MI in 2003--- 08-02-2002  s/p cardiac cath with PCI and DES to [facility] and PDA;  cath 10-04-2002 patent stents w/ 30-40% AV groove Cx with ef 50% and mild inferior hypokinesis   COVID-19 12/16/2019    asymptomatic   Full dentures    Glaucoma, right eye    History of basal cell carcinoma (BCC) excision    History of gout    per pt one episode 01/ 2021    History of kidney stones    History of MI (myocardial infarction) 08/01/2002   History of ulcer of large intestine    per pt approx. 2002 told due to oral voltaren , colonoscopy/ egd with no intervention,  but did have blood transfusions   Hyperlipidemia    Hypertension    followed by pcp   (nuclear stress test in epic 05-11-2018  low risk w/ no ischemia, nuclear ef 55%)   IDA (iron deficiency anemia)    Nephrolithiasis    Non-insulin dependent type 2 diabetes mellitus (HCC)    followed by pcp  (12-15-2019   per pt checks blood sugar 3 times a week,  fasting sugar-- 120-140)   S/P drug eluting coronary stent placement 08/02/2002   to proximal RCA and PDA   Wears glasses      Allergies  Allergen Reactions   Lipitor [Atorvastatin] Other (See Comments)    pruritis   Voltaren [Diclofenac Sodium] Other (See Comments)    "caused intestine ulcers with bleeding"   Penicillins  Rash    Has patient had a PCN reaction causing immediate rash, facial/tongue/throat swelling, SOB or lightheadedness with hypotension: No Has patient had a PCN reaction causing severe rash involving mucus membranes or skin necrosis: No Has patient had a PCN reaction that required hospitalization: No Has patient had a PCN reaction occurring within the last 10 years: No  Reaction from injection as child- extensive rash per patient     Current Outpatient Medications  Medication Sig Dispense Refill   acetaminophen (TYLENOL) 500 MG tablet Take 500 mg by mouth every 6 (six) hours as needed for mild pain or headache.     aspirin EC 81 MG tablet Take 81 mg by mouth at bedtime.     enalapril (VASOTEC) 2.5 MG tablet Take 2.5 mg by mouth at bedtime.     isosorbide mononitrate (IMDUR) 30 MG 24 hr tablet Take 1 tablet (30 mg total) by mouth daily. 90 tablet 3    metFORMIN (GLUCOPHAGE) 500 MG tablet Take 1,000 mg by mouth 2 (two) times daily with a meal.     metoprolol tartrate (LOPRESSOR) 50 MG tablet TAKE (1) TABLET BY MOUTH TWICE DAILY. 180 tablet 1   nitroGLYCERIN (NITROSTAT) 0.4 MG SL tablet Place 0.4 mg under the tongue every 5 (five) minutes x 3 doses as needed.     Omega 3-6-9 Fatty Acids (OMEGA 3-6-9 COMPLEX PO) Take 1 capsule by mouth daily.     simvastatin (ZOCOR) 40 MG tablet Take 40 mg by mouth at bedtime.     tamsulosin (FLOMAX) 0.4 MG CAPS capsule Take 0.4 mg by mouth in the morning and at bedtime.     No current facility-administered medications for this visit.     Past Surgical History:  Procedure Laterality Date   BIOPSY  07/13/2018   Procedure: BIOPSY;  Surgeon: Corbin Ade, MD;  Location: [facility];  Service: Endoscopy;;  (gastric)   CARDIAC CATHETERIZATION  08/02/2002   dr berry   Crux of RCA 95% stenosis, stented with a 2.5x60mm CYPHER stent resulting in reduction of 95% stenosis to 0% residual; overlapping CYPHER stents -3x75mm and 3x69mm- deployed at 15atm resulting in reduction of 70% stenosis to 0% residual.   CARDIAC CATHETERIZATION  10/04/2002    dr berry   patent stents w/ 30-40% AV groove LCx , mild inferior hypokinesis, ef 50%   COLONOSCOPY N/A 05/31/2014   Dr. Jena Gauss: Normal   COLONOSCOPY N/A 07/13/2018   Procedure: COLONOSCOPY;  Surgeon: Corbin Ade, MD;  Location: [facility];  Service: Endoscopy;  Laterality: N/A;  8:30am   CORONARY ARTERY BYPASS GRAFT N/A 11/26/2020   Procedure: CORONARY ARTERY BYPASS GRAFTING (CABG) TIMES 3 ON PUMP USING LEFT INTERNAL MAMMARY ARTERY AND ENDOSCOPICALLY HARVESTED RIGHT GREATER SAPHENOUS VEIN;  Surgeon: Linden Dolin, MD;  Location: [facility] OR;  Service: Open Heart Surgery;  Laterality: N/A;   ENDOVEIN HARVEST OF GREATER SAPHENOUS VEIN Right 11/26/2020   Procedure: ENDOVEIN HARVEST OF GREATER SAPHENOUS VEIN;  Surgeon: Linden Dolin, MD;  Location: [facility] OR;  Service: Open  Heart Surgery;  Laterality: Right;   ESOPHAGOGASTRODUODENOSCOPY N/A 07/13/2018   Procedure: ESOPHAGOGASTRODUODENOSCOPY (EGD);  Surgeon: Corbin Ade, MD;  Location: [facility];  Service: Endoscopy;  Laterality: N/A;   LEFT HEART CATH AND CORONARY ANGIOGRAPHY N/A 11/22/2020   Procedure: LEFT HEART CATH AND CORONARY ANGIOGRAPHY;  Surgeon: Marykay Lex, MD;  Location: [facility] INVASIVE CV LAB;  Service: Cardiovascular;  Laterality: N/A;   TEE WITHOUT CARDIOVERSION N/A 11/26/2020  Procedure: TRANSESOPHAGEAL ECHOCARDIOGRAM (TEE);  Surgeon: Linden Dolin, MD;  Location: [facility] OR;  Service: Open Heart Surgery;  Laterality: N/A;   TRANSURETHRAL RESECTION OF BLADDER TUMOR N/A 04/22/2019   Procedure: TRANSURETHRAL RESECTION OF BLADDER TUMOR (TURBT)/ CYSTOSCOPY BILATERAL RETROGRADE PYELOGRAMS,  GEMCITABINE INSTILLATION;  Surgeon: Rene Paci, MD;  Location: [facility];  Service: Urology;  Laterality: N/A;  ONLY NEEDS 45 MIN   TRANSURETHRAL RESECTION OF BLADDER TUMOR N/A 01/25/2020   Procedure: TRANSURETHRAL RESECTION OF BLADDER TUMOR (TURBT)/ CYSTOSCOPY;  Surgeon: Rene Paci, MD;  Location: [facility];  Service: Urology;  Laterality: N/A;  ONLY NEEDS 30 MIN     Allergies  Allergen Reactions   Lipitor [Atorvastatin] Other (See Comments)    pruritis   Voltaren [Diclofenac Sodium] Other (See Comments)    "caused intestine ulcers with bleeding"   Penicillins Rash    Has patient had a PCN reaction causing immediate rash, facial/tongue/throat swelling, SOB or lightheadedness with hypotension: No Has patient had a PCN reaction causing severe rash involving mucus membranes or skin necrosis: No Has patient had a PCN reaction that required hospitalization: No Has patient had a PCN reaction occurring within the last 10 years: No  Reaction from injection as child- extensive rash per patient      Family History  Problem Relation Age of Onset    Stroke Mother    Diabetes Mother    Colon cancer Neg Hx      Social History Mr. Bachus reports that he quit smoking about 39 years ago. His smoking use included cigarettes. He started smoking about 65 years ago. He has a 42.00 pack-year smoking history. He quit smokeless tobacco use about 10 years ago.  His smokeless tobacco use included chew. Mr. Nagy reports no history of alcohol use.   Review of Systems CONSTITUTIONAL: No weight loss, fever, chills, weakness or fatigue.  HEENT: Eyes: No visual loss, blurred vision, double vision or yellow sclerae.No hearing loss, sneezing, congestion, runny nose or sore throat.  SKIN: No rash or itching.  CARDIOVASCULAR: per hpi RESPIRATORY: No shortness of breath, cough or sputum.  GASTROINTESTINAL: No anorexia, nausea, vomiting or diarrhea. No abdominal pain or blood.  GENITOURINARY: No burning on urination, no polyuria NEUROLOGICAL: No headache, dizziness, syncope, paralysis, ataxia, numbness or tingling in the extremities. No change in bowel or bladder control.  MUSCULOSKELETAL: No muscle, back pain, joint pain or stiffness.  LYMPHATICS: No enlarged nodes. No history of splenectomy.  PSYCHIATRIC: No history of depression or anxiety.  ENDOCRINOLOGIC: No reports of sweating, cold or heat intolerance. No polyuria or polydipsia.  Marland Kitchen   Physical Examination Today's Vitals   01/14/23 1436  BP: 120/62  Pulse: 88  SpO2: 94%  Weight: 247 lb 6.4 oz (112.2 kg)  Height: 6\' 6"  (1.981 m)   Body mass index is 28.59 kg/m.  Gen: resting comfortably, no acute distress HEENT: no scleral icterus, pupils equal round and reactive, no palptable cervical adenopathy,  CV: RRR, no mrg, no jvd Resp: Clear to auscultation bilaterally GI: abdomen is soft, non-tender, non-distended, normal bowel sounds, no hepatosplenomegaly MSK: extremities are warm, no edema.  Skin: warm, no rash Neuro:  no focal deficits Psych: appropriate affect   Diagnostic  Studies 05/11/18:              Blood pressure demonstrated a hypertensive response to exercise.            There was no ST segment deviation noted during stress.  Defect 1: There is a medium defect of moderate severity present in the mid inferoseptal, mid inferior and apical inferior location. This is consistent with myocardial scar as well as overlying soft tissue attenuation. There are no significant ischemic territories.            This is a low risk study.            Nuclear stress EF: 55%.     11/2020 cath            LV end diastolic pressure is normal.            There is no aortic valve stenosis.            Prox RCA to Mid RCA lesion is 10% stenosed.            Mid RCA lesion is 70% stenosed.            Dist RCA lesion is 60% stenosed.            Mid LM to Ost LAD lesion is 60% stenosed with 99% stenosed side Sahian Kerney in Ost Cx.            Ost LAD to Mid LAD lesion is 45% stenosed.   SUMMARY            Severe multivessel CAD:            Distal left main 55-60% involving 99% ostial LCx and ostial high OM1/RI; LCx otherwise has mild proximal and mid disease terminating as major branching lateral a OM Shawndell Varas            40% proximal LAD with relatively minimal disease throughout the remainder the LAD and major 1st Diag            Tandem mid and distal RCA lesions of 70% and 60%            Normal EDP.  Normal EF by echo     POST CATH RECOMMENDATIONS 1.         Return to nursing unit for ongoing post-cath care.   2.         Restart IV heparin 8 hours post sheath removal. 3.         Patient has surgical disease with the distal left main and ostial LCx.  Will consult CVTS in the morning. ?          PCI options would be high risk given the sharp angle takeoff of the circumflex, calcified ostial lesion with a sidebranch (high OM/RI).  Would need to stent across the LCx into the LAD as well (DK crush versus DK culotte technique would be required)             Continue aggressive risk modification.     11/2020 echo IMPRESSIONS       1. Left ventricular ejection fraction, by estimation, is 55 to 60%. The  left ventricle has normal function. The left ventricle has no regional  wall motion abnormalities. Left ventricular diastolic parameters are  consistent with Grade I diastolic  dysfunction (impaired relaxation). The average left ventricular global  longitudinal strain is 20.1 %. The global longitudinal strain is normal.   2. Right ventricular systolic function is normal. The right ventricular  size is normal. There is normal pulmonary artery systolic pressure. The  estimated right ventricular systolic pressure is 26.4 mmHg.   3. The mitral valve is normal in structure. No evidence of mitral valve  regurgitation.  No evidence of mitral stenosis.   4. The aortic valve is tricuspid. Aortic valve regurgitation is not  visualized. No aortic stenosis is present.   5. Aortic dilatation noted. There is mild dilatation of the ascending  aorta, measuring 37 mm.   6. The inferior vena cava is normal in size with greater than 50%  respiratory variability, suggesting right atrial pressure of 3 mmHg.               Assessment and Plan   1. CAD with stable angina - no recent symptoms, continue current meds   2. HTN -at goal, continue current meds   3. Hyperlipidemia - requset pcp labs, continue current meds    Antoine Poche, M.D

## 2023-02-10 DIAGNOSIS — H33002 Unspecified retinal detachment with retinal break, left eye: Secondary | ICD-10-CM | POA: Diagnosis not present

## 2023-02-16 ENCOUNTER — Other Ambulatory Visit: Payer: Self-pay | Admitting: Cardiology

## 2023-03-26 DIAGNOSIS — E1159 Type 2 diabetes mellitus with other circulatory complications: Secondary | ICD-10-CM | POA: Diagnosis not present

## 2023-03-26 DIAGNOSIS — Z0001 Encounter for general adult medical examination with abnormal findings: Secondary | ICD-10-CM | POA: Diagnosis not present

## 2023-03-26 DIAGNOSIS — Z6828 Body mass index (BMI) 28.0-28.9, adult: Secondary | ICD-10-CM | POA: Diagnosis not present

## 2023-03-26 DIAGNOSIS — E1122 Type 2 diabetes mellitus with diabetic chronic kidney disease: Secondary | ICD-10-CM | POA: Diagnosis not present

## 2023-03-26 DIAGNOSIS — N183 Chronic kidney disease, stage 3 unspecified: Secondary | ICD-10-CM | POA: Diagnosis not present

## 2023-03-26 DIAGNOSIS — I10 Essential (primary) hypertension: Secondary | ICD-10-CM | POA: Diagnosis not present

## 2023-04-06 DIAGNOSIS — E875 Hyperkalemia: Secondary | ICD-10-CM | POA: Diagnosis not present

## 2023-04-10 DIAGNOSIS — I10 Essential (primary) hypertension: Secondary | ICD-10-CM | POA: Diagnosis not present

## 2023-04-10 DIAGNOSIS — Z1331 Encounter for screening for depression: Secondary | ICD-10-CM | POA: Diagnosis not present

## 2023-04-10 DIAGNOSIS — Z0001 Encounter for general adult medical examination with abnormal findings: Secondary | ICD-10-CM | POA: Diagnosis not present

## 2023-04-10 DIAGNOSIS — Z6828 Body mass index (BMI) 28.0-28.9, adult: Secondary | ICD-10-CM | POA: Diagnosis not present

## 2023-04-10 DIAGNOSIS — E6609 Other obesity due to excess calories: Secondary | ICD-10-CM | POA: Diagnosis not present

## 2023-04-17 DIAGNOSIS — C671 Malignant neoplasm of dome of bladder: Secondary | ICD-10-CM | POA: Diagnosis not present

## 2023-04-17 DIAGNOSIS — Z5111 Encounter for antineoplastic chemotherapy: Secondary | ICD-10-CM | POA: Diagnosis not present

## 2023-04-24 DIAGNOSIS — Z5111 Encounter for antineoplastic chemotherapy: Secondary | ICD-10-CM | POA: Diagnosis not present

## 2023-04-24 DIAGNOSIS — C672 Malignant neoplasm of lateral wall of bladder: Secondary | ICD-10-CM | POA: Diagnosis not present

## 2023-05-01 DIAGNOSIS — C672 Malignant neoplasm of lateral wall of bladder: Secondary | ICD-10-CM | POA: Diagnosis not present

## 2023-05-01 DIAGNOSIS — Z5111 Encounter for antineoplastic chemotherapy: Secondary | ICD-10-CM | POA: Diagnosis not present

## 2023-05-05 DIAGNOSIS — H33002 Unspecified retinal detachment with retinal break, left eye: Secondary | ICD-10-CM | POA: Diagnosis not present

## 2023-05-05 DIAGNOSIS — H401112 Primary open-angle glaucoma, right eye, moderate stage: Secondary | ICD-10-CM | POA: Diagnosis not present

## 2023-06-16 DIAGNOSIS — H33002 Unspecified retinal detachment with retinal break, left eye: Secondary | ICD-10-CM | POA: Diagnosis not present

## 2023-06-25 DIAGNOSIS — C672 Malignant neoplasm of lateral wall of bladder: Secondary | ICD-10-CM | POA: Diagnosis not present

## 2023-06-25 DIAGNOSIS — N401 Enlarged prostate with lower urinary tract symptoms: Secondary | ICD-10-CM | POA: Diagnosis not present

## 2023-06-25 DIAGNOSIS — R350 Frequency of micturition: Secondary | ICD-10-CM | POA: Diagnosis not present

## 2023-07-13 DIAGNOSIS — D1801 Hemangioma of skin and subcutaneous tissue: Secondary | ICD-10-CM | POA: Diagnosis not present

## 2023-07-13 DIAGNOSIS — D485 Neoplasm of uncertain behavior of skin: Secondary | ICD-10-CM | POA: Diagnosis not present

## 2023-07-13 DIAGNOSIS — L570 Actinic keratosis: Secondary | ICD-10-CM | POA: Diagnosis not present

## 2023-07-13 DIAGNOSIS — L308 Other specified dermatitis: Secondary | ICD-10-CM | POA: Diagnosis not present

## 2023-07-13 DIAGNOSIS — Z85828 Personal history of other malignant neoplasm of skin: Secondary | ICD-10-CM | POA: Diagnosis not present

## 2023-07-13 DIAGNOSIS — L821 Other seborrheic keratosis: Secondary | ICD-10-CM | POA: Diagnosis not present

## 2023-07-21 ENCOUNTER — Ambulatory Visit: Payer: Medicare PPO | Attending: Cardiology | Admitting: Cardiology

## 2023-07-21 ENCOUNTER — Encounter: Payer: Self-pay | Admitting: Cardiology

## 2023-07-21 VITALS — BP 122/64 | HR 70 | Ht 78.0 in | Wt 246.6 lb

## 2023-07-21 DIAGNOSIS — E782 Mixed hyperlipidemia: Secondary | ICD-10-CM

## 2023-07-21 DIAGNOSIS — I25118 Atherosclerotic heart disease of native coronary artery with other forms of angina pectoris: Secondary | ICD-10-CM

## 2023-07-21 DIAGNOSIS — I10 Essential (primary) hypertension: Secondary | ICD-10-CM

## 2023-07-21 NOTE — Patient Instructions (Signed)
Medication Instructions:  Continue all current medications.   Labwork: none  Testing/Procedures: none  Follow-Up: 6 months   Any Other Special Instructions Will Be Listed Below (If Applicable).   If you need a refill on your cardiac medications before your next appointment, please call your pharmacy.  

## 2023-07-21 NOTE — Progress Notes (Signed)
Clinical Summary Craig Taylor is a 82 y.o.male seen today for follow up of the following medical problems.      1. CAD - history of RCA stent in 2003 - admitted 11/2020 with unstable angina - 11/2020 cath: 60% LM with 99% branching into osital LCX, ostial LAD 45%, LCX mild disease, RCA mid 70% 11/2020 echo: LVEF 55-60%, grade I dd, normal RV  - 11/26/20 CABG x3 with LIMA-LAD, SVG-PDA, SVG-OM     - no chest pains. No specific SOB/DOE. Does moderate to heavy yardworkd without symptoms. - compliant with meds     2.Postop afib - developed after CABG - no recurrence, amio and eliquis stopped       3. HTN - he is compliant with meds   4. Hyperlipidemia   - lipitor caused itching. Has been on simvastatin   11/2020 TC 105 TG 131 LDL 54 HDL 25 -recent labs with pcp per his report       SH: has RV goes out camping 3-4 times per year. Recent trip to [facility].    Past Medical History:  Diagnosis Date   Benign localized prostatic hyperplasia with lower urinary tract symptoms (LUTS)    Bladder cancer [facility])    urologist--- dr winter---  s/p  TURBT 09/ 2020   CKD (chronic kidney disease), stage III (HCC)    neprologist--- dr Kristian Covey  (12-15-2019  per pt was released to pcp at [facility] 02-06-2018 scanned in epic under media   Coronary artery disease cardiologist--- dr Purvis Sheffield   hx MI in 2003--- 08-02-2002  s/p cardiac cath with PCI and DES to [facility] and PDA;  cath 10-04-2002 patent stents w/ 30-40% AV groove Cx with ef 50% and mild inferior hypokinesis   COVID-19 12/16/2019   asymptomatic   Full dentures    Glaucoma, right eye    History of basal cell carcinoma (BCC) excision    History of gout    per pt one episode 01/ 2021    History of kidney stones    History of MI (myocardial infarction) 08/01/2002   History of ulcer of large intestine    per pt approx. 2002 told due to oral voltaren , colonoscopy/ egd with no intervention,  but did have blood transfusions    Hyperlipidemia    Hypertension    followed by pcp   (nuclear stress test in epic 05-11-2018  low risk w/ no ischemia, nuclear ef 55%)   IDA (iron deficiency anemia)    Nephrolithiasis    Non-insulin dependent type 2 diabetes mellitus (HCC)    followed by pcp  (12-15-2019   per pt checks blood sugar 3 times a week,  fasting sugar-- 120-140)   S/P drug eluting coronary stent placement 08/02/2002   to proximal RCA and PDA   Wears glasses      Allergies  Allergen Reactions   Lipitor [Atorvastatin] Other (See Comments)    pruritis   Voltaren [Diclofenac Sodium] Other (See Comments)    "caused intestine ulcers with bleeding"   Penicillins Rash    Has patient had a PCN reaction causing immediate rash, facial/tongue/throat swelling, SOB or lightheadedness with hypotension: No Has patient had a PCN reaction causing severe rash involving mucus membranes or skin necrosis: No Has patient had a PCN reaction that required hospitalization: No Has patient had a PCN reaction occurring within the last 10 years: No  Reaction from injection as child- extensive rash per patient     Current  Outpatient Medications  Medication Sig Dispense Refill   acetaminophen (TYLENOL) 500 MG tablet Take 500 mg by mouth every 6 (six) hours as needed for mild pain or headache.     aspirin EC 81 MG tablet Take 81 mg by mouth at bedtime.     enalapril (VASOTEC) 2.5 MG tablet Take 2.5 mg by mouth at bedtime.     isosorbide mononitrate (IMDUR) 30 MG 24 hr tablet Take 1 tablet (30 mg total) by mouth daily. 90 tablet 3   metFORMIN (GLUCOPHAGE) 500 MG tablet Take 1,000 mg by mouth 2 (two) times daily with a meal.     metoprolol tartrate (LOPRESSOR) 50 MG tablet TAKE (1) TABLET BY MOUTH TWICE DAILY. 180 tablet 3   nitroGLYCERIN (NITROSTAT) 0.4 MG SL tablet Place 0.4 mg under the tongue every 5 (five) minutes x 3 doses as needed.     Omega 3-6-9 Fatty Acids (OMEGA 3-6-9 COMPLEX PO) Take 1 capsule by mouth daily.      simvastatin (ZOCOR) 40 MG tablet Take 40 mg by mouth at bedtime.     tamsulosin (FLOMAX) 0.4 MG CAPS capsule Take 0.4 mg by mouth in the morning and at bedtime.     No current facility-administered medications for this visit.     Past Surgical History:  Procedure Laterality Date   BIOPSY  07/13/2018   Procedure: BIOPSY;  Surgeon: Corbin Ade, MD;  Location: [facility];  Service: Endoscopy;;  (gastric)   CARDIAC CATHETERIZATION  08/02/2002   dr berry   Crux of RCA 95% stenosis, stented with a 2.5x25mm CYPHER stent resulting in reduction of 95% stenosis to 0% residual; overlapping CYPHER stents -3x70mm and 3x22mm- deployed at 15atm resulting in reduction of 70% stenosis to 0% residual.   CARDIAC CATHETERIZATION  10/04/2002    dr berry   patent stents w/ 30-40% AV groove LCx , mild inferior hypokinesis, ef 50%   COLONOSCOPY N/A 05/31/2014   Dr. Jena Gauss: Normal   COLONOSCOPY N/A 07/13/2018   Procedure: COLONOSCOPY;  Surgeon: Corbin Ade, MD;  Location: [facility];  Service: Endoscopy;  Laterality: N/A;  8:30am   CORONARY ARTERY BYPASS GRAFT N/A 11/26/2020   Procedure: CORONARY ARTERY BYPASS GRAFTING (CABG) TIMES 3 ON PUMP USING LEFT INTERNAL MAMMARY ARTERY AND ENDOSCOPICALLY HARVESTED RIGHT GREATER SAPHENOUS VEIN;  Surgeon: Linden Dolin, MD;  Location: [facility] OR;  Service: Open Heart Surgery;  Laterality: N/A;   ENDOVEIN HARVEST OF GREATER SAPHENOUS VEIN Right 11/26/2020   Procedure: ENDOVEIN HARVEST OF GREATER SAPHENOUS VEIN;  Surgeon: Linden Dolin, MD;  Location: [facility] OR;  Service: Open Heart Surgery;  Laterality: Right;   ESOPHAGOGASTRODUODENOSCOPY N/A 07/13/2018   Procedure: ESOPHAGOGASTRODUODENOSCOPY (EGD);  Surgeon: Corbin Ade, MD;  Location: [facility];  Service: Endoscopy;  Laterality: N/A;   LEFT HEART CATH AND CORONARY ANGIOGRAPHY N/A 11/22/2020   Procedure: LEFT HEART CATH AND CORONARY ANGIOGRAPHY;  Surgeon: Marykay Lex, MD;  Location: [facility] INVASIVE CV LAB;   Service: Cardiovascular;  Laterality: N/A;   TEE WITHOUT CARDIOVERSION N/A 11/26/2020   Procedure: TRANSESOPHAGEAL ECHOCARDIOGRAM (TEE);  Surgeon: Linden Dolin, MD;  Location: [facility] OR;  Service: Open Heart Surgery;  Laterality: N/A;   TRANSURETHRAL RESECTION OF BLADDER TUMOR N/A 04/22/2019   Procedure: TRANSURETHRAL RESECTION OF BLADDER TUMOR (TURBT)/ CYSTOSCOPY BILATERAL RETROGRADE PYELOGRAMS,  GEMCITABINE INSTILLATION;  Surgeon: Rene Paci, MD;  Location: [facility];  Service: Urology;  Laterality: N/A;  ONLY NEEDS 45 MIN   TRANSURETHRAL RESECTION  OF BLADDER TUMOR N/A 01/25/2020   Procedure: TRANSURETHRAL RESECTION OF BLADDER TUMOR (TURBT)/ CYSTOSCOPY;  Surgeon: Rene Paci, MD;  Location: [facility];  Service: Urology;  Laterality: N/A;  ONLY NEEDS 30 MIN     Allergies  Allergen Reactions   Lipitor [Atorvastatin] Other (See Comments)    pruritis   Voltaren [Diclofenac Sodium] Other (See Comments)    "caused intestine ulcers with bleeding"   Penicillins Rash    Has patient had a PCN reaction causing immediate rash, facial/tongue/throat swelling, SOB or lightheadedness with hypotension: No Has patient had a PCN reaction causing severe rash involving mucus membranes or skin necrosis: No Has patient had a PCN reaction that required hospitalization: No Has patient had a PCN reaction occurring within the last 10 years: No  Reaction from injection as child- extensive rash per patient      Family History  Problem Relation Age of Onset   Stroke Mother    Diabetes Mother    Colon cancer Neg Hx      Social History Craig Taylor reports that he quit smoking about 39 years ago. His smoking use included cigarettes. He started smoking about 66 years ago. He has a 52.2 pack-year smoking history. He quit smokeless tobacco use about 10 years ago.  His smokeless tobacco use included chew. Craig Taylor reports no history of alcohol  use.   Review of Systems CONSTITUTIONAL: No weight loss, fever, chills, weakness or fatigue.  HEENT: Eyes: No visual loss, blurred vision, double vision or yellow sclerae.No hearing loss, sneezing, congestion, runny nose or sore throat.  SKIN: No rash or itching.  CARDIOVASCULAR: per hpi RESPIRATORY: No shortness of breath, cough or sputum.  GASTROINTESTINAL: No anorexia, nausea, vomiting or diarrhea. No abdominal pain or blood.  GENITOURINARY: No burning on urination, no polyuria NEUROLOGICAL: No headache, dizziness, syncope, paralysis, ataxia, numbness or tingling in the extremities. No change in bowel or bladder control.  MUSCULOSKELETAL: No muscle, back pain, joint pain or stiffness.  LYMPHATICS: No enlarged nodes. No history of splenectomy.  PSYCHIATRIC: No history of depression or anxiety.  ENDOCRINOLOGIC: No reports of sweating, cold or heat intolerance. No polyuria or polydipsia.  Marland Kitchen   Physical Examination Today's Vitals   07/21/23 1334  BP: 122/64  Pulse: 70  SpO2: 95%  Weight: 246 lb 9.6 oz (111.9 kg)  Height: 6\' 6"  (1.981 m)   Body mass index is 28.5 kg/m.  Gen: resting comfortably, no acute distress HEENT: no scleral icterus, pupils equal round and reactive, no palptable cervical adenopathy,  CV: RRR, no mrg, no jvd Resp: Clear to auscultation bilaterally GI: abdomen is soft, non-tender, non-distended, normal bowel sounds, no hepatosplenomegaly MSK: extremities are warm, no edema.  Skin: warm, no rash Neuro:  no focal deficits Psych: appropriate affect   Diagnostic Studies  05/11/18:              Blood pressure demonstrated a hypertensive response to exercise.            There was no ST segment deviation noted during stress.            Defect 1: There is a medium defect of moderate severity present in the mid inferoseptal, mid inferior and apical inferior location. This is consistent with myocardial scar as well as overlying soft tissue attenuation. There  are no significant ischemic territories.            This is a low risk study.  Nuclear stress EF: 55%.     11/2020 cath            LV end diastolic pressure is normal.            There is no aortic valve stenosis.            Prox RCA to Mid RCA lesion is 10% stenosed.            Mid RCA lesion is 70% stenosed.            Dist RCA lesion is 60% stenosed.            Mid LM to Ost LAD lesion is 60% stenosed with 99% stenosed side Yeilyn Gent in Ost Cx.            Ost LAD to Mid LAD lesion is 45% stenosed.   SUMMARY            Severe multivessel CAD:            Distal left main 55-60% involving 99% ostial LCx and ostial high OM1/RI; LCx otherwise has mild proximal and mid disease terminating as major branching lateral a OM Quanesha Klimaszewski            40% proximal LAD with relatively minimal disease throughout the remainder the LAD and major 1st Diag            Tandem mid and distal RCA lesions of 70% and 60%            Normal EDP.  Normal EF by echo     POST CATH RECOMMENDATIONS 1.         Return to nursing unit for ongoing post-cath care.   2.         Restart IV heparin 8 hours post sheath removal. 3.         Patient has surgical disease with the distal left main and ostial LCx.  Will consult CVTS in the morning. ?          PCI options would be high risk given the sharp angle takeoff of the circumflex, calcified ostial lesion with a sidebranch (high OM/RI).  Would need to stent across the LCx into the LAD as well (DK crush versus DK culotte technique would be required)            Continue aggressive risk modification.     11/2020 echo IMPRESSIONS       1. Left ventricular ejection fraction, by estimation, is 55 to 60%. The  left ventricle has normal function. The left ventricle has no regional  wall motion abnormalities. Left ventricular diastolic parameters are  consistent with Grade I diastolic  dysfunction (impaired relaxation). The average left ventricular global   longitudinal strain is 20.1 %. The global longitudinal strain is normal.   2. Right ventricular systolic function is normal. The right ventricular  size is normal. There is normal pulmonary artery systolic pressure. The  estimated right ventricular systolic pressure is 26.4 mmHg.   3. The mitral valve is normal in structure. No evidence of mitral valve  regurgitation. No evidence of mitral stenosis.   4. The aortic valve is tricuspid. Aortic valve regurgitation is not  visualized. No aortic stenosis is present.   5. Aortic dilatation noted. There is mild dilatation of the ascending  aorta, measuring 37 mm.   6. The inferior vena cava is normal in size with greater than 50%  respiratory variability, suggesting right atrial pressure of 3 mmHg.  Assessment and Plan   1. CAD with stable angina -denies any recent symptoms, continue current meds   2. HTN -bp is at goal, continue current meds   3. Hyperlipidemia - lipids are well controlled, continue current meds  F/u 6 months     Antoine Poche, M.D.

## 2023-07-29 DIAGNOSIS — H5021 Vertical strabismus, right eye: Secondary | ICD-10-CM | POA: Diagnosis not present

## 2023-07-29 DIAGNOSIS — E1159 Type 2 diabetes mellitus with other circulatory complications: Secondary | ICD-10-CM | POA: Diagnosis not present

## 2023-08-14 ENCOUNTER — Other Ambulatory Visit: Payer: Self-pay | Admitting: Cardiology

## 2023-10-13 DIAGNOSIS — H33002 Unspecified retinal detachment with retinal break, left eye: Secondary | ICD-10-CM | POA: Diagnosis not present

## 2023-11-28 IMAGING — CT CT HEAD W/O CM
4 of 6 series · 17 of 47 positions shown, 18 images · non-contrast
Comparison: [id number]

CLINICAL DATA: Headache, new or worsening (Age >= 50y)



[Series 2: head w o · axial · 0.47mm/px · z∈[-7,+98]mm · 4 of 35 slices shown, 5 images]
[im 7/35  brain]
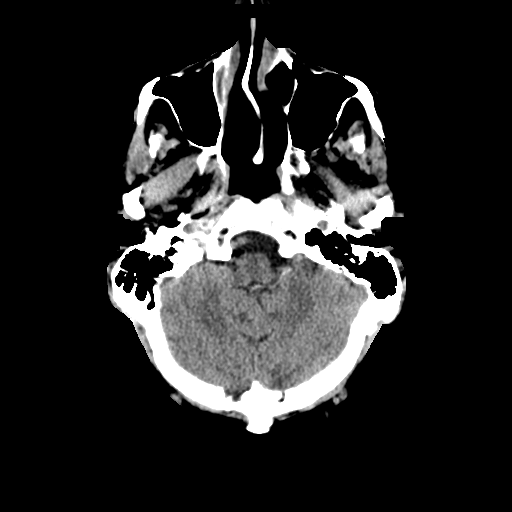
[im 7/35  bone]
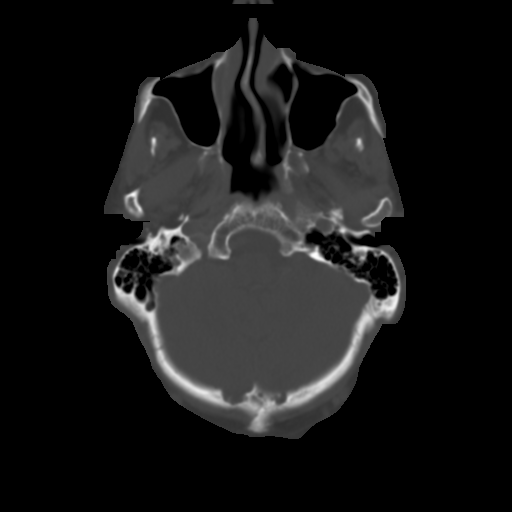
[im 14/35  brain]
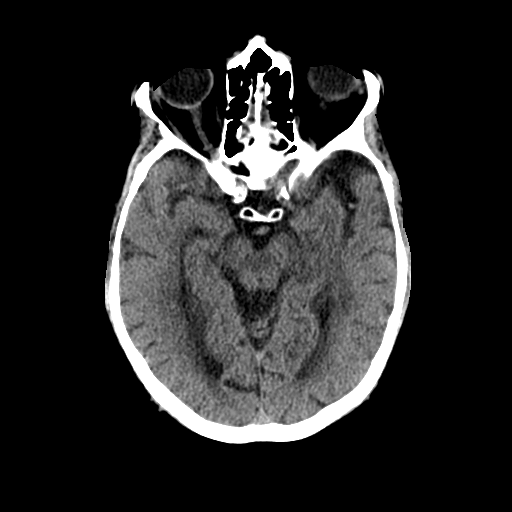
[im 21/35  brain]
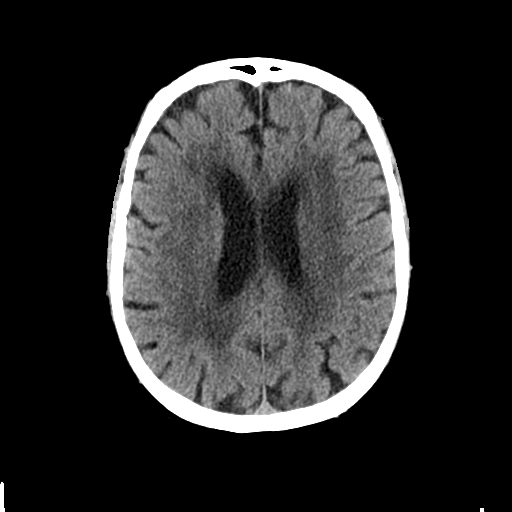
[im 28/35  brain]
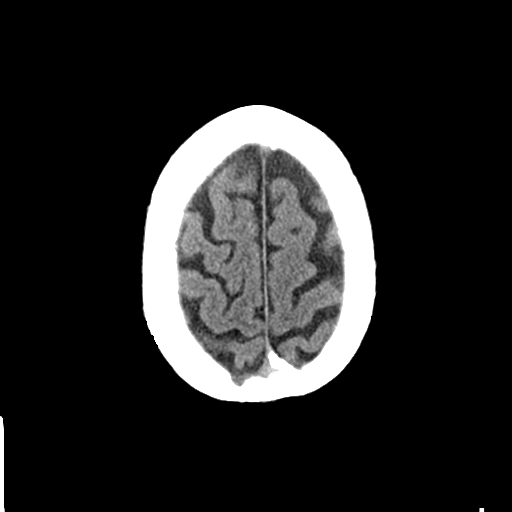

[Series 3: head bone · axial · 0.47mm/px · z∈[-27,+101]mm · 7 of 88 slices shown]
[im 6/88  bone]
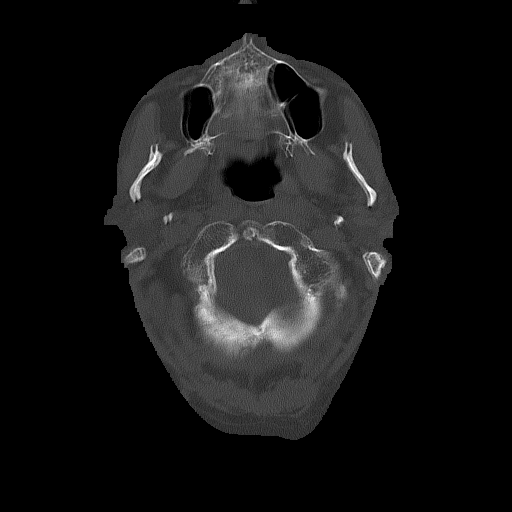
[im 18/88  bone]
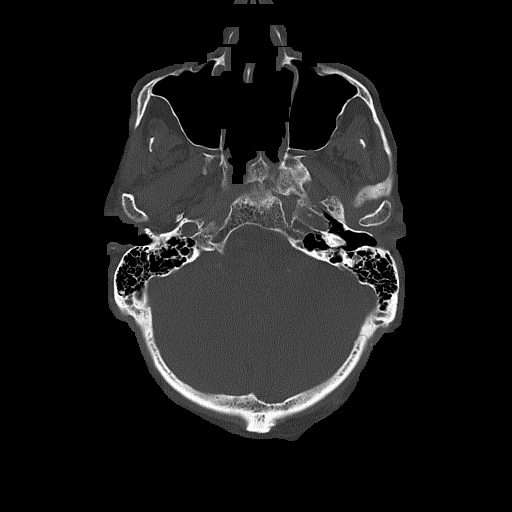
[im 30/88  bone]
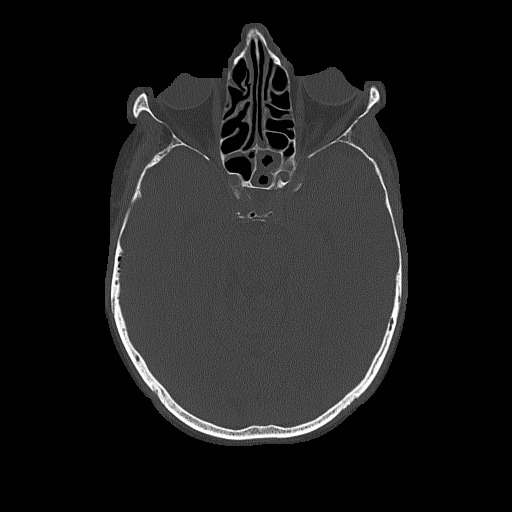
[im 41/88  bone]
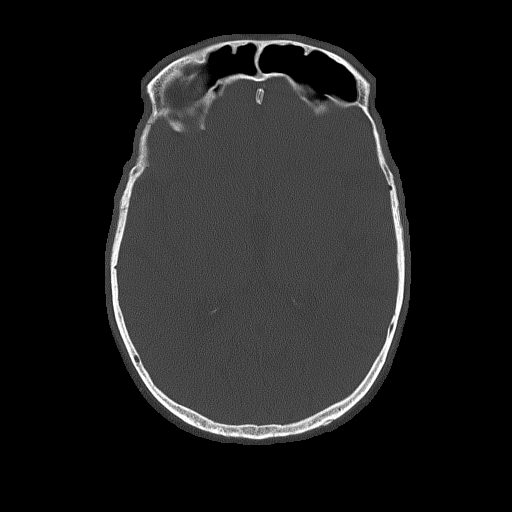
[im 47/88  bone]
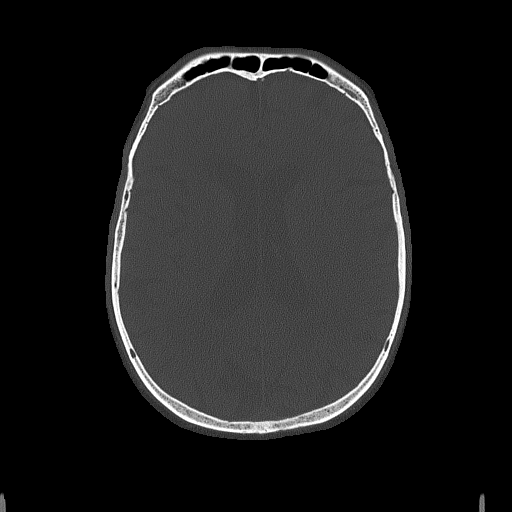
[im 59/88  bone]
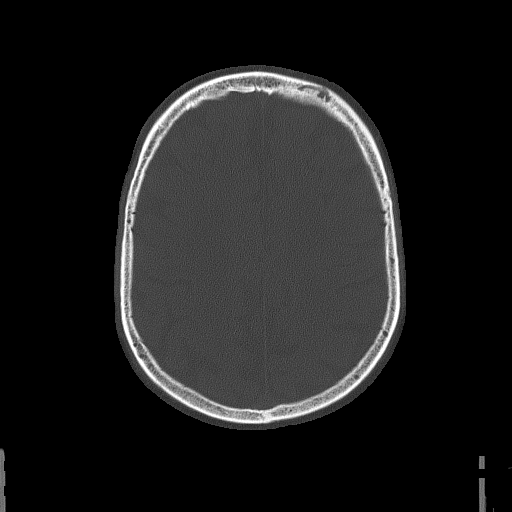
[im 70/88  bone]
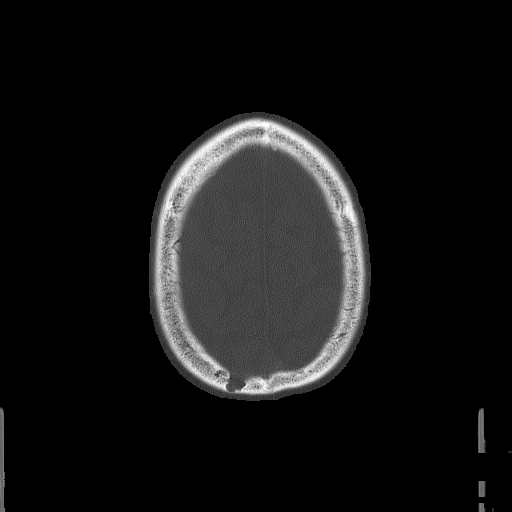

[Series 4: coronal soft · coronal · 0.36mm/px · 3 of 74 slices shown]
[im 25/74  brain]
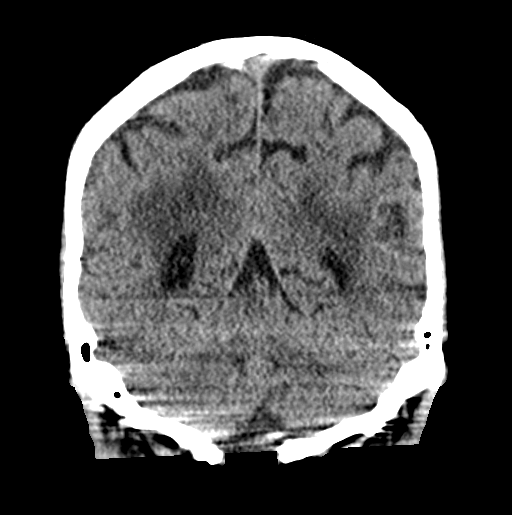
[im 33/74  brain]
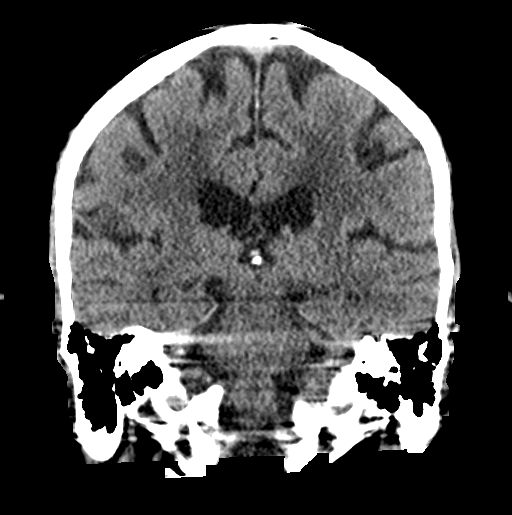
[im 41/74  brain]
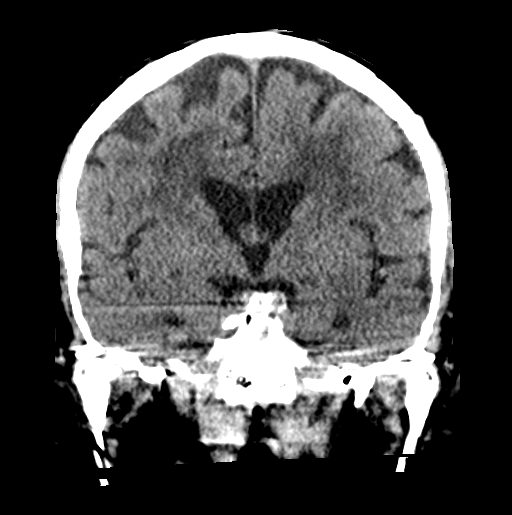

[Series 5: sagittal soft · sagittal · 0.36mm/px · 3 of 60 slices shown]
[im 20/60  brain]
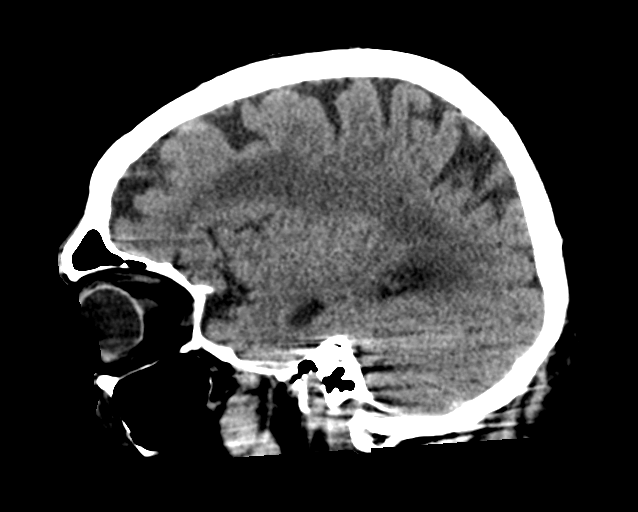
[im 30/60  brain]
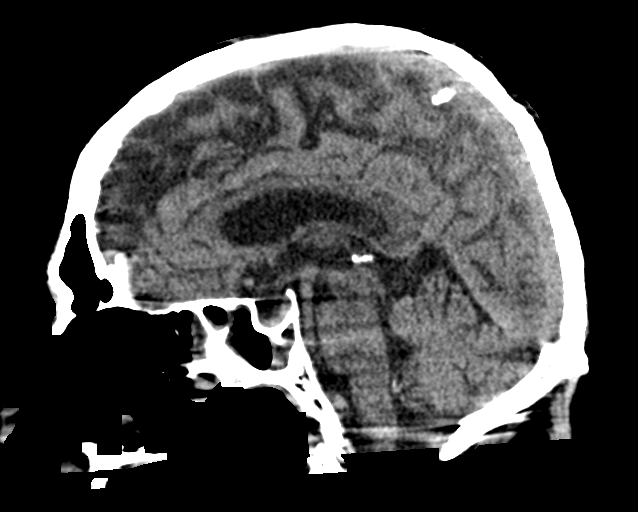
[im 40/60  brain]
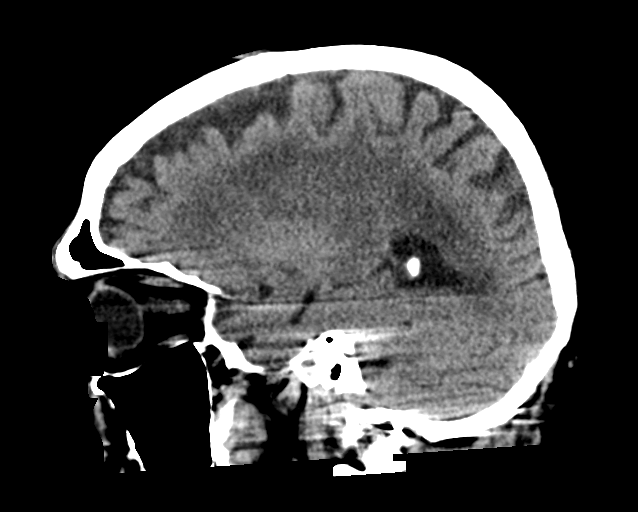

[17 of 47 positions shown; findings below may reference images not displayed]

FINDINGS: Brain: There is no acute intracranial hemorrhage, mass effect, or
edema. Gray-white differentiation is preserved. There is no
extra-axial fluid collection. Prominence of the ventricles and sulci
reflects minor parenchymal volume loss. Patchy and confluent areas
of low-density in the supratentorial white matter are nonspecific
but may reflect moderate chronic microvascular ischemic changes.

Vascular: There is atherosclerotic calcification at the skull base.

Skull: Calvarium is unremarkable.

Sinuses/Orbits: Patchy paranasal sinus mucosal thickening. No acute
orbital abnormality.

Other: None.
IMPRESSION: No acute intracranial abnormality.

Moderate chronic microvascular ischemic changes.

## 2023-12-21 DIAGNOSIS — N401 Enlarged prostate with lower urinary tract symptoms: Secondary | ICD-10-CM | POA: Diagnosis not present

## 2023-12-21 DIAGNOSIS — Z8551 Personal history of malignant neoplasm of bladder: Secondary | ICD-10-CM | POA: Diagnosis not present

## 2023-12-21 DIAGNOSIS — R350 Frequency of micturition: Secondary | ICD-10-CM | POA: Diagnosis not present

## 2024-01-01 DIAGNOSIS — I2510 Atherosclerotic heart disease of native coronary artery without angina pectoris: Secondary | ICD-10-CM | POA: Diagnosis not present

## 2024-01-01 DIAGNOSIS — I10 Essential (primary) hypertension: Secondary | ICD-10-CM | POA: Diagnosis not present

## 2024-01-01 DIAGNOSIS — N183 Chronic kidney disease, stage 3 unspecified: Secondary | ICD-10-CM | POA: Diagnosis not present

## 2024-01-01 DIAGNOSIS — Z6828 Body mass index (BMI) 28.0-28.9, adult: Secondary | ICD-10-CM | POA: Diagnosis not present

## 2024-01-01 DIAGNOSIS — E1159 Type 2 diabetes mellitus with other circulatory complications: Secondary | ICD-10-CM | POA: Diagnosis not present

## 2024-01-01 DIAGNOSIS — E6609 Other obesity due to excess calories: Secondary | ICD-10-CM | POA: Diagnosis not present

## 2024-01-01 DIAGNOSIS — I129 Hypertensive chronic kidney disease with stage 1 through stage 4 chronic kidney disease, or unspecified chronic kidney disease: Secondary | ICD-10-CM | POA: Diagnosis not present

## 2024-01-01 DIAGNOSIS — E1122 Type 2 diabetes mellitus with diabetic chronic kidney disease: Secondary | ICD-10-CM | POA: Diagnosis not present

## 2024-01-18 DIAGNOSIS — N200 Calculus of kidney: Secondary | ICD-10-CM | POA: Diagnosis not present

## 2024-01-18 DIAGNOSIS — R300 Dysuria: Secondary | ICD-10-CM | POA: Diagnosis not present

## 2024-01-18 DIAGNOSIS — N401 Enlarged prostate with lower urinary tract symptoms: Secondary | ICD-10-CM | POA: Diagnosis not present

## 2024-01-18 DIAGNOSIS — R3916 Straining to void: Secondary | ICD-10-CM | POA: Diagnosis not present

## 2024-01-18 DIAGNOSIS — K5730 Diverticulosis of large intestine without perforation or abscess without bleeding: Secondary | ICD-10-CM | POA: Diagnosis not present

## 2024-01-18 DIAGNOSIS — M545 Low back pain, unspecified: Secondary | ICD-10-CM | POA: Diagnosis not present

## 2024-01-18 DIAGNOSIS — R162 Hepatomegaly with splenomegaly, not elsewhere classified: Secondary | ICD-10-CM | POA: Diagnosis not present

## 2024-01-27 ENCOUNTER — Ambulatory Visit: Payer: Medicare PPO | Attending: Cardiology | Admitting: Cardiology

## 2024-01-27 ENCOUNTER — Encounter: Payer: Self-pay | Admitting: Cardiology

## 2024-01-27 VITALS — BP 102/62 | HR 66 | Ht 78.0 in | Wt 239.4 lb

## 2024-01-27 DIAGNOSIS — I25118 Atherosclerotic heart disease of native coronary artery with other forms of angina pectoris: Secondary | ICD-10-CM | POA: Diagnosis not present

## 2024-01-27 DIAGNOSIS — E782 Mixed hyperlipidemia: Secondary | ICD-10-CM | POA: Diagnosis not present

## 2024-01-27 DIAGNOSIS — I10 Essential (primary) hypertension: Secondary | ICD-10-CM | POA: Diagnosis not present

## 2024-01-27 NOTE — Progress Notes (Signed)
 Clinical Summary Craig Taylor is a 83 y.o.male seen today for follow up of the following medical problems.      1. CAD - history of RCA stent in 2003 - admitted 11/2020 with unstable angina - 11/2020 cath: 60% LM with 99% branching into osital LCX, ostial LAD 45%, LCX mild disease, RCA mid 70% 11/2020 echo: LVEF 55-60%, grade I dd, normal RV  - 11/26/20 CABG x3 with LIMA-LAD, SVG-PDA, SVG-OM     - denies any chest pains. Mild SOB with activities, very transient. Remains physically actice, cutting wood etc.  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 HTN -compliant with meds  4. Hyperlipidemia   - lipitor caused itching. Has been on simvastatin    11/2020 TC 105 TG 131 LDL 54 HDL 25 - 12/2023 TC 161 TG 95 HDL 31 LDL 67       SH: has RV goes out camping 3-4 times per year. Recent trip to [employer] [place] .  Past Medical History:  Diagnosis Date   Benign localized prostatic hyperplasia with lower urinary tract symptoms (LUTS)    Bladder cancer [facility])    urologist--- dr winter---  s/p  TURBT 09/ 2020   CKD (chronic kidney disease), stage III (HCC)    neprologist--- dr Lucia Russian  (12-15-2019  per pt was released to pcp at [facility] 02-06-2018 scanned in epic under media   Coronary artery disease cardiologist--- dr Wanetta Guthrie   hx MI in 2003--- 08-02-2002  s/p cardiac cath with PCI and DES to [facility] and PDA;  cath 10-04-2002 patent stents w/ 30-40% AV groove Cx with ef 50% and mild inferior hypokinesis   COVID-19 12/16/2019   asymptomatic   Full dentures    Glaucoma, right eye    History of basal cell carcinoma (BCC) excision    History of gout    per pt one episode 01/ 2021    History of kidney stones    History of MI (myocardial infarction) 08/01/2002   History of ulcer of large intestine    per pt approx. 2002 told due to oral voltaren  , colonoscopy/ egd with no intervention,  but did have blood  transfusions   Hyperlipidemia    Hypertension    followed by pcp   (nuclear stress test in epic 05-11-2018  low risk w/ no ischemia, nuclear ef 55%)   IDA (iron deficiency anemia)    Nephrolithiasis    Non-insulin  dependent type 2 diabetes mellitus (HCC)    followed by pcp  (12-15-2019   per pt checks blood sugar 3 times a week,  fasting sugar-- 120-140)   S/P drug eluting coronary stent placement 08/02/2002   to proximal RCA and PDA   Wears glasses      Allergies  Allergen Reactions   Lipitor [Atorvastatin ] Other (See Comments)    pruritis   Voltaren  [Diclofenac  Sodium] Other (See Comments)    caused intestine ulcers with bleeding   Penicillins Rash    Has patient had a PCN reaction causing immediate rash, facial/tongue/throat swelling, SOB or lightheadedness with hypotension: No Has patient had a PCN reaction causing severe rash involving mucus membranes or skin necrosis: No Has patient had a PCN reaction that required hospitalization: No Has patient had a PCN reaction occurring within the last 10 years: No  Reaction from injection as child- extensive rash per patient  Current Outpatient Medications  Medication Sig Dispense Refill   acetaminophen  (TYLENOL ) 500 MG tablet Take 500 mg by mouth every 6 (six) hours as needed for mild pain or headache.     aspirin  EC 81 MG tablet Take 81 mg by mouth at bedtime.     enalapril  (VASOTEC ) 2.5 MG tablet Take 2.5 mg by mouth at bedtime.     isosorbide  mononitrate (IMDUR ) 30 MG 24 hr tablet TAKE 1 TABLET BY MOUTH DAILY. 90 tablet 1   metFORMIN (GLUCOPHAGE) 500 MG tablet Take 1,000 mg by mouth 2 (two) times daily with a meal.     metoprolol  tartrate (LOPRESSOR ) 50 MG tablet TAKE (1) TABLET BY MOUTH TWICE DAILY. 180 tablet 3   nitroGLYCERIN  (NITROSTAT ) 0.4 MG SL tablet Place 0.4 mg under the tongue every 5 (five) minutes x 3 doses as needed.     Omega 3-6-9 Fatty Acids (OMEGA 3-6-9 COMPLEX PO) Take 1 capsule by mouth daily.      simvastatin  (ZOCOR ) 40 MG tablet Take 40 mg by mouth at bedtime.     tamsulosin  (FLOMAX ) 0.4 MG CAPS capsule Take 0.4 mg by mouth in the morning and at bedtime.     No current facility-administered medications for this visit.     Past Surgical History:  Procedure Laterality Date   BIOPSY  07/13/2018   Procedure: BIOPSY;  Surgeon: Suzette Espy, MD;  Location: [facility];  Service: Endoscopy;;  (gastric)   CARDIAC CATHETERIZATION  08/02/2002   dr berry   Crux of RCA 95% stenosis, stented with a 2.5x24mm CYPHER stent resulting in reduction of 95% stenosis to 0% residual; overlapping CYPHER stents -3x1mm and 3x26mm- deployed at 15atm resulting in reduction of 70% stenosis to 0% residual.   CARDIAC CATHETERIZATION  10/04/2002    dr berry   patent stents w/ 30-40% AV groove LCx , mild inferior hypokinesis, ef 50%   COLONOSCOPY N/A 05/31/2014   Dr. Riley Cheadle: Normal   COLONOSCOPY N/A 07/13/2018   Procedure: COLONOSCOPY;  Surgeon: Suzette Espy, MD;  Location: [facility];  Service: Endoscopy;  Laterality: N/A;  8:30am   CORONARY ARTERY BYPASS GRAFT N/A 11/26/2020   Procedure: CORONARY ARTERY BYPASS GRAFTING (CABG) TIMES 3 ON PUMP USING LEFT INTERNAL MAMMARY ARTERY AND ENDOSCOPICALLY HARVESTED RIGHT GREATER SAPHENOUS VEIN;  Surgeon: Rudine Cos, MD;  Location: [facility] OR;  Service: Open Heart Surgery;  Laterality: N/A;   ENDOVEIN HARVEST OF GREATER SAPHENOUS VEIN Right 11/26/2020   Procedure: ENDOVEIN HARVEST OF GREATER SAPHENOUS VEIN;  Surgeon: Rudine Cos, MD;  Location: [facility] OR;  Service: Open Heart Surgery;  Laterality: Right;   ESOPHAGOGASTRODUODENOSCOPY N/A 07/13/2018   Procedure: ESOPHAGOGASTRODUODENOSCOPY (EGD);  Surgeon: Suzette Espy, MD;  Location: [facility];  Service: Endoscopy;  Laterality: N/A;   LEFT HEART CATH AND CORONARY ANGIOGRAPHY N/A 11/22/2020   Procedure: LEFT HEART CATH AND CORONARY ANGIOGRAPHY;  Surgeon: Arleen Lacer, MD;  Location[facility] INVASIVE CV LAB;   Service: Cardiovascular;  Laterality: N/A;   TEE WITHOUT CARDIOVERSION N/A 11/26/2020   Procedure: TRANSESOPHAGEAL ECHOCARDIOGRAM (TEE);  Surgeon: Rudine Cos, MD;  Location: [facility] OR;  Service: Open Heart Surgery;  Laterality: N/A;   TRANSURETHRAL RESECTION OF BLADDER TUMOR N/A 04/22/2019   Procedure: TRANSURETHRAL RESECTION OF BLADDER TUMOR (TURBT)/ CYSTOSCOPY BILATERAL RETROGRADE PYELOGRAMS,  GEMCITABINE  INSTILLATION;  Surgeon: Adelbert Homans, MD;  Location: [facility];  Service: Urology;  Laterality: N/A;  ONLY NEEDS 45 MIN   TRANSURETHRAL RESECTION OF BLADDER  TUMOR N/A 01/25/2020   Procedure: TRANSURETHRAL RESECTION OF BLADDER TUMOR (TURBT)/ CYSTOSCOPY;  Surgeon: Adelbert Homans, MD;  Location: [facility];  Service: Urology;  Laterality: N/A;  ONLY NEEDS 30 MIN     Allergies  Allergen Reactions   Lipitor [Atorvastatin ] Other (See Comments)    pruritis   Voltaren  [Diclofenac  Sodium] Other (See Comments)    caused intestine ulcers with bleeding   Penicillins Rash    Has patient had a PCN reaction causing immediate rash, facial/tongue/throat swelling, SOB or lightheadedness with hypotension: No Has patient had a PCN reaction causing severe rash involving mucus membranes or skin necrosis: No Has patient had a PCN reaction that required hospitalization: No Has patient had a PCN reaction occurring within the last 10 years: No  Reaction from injection as child- extensive rash per patient      Family History  Problem Relation Age of Onset   Stroke Mother    Diabetes Mother    Colon cancer Neg Hx      Social History Mr. Kosta reports that he quit smoking about 40 years ago. His smoking use included cigarettes. He started smoking about 66 years ago. He has a 52.2 pack-year smoking history. He quit smokeless tobacco use about 11 years ago.  His smokeless tobacco use included chew. Mr. Geers reports no history of alcohol  use.      Physical Examination Today's Vitals   01/27/24 1311  BP: 102/62  Pulse: 66  SpO2: 97%  Weight: 239 lb 6.4 oz (108.6 kg)  Height: 6' 6 (1.981 m)   Body mass index is 27.67 kg/m.  Gen: resting comfortably, no acute distress HEENT: no scleral icterus, pupils equal round and reactive, no palptable cervical adenopathy,  CV: RRR, no m/rg, no jvd Resp: Clear to auscultation bilaterally GI: abdomen is soft, non-tender, non-distended, normal bowel sounds, no hepatosplenomegaly MSK: extremities are warm, no edema.  Skin: warm, no rash Neuro:  no focal deficits Psych: appropriate affect   Diagnostic Studies 05/11/18:              Blood pressure demonstrated a hypertensive response to exercise.            There was no ST segment deviation noted during stress.            Defect 1: There is a medium defect of moderate severity present in the mid inferoseptal, mid inferior and apical inferior location. This is consistent with myocardial scar as well as overlying soft tissue attenuation. There are no significant ischemic territories.            This is a low risk study.            Nuclear stress EF: 55%.     11/2020 cath            LV end diastolic pressure is normal.            There is no aortic valve stenosis.            Prox RCA to Mid RCA lesion is 10% stenosed.            Mid RCA lesion is 70% stenosed.            Dist RCA lesion is 60% stenosed.            Mid LM to Ost LAD lesion is 60% stenosed with 99% stenosed side Joram Venson in Ost Cx.  Ost LAD to Mid LAD lesion is 45% stenosed.   SUMMARY            Severe multivessel CAD:            Distal left main 55-60% involving 99% ostial LCx and ostial high OM1/RI; LCx otherwise has mild proximal and mid disease terminating as major branching lateral a OM Ishana Blades            40% proximal LAD with relatively minimal disease throughout the remainder the LAD and major 1st Diag            Tandem mid and  distal RCA lesions of 70% and 60%            Normal EDP.  Normal EF by echo     POST CATH RECOMMENDATIONS 1.         Return to nursing unit for ongoing post-cath care.   2.         Restart IV heparin  8 hours post sheath removal. 3.         Patient has surgical disease with the distal left main and ostial LCx.  Will consult CVTS in the morning. ?          PCI options would be high risk given the sharp angle takeoff of the circumflex, calcified ostial lesion with a sidebranch (high OM/RI).  Would need to stent across the LCx into the LAD as well (DK crush versus DK culotte technique would be required)            Continue aggressive risk modification.     11/2020 echo IMPRESSIONS       1. Left ventricular ejection fraction, by estimation, is 55 to 60%. The  left ventricle has normal function. The left ventricle has no regional  wall motion abnormalities. Left ventricular diastolic parameters are  consistent with Grade I diastolic  dysfunction (impaired relaxation). The average left ventricular global  longitudinal strain is 20.1 %. The global longitudinal strain is normal.   2. Right ventricular systolic function is normal. The right ventricular  size is normal. There is normal pulmonary artery systolic pressure. The  estimated right ventricular systolic pressure is 26.4 mmHg.   3. The mitral valve is normal in structure. No evidence of mitral valve  regurgitation. No evidence of mitral stenosis.   4. The aortic valve is tricuspid. Aortic valve regurgitation is not  visualized. No aortic stenosis is present.   5. Aortic dilatation noted. There is mild dilatation of the ascending  aorta, measuring 37 mm.   6. The inferior vena cava is normal in size with greater than 50%  respiratory variability, suggesting right atrial pressure of 3 mmHg.             Assessment and Plan   1. CAD with stable angina -no recent symptoms, continue current meds - EKG SR, trifascicular block, no  acute ischemic chnages   2. HTN -bp at goal, continue current meds   3. Hyperlipidemia - at goal, continue current meds  F/u 6 months     Laurann Pollock, M.D.

## 2024-01-27 NOTE — Patient Instructions (Signed)
 Medication Instructions:  Continue all current medications.   Labwork: none  Testing/Procedures: none  Follow-Up: 6 months   Any Other Special Instructions Will Be Listed Below (If Applicable).   If you need a refill on your cardiac medications before your next appointment, please call your pharmacy.

## 2024-02-08 DIAGNOSIS — I129 Hypertensive chronic kidney disease with stage 1 through stage 4 chronic kidney disease, or unspecified chronic kidney disease: Secondary | ICD-10-CM | POA: Diagnosis not present

## 2024-02-08 DIAGNOSIS — I10 Essential (primary) hypertension: Secondary | ICD-10-CM | POA: Diagnosis not present

## 2024-02-08 DIAGNOSIS — E1122 Type 2 diabetes mellitus with diabetic chronic kidney disease: Secondary | ICD-10-CM | POA: Diagnosis not present

## 2024-02-08 DIAGNOSIS — I2510 Atherosclerotic heart disease of native coronary artery without angina pectoris: Secondary | ICD-10-CM | POA: Diagnosis not present

## 2024-03-30 DIAGNOSIS — M25551 Pain in right hip: Secondary | ICD-10-CM | POA: Diagnosis not present

## 2024-04-13 ENCOUNTER — Other Ambulatory Visit (HOSPITAL_COMMUNITY): Payer: Self-pay | Admitting: Otolaryngology

## 2024-04-13 DIAGNOSIS — M25551 Pain in right hip: Secondary | ICD-10-CM

## 2024-04-14 DIAGNOSIS — Z207 Contact with and (suspected) exposure to pediculosis, acariasis and other infestations: Secondary | ICD-10-CM | POA: Diagnosis not present

## 2024-04-14 DIAGNOSIS — I10 Essential (primary) hypertension: Secondary | ICD-10-CM | POA: Diagnosis not present

## 2024-04-14 DIAGNOSIS — Z1331 Encounter for screening for depression: Secondary | ICD-10-CM | POA: Diagnosis not present

## 2024-04-14 DIAGNOSIS — N401 Enlarged prostate with lower urinary tract symptoms: Secondary | ICD-10-CM | POA: Diagnosis not present

## 2024-04-14 DIAGNOSIS — I129 Hypertensive chronic kidney disease with stage 1 through stage 4 chronic kidney disease, or unspecified chronic kidney disease: Secondary | ICD-10-CM | POA: Diagnosis not present

## 2024-04-14 DIAGNOSIS — Z6829 Body mass index (BMI) 29.0-29.9, adult: Secondary | ICD-10-CM | POA: Diagnosis not present

## 2024-04-14 DIAGNOSIS — E6609 Other obesity due to excess calories: Secondary | ICD-10-CM | POA: Diagnosis not present

## 2024-04-14 DIAGNOSIS — D509 Iron deficiency anemia, unspecified: Secondary | ICD-10-CM | POA: Diagnosis not present

## 2024-04-14 DIAGNOSIS — Z0001 Encounter for general adult medical examination with abnormal findings: Secondary | ICD-10-CM | POA: Diagnosis not present

## 2024-04-14 DIAGNOSIS — E1122 Type 2 diabetes mellitus with diabetic chronic kidney disease: Secondary | ICD-10-CM | POA: Diagnosis not present

## 2024-04-15 ENCOUNTER — Encounter (HOSPITAL_COMMUNITY): Payer: Self-pay

## 2024-04-15 ENCOUNTER — Ambulatory Visit (HOSPITAL_COMMUNITY)

## 2024-05-10 DIAGNOSIS — H401112 Primary open-angle glaucoma, right eye, moderate stage: Secondary | ICD-10-CM | POA: Diagnosis not present

## 2024-08-08 ENCOUNTER — Telehealth: Payer: Self-pay | Admitting: Cardiology

## 2024-08-08 NOTE — Telephone Encounter (Signed)
 Per encounter note from 01/2024- Simvastatin  has not been discontinued. Spoke to patient who verbalized understanding.   Pt stated that he has heard bad things about statin medications. Pt stated that he is concerned about his forgetfulness and believes this is coming from being on a statin medication. Also, pt c/o his leg falling asleep and also believes this is coming from the statin. I advised pt I did not believe these were 2 common side effects of medication, but assured patient I would send a message to PharmD and provider to advise.

## 2024-08-08 NOTE — Telephone Encounter (Signed)
 Pt c/o medication issue:  1. Name of Medication:   simvastatin  (ZOCOR ) 40 MG tablet    2. How are you currently taking this medication (dosage and times per day)?   3. Are you having a reaction (difficulty breathing--STAT)? No  4. What is your medication issue? Pt would like a c/b as to whether he is to still be taking above medication. Please advise

## 2024-08-09 NOTE — Telephone Encounter (Signed)
 I relayed pharmacist message to wife and she thanked us  for the call.

## 2024-08-09 NOTE — Telephone Encounter (Signed)
 Patient has history of MI and CABG x 3, recommend he continue statin as benefits outweigh risks

## 2024-09-13 ENCOUNTER — Encounter: Payer: Self-pay | Admitting: Cardiology

## 2024-09-13 ENCOUNTER — Ambulatory Visit: Admitting: Cardiology

## 2024-09-13 VITALS — BP 110/70 | HR 82 | Ht 78.0 in | Wt 245.4 lb

## 2024-09-13 DIAGNOSIS — I25118 Atherosclerotic heart disease of native coronary artery with other forms of angina pectoris: Secondary | ICD-10-CM | POA: Diagnosis not present

## 2024-09-13 DIAGNOSIS — E782 Mixed hyperlipidemia: Secondary | ICD-10-CM | POA: Diagnosis not present

## 2024-09-13 DIAGNOSIS — I10 Essential (primary) hypertension: Secondary | ICD-10-CM

## 2024-09-13 MED ORDER — ISOSORBIDE MONONITRATE ER 30 MG PO TB24: 45.0000 mg | ORAL_TABLET | Freq: Every day | ORAL | 6 refills | Status: AC

## 2024-09-13 NOTE — Patient Instructions (Signed)
Medication Instructions:   Increase Imdur to 45mg  daily.   Continue all other medications.    Labwork: none  Testing/Procedures: none  Follow-Up: 3 months   Any Other Special Instructions Will Be Listed Below (If Applicable).  If you need a refill on your cardiac medications before your next appointment, please call your pharmacy.

## 2024-09-14 ENCOUNTER — Encounter: Payer: Self-pay | Admitting: *Deleted

## 2024-12-20 ENCOUNTER — Ambulatory Visit: Admitting: Cardiology
